# Patient Record
Sex: Male | Born: 1935
Health system: Southern US, Community
[De-identification: ages and names within clinical notes are randomized; demographics above are authoritative.]

## PROBLEM LIST (undated history)

## (undated) DIAGNOSIS — I10 Essential (primary) hypertension: Secondary | ICD-10-CM

## (undated) DIAGNOSIS — G4733 Obstructive sleep apnea (adult) (pediatric): Secondary | ICD-10-CM

## (undated) DIAGNOSIS — I456 Pre-excitation syndrome: Secondary | ICD-10-CM

## (undated) DIAGNOSIS — Z860100 Personal history of colon polyps, unspecified: Secondary | ICD-10-CM

## (undated) DIAGNOSIS — G3184 Mild cognitive impairment, so stated: Secondary | ICD-10-CM

## (undated) DIAGNOSIS — L719 Rosacea, unspecified: Secondary | ICD-10-CM

## (undated) DIAGNOSIS — Z8546 Personal history of malignant neoplasm of prostate: Secondary | ICD-10-CM

## (undated) DIAGNOSIS — G250 Essential tremor: Secondary | ICD-10-CM

## (undated) HISTORY — DX: Rosacea, unspecified: L71.9

## (undated) HISTORY — DX: Personal history of colon polyps, unspecified: Z86.0100

## (undated) HISTORY — DX: Mild cognitive impairment of uncertain or unknown etiology: G31.84

## (undated) HISTORY — DX: Personal history of colonic polyps: Z86.010

## (undated) HISTORY — DX: Pre-excitation syndrome: I45.6

## (undated) HISTORY — DX: Essential (primary) hypertension: I10

## (undated) HISTORY — DX: Personal history of malignant neoplasm of prostate: Z85.46

## (undated) HISTORY — DX: Essential tremor: G25.0

## (undated) HISTORY — PX: TONSILLECTOMY: SHX5217

## (undated) HISTORY — DX: Obstructive sleep apnea (adult) (pediatric): G47.33

---

## 1968-04-23 DIAGNOSIS — I456 Pre-excitation syndrome: Secondary | ICD-10-CM

## 1968-04-23 HISTORY — DX: Pre-excitation syndrome: I45.6

## 2005-10-03 ENCOUNTER — Encounter: Payer: Self-pay | Admitting: Internal Medicine

## 2006-03-19 ENCOUNTER — Encounter: Payer: Self-pay | Admitting: Internal Medicine

## 2007-05-25 ENCOUNTER — Encounter: Payer: Self-pay | Admitting: Internal Medicine

## 2007-06-09 ENCOUNTER — Ambulatory Visit: Payer: Self-pay | Admitting: Internal Medicine

## 2007-06-09 DIAGNOSIS — Z8546 Personal history of malignant neoplasm of prostate: Secondary | ICD-10-CM

## 2007-06-09 DIAGNOSIS — I456 Pre-excitation syndrome: Secondary | ICD-10-CM | POA: Insufficient documentation

## 2007-06-09 DIAGNOSIS — G4733 Obstructive sleep apnea (adult) (pediatric): Secondary | ICD-10-CM

## 2007-06-09 DIAGNOSIS — L719 Rosacea, unspecified: Secondary | ICD-10-CM | POA: Insufficient documentation

## 2007-06-09 DIAGNOSIS — I10 Essential (primary) hypertension: Secondary | ICD-10-CM | POA: Insufficient documentation

## 2007-06-12 LAB — CONVERTED CEMR LAB
CO2: 30 meq/L (ref 19–32)
Eosinophils Absolute: 0.1 10*3/uL (ref 0.0–0.6)
Eosinophils Relative: 1.5 % (ref 0.0–5.0)
Glucose, Bld: 90 mg/dL (ref 70–99)
HCT: 42.5 % (ref 39.0–52.0)
Hemoglobin: 14.5 g/dL (ref 13.0–17.0)
Lymphocytes Relative: 30 % (ref 12.0–46.0)
MCV: 95.5 fL (ref 78.0–100.0)
Neutro Abs: 3.1 10*3/uL (ref 1.4–7.7)
Neutrophils Relative %: 56 % (ref 43.0–77.0)
Phosphorus: 3.8 mg/dL (ref 2.3–4.6)
Potassium: 3.8 meq/L (ref 3.5–5.1)
WBC: 5.6 10*3/uL (ref 4.5–10.5)

## 2007-08-14 ENCOUNTER — Telehealth (INDEPENDENT_AMBULATORY_CARE_PROVIDER_SITE_OTHER): Payer: Self-pay | Admitting: *Deleted

## 2007-08-20 ENCOUNTER — Ambulatory Visit: Payer: Self-pay | Admitting: Internal Medicine

## 2007-09-03 ENCOUNTER — Ambulatory Visit: Payer: Self-pay | Admitting: Gastroenterology

## 2007-09-17 ENCOUNTER — Encounter: Payer: Self-pay | Admitting: Gastroenterology

## 2007-09-17 ENCOUNTER — Ambulatory Visit: Payer: Self-pay | Admitting: Gastroenterology

## 2007-09-17 LAB — HM COLONOSCOPY

## 2007-09-18 ENCOUNTER — Encounter: Payer: Self-pay | Admitting: Gastroenterology

## 2007-10-20 ENCOUNTER — Telehealth: Payer: Self-pay | Admitting: Family Medicine

## 2007-10-31 ENCOUNTER — Telehealth (INDEPENDENT_AMBULATORY_CARE_PROVIDER_SITE_OTHER): Payer: Self-pay | Admitting: *Deleted

## 2007-11-18 ENCOUNTER — Telehealth: Payer: Self-pay | Admitting: Internal Medicine

## 2007-12-04 ENCOUNTER — Telehealth: Payer: Self-pay | Admitting: Family Medicine

## 2007-12-15 ENCOUNTER — Telehealth (INDEPENDENT_AMBULATORY_CARE_PROVIDER_SITE_OTHER): Payer: Self-pay | Admitting: *Deleted

## 2007-12-22 ENCOUNTER — Ambulatory Visit: Payer: Self-pay | Admitting: Internal Medicine

## 2008-01-16 ENCOUNTER — Telehealth (INDEPENDENT_AMBULATORY_CARE_PROVIDER_SITE_OTHER): Payer: Self-pay | Admitting: *Deleted

## 2008-01-29 ENCOUNTER — Ambulatory Visit: Payer: Self-pay | Admitting: Family Medicine

## 2008-02-11 ENCOUNTER — Encounter: Payer: Self-pay | Admitting: Family Medicine

## 2008-02-11 ENCOUNTER — Encounter: Payer: Self-pay | Admitting: Internal Medicine

## 2008-03-31 ENCOUNTER — Telehealth: Payer: Self-pay | Admitting: Internal Medicine

## 2008-05-10 ENCOUNTER — Telehealth: Payer: Self-pay | Admitting: Internal Medicine

## 2008-05-12 ENCOUNTER — Encounter: Payer: Self-pay | Admitting: Internal Medicine

## 2008-05-19 ENCOUNTER — Ambulatory Visit: Payer: Self-pay | Admitting: Internal Medicine

## 2008-05-19 DIAGNOSIS — F01B0 Vascular dementia, moderate, without behavioral disturbance, psychotic disturbance, mood disturbance, and anxiety: Secondary | ICD-10-CM | POA: Insufficient documentation

## 2008-05-19 DIAGNOSIS — F01518 Vascular dementia, unspecified severity, with other behavioral disturbance: Secondary | ICD-10-CM | POA: Insufficient documentation

## 2008-05-19 DIAGNOSIS — F0150 Vascular dementia without behavioral disturbance: Secondary | ICD-10-CM

## 2008-05-19 DIAGNOSIS — G250 Essential tremor: Secondary | ICD-10-CM

## 2008-06-17 ENCOUNTER — Telehealth: Payer: Self-pay | Admitting: Internal Medicine

## 2008-06-24 ENCOUNTER — Ambulatory Visit: Payer: Self-pay | Admitting: Internal Medicine

## 2008-07-05 ENCOUNTER — Telehealth: Payer: Self-pay | Admitting: Family Medicine

## 2008-08-06 ENCOUNTER — Telehealth: Payer: Self-pay | Admitting: Internal Medicine

## 2008-09-06 ENCOUNTER — Encounter: Payer: Self-pay | Admitting: Internal Medicine

## 2008-09-06 ENCOUNTER — Telehealth: Payer: Self-pay | Admitting: Internal Medicine

## 2008-09-16 ENCOUNTER — Ambulatory Visit: Payer: Self-pay | Admitting: Internal Medicine

## 2008-09-17 LAB — CONVERTED CEMR LAB
AST: 33 units/L (ref 0–37)
Alkaline Phosphatase: 68 units/L (ref 39–117)
Basophils Absolute: 0 10*3/uL (ref 0.0–0.1)
Basophils Relative: 0.5 % (ref 0.0–3.0)
Bilirubin, Direct: 0.1 mg/dL (ref 0.0–0.3)
Calcium: 9.4 mg/dL (ref 8.4–10.5)
Chloride: 106 meq/L (ref 96–112)
Creatinine, Ser: 1.1 mg/dL (ref 0.4–1.5)
Eosinophils Absolute: 0.2 10*3/uL (ref 0.0–0.7)
Glucose, Bld: 97 mg/dL (ref 70–99)
Lymphocytes Relative: 32.4 % (ref 12.0–46.0)
MCHC: 35.4 g/dL (ref 30.0–36.0)
Monocytes Relative: 9.3 % (ref 3.0–12.0)
Neutrophils Relative %: 55.2 % (ref 43.0–77.0)
Phosphorus: 4.2 mg/dL (ref 2.3–4.6)
RBC: 4.18 M/uL — ABNORMAL LOW (ref 4.22–5.81)
RDW: 13.4 % (ref 11.5–14.6)
Vitamin B-12: 612 pg/mL (ref 211–911)

## 2008-10-11 ENCOUNTER — Ambulatory Visit: Payer: Self-pay | Admitting: Internal Medicine

## 2008-11-11 ENCOUNTER — Telehealth: Payer: Self-pay | Admitting: Internal Medicine

## 2009-01-05 ENCOUNTER — Ambulatory Visit: Payer: Self-pay | Admitting: Otolaryngology

## 2009-01-14 ENCOUNTER — Telehealth: Payer: Self-pay | Admitting: Internal Medicine

## 2009-03-07 ENCOUNTER — Telehealth: Payer: Self-pay | Admitting: Internal Medicine

## 2009-04-04 ENCOUNTER — Ambulatory Visit: Payer: Self-pay | Admitting: Internal Medicine

## 2009-04-27 ENCOUNTER — Encounter: Payer: Self-pay | Admitting: Internal Medicine

## 2009-05-02 ENCOUNTER — Telehealth: Payer: Self-pay | Admitting: Internal Medicine

## 2009-05-13 ENCOUNTER — Encounter: Payer: Self-pay | Admitting: Internal Medicine

## 2009-05-26 ENCOUNTER — Ambulatory Visit: Payer: Self-pay | Admitting: Internal Medicine

## 2009-06-29 ENCOUNTER — Telehealth: Payer: Self-pay | Admitting: Family Medicine

## 2009-07-01 ENCOUNTER — Ambulatory Visit: Payer: Self-pay | Admitting: Family Medicine

## 2009-07-11 ENCOUNTER — Encounter: Payer: Self-pay | Admitting: Family Medicine

## 2009-08-03 ENCOUNTER — Encounter: Payer: Self-pay | Admitting: Family Medicine

## 2009-08-09 ENCOUNTER — Telehealth: Payer: Self-pay | Admitting: Internal Medicine

## 2009-09-26 ENCOUNTER — Ambulatory Visit: Payer: Self-pay | Admitting: Internal Medicine

## 2009-09-26 DIAGNOSIS — L570 Actinic keratosis: Secondary | ICD-10-CM

## 2009-12-02 ENCOUNTER — Telehealth: Payer: Self-pay | Admitting: Internal Medicine

## 2010-02-03 ENCOUNTER — Ambulatory Visit: Payer: Self-pay | Admitting: Internal Medicine

## 2010-02-06 LAB — CONVERTED CEMR LAB
AST: 30 units/L (ref 0–37)
Albumin: 4 g/dL (ref 3.5–5.2)
BUN: 22 mg/dL (ref 6–23)
Basophils Absolute: 0 10*3/uL (ref 0.0–0.1)
CO2: 31 meq/L (ref 19–32)
Chloride: 99 meq/L (ref 96–112)
Eosinophils Absolute: 0.2 10*3/uL (ref 0.0–0.7)
HCT: 41.3 % (ref 39.0–52.0)
Hemoglobin: 14.3 g/dL (ref 13.0–17.0)
Lymphs Abs: 1.8 10*3/uL (ref 0.7–4.0)
MCHC: 34.7 g/dL (ref 30.0–36.0)
MCV: 97.9 fL (ref 78.0–100.0)
Monocytes Absolute: 1 10*3/uL (ref 0.1–1.0)
Neutro Abs: 3.8 10*3/uL (ref 1.4–7.7)
RDW: 14.3 % (ref 11.5–14.6)
TSH: 0.85 microintl units/mL (ref 0.35–5.50)

## 2010-05-23 NOTE — Progress Notes (Signed)
Summary: TRAZODONE  Phone Note Refill Request Message from:  [id number] on December 02, 2009 9:43 AM  Refills Requested: Medication #1:  TRAZODONE HCL 100 MG  TABS 2 nightly   Last Refilled: 11/02/2009 E-Scribe Request    Method Requested: Electronic Initial call taken by: Mervin Hack CMA Duncan Dull),  December 02, 2009 9:43 AM  Follow-up for Phone Call        okay to refill x 1 year Follow-up by: Cindee Salt MD,  December 02, 2009 1:21 PM  Additional Follow-up for Phone Call Additional follow up Details #1::        Rx faxed to pharmacy Additional Follow-up by: DeShannon Smith CMA [facility] Dull),  December 02, 2009 2:51 PM    Prescriptions: TRAZODONE HCL 100 MG  TABS (TRAZODONE HCL) 2 nightly  #60 x 12   Entered by:   Mervin Hack CMA (AAMA)   Authorized by:   Cindee Salt MD   Signed by:   Mervin Hack CMA (AAMA) on 12/02/2009   Method used:   Electronically to        [facility] [address]       [address], [address]       [place], [place]  [address]       Ph: [phone]       Fax: [phone]   RxID:   [id number]

## 2010-05-23 NOTE — Assessment & Plan Note (Signed)
Summary: LEFT LEG PAIN/CLE   Vital Signs:  Patient profile:   77 year old male Height:      67.5 inches Weight:      170.38 pounds BMI:     26.39 Temp:     98.0 degrees F oral Pulse rate:   40 / minute Pulse rhythm:   regular BP sitting:   138 / 62  (left arm) Cuff size:   regular  Vitals Entered By: Linde Gillis CMA [facility] Dull) (July 01, 2009 10:11 AM) CC: left leg pain   History of Present Illness: 77 year old male:  Left leg, lateral thigh.  Swims a lot, may have hyperextended his hip. No fall or trauma.   No prior fracture or operation  Walks his dog, walks over campus and will seldom Hard time walking his dog.  Pain with hip flexion  no groin pain  Allergies: 1)  Lisinopril (Lisinopril)  Past History:  Past medical, surgical, family and social histories (including risk factors) reviewed, and no changes noted (except as noted below).  Past Medical History: Reviewed history from 05/19/2008 and no changes required. Essential tremor Prostate cancer, hx of---RT 2006 Obstructive sleep apnea--on CPAP of 10 Wolff-Parkinson-White syndrome---diagnosed in 1970 Rosacea Hypertension Colonic polyps, hx of Mild cognitive impairment  Past Surgical History: Reviewed history from 06/09/2007 and no changes required. Tonsillectomy  Family History: Reviewed history from 12/22/2007 and no changes required. Dad died @54   MI, CVA. Had tremors Mom died @56  cancer 1 brother died of stroke @80  No aware of other medical history  Social History: Reviewed history from 06/09/2007 and no changes required. 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  Occ memoirs (has been published)  Has living will. Requested DNR so form completed. Requests wife as health care POA. Would accept feeding tube unless vegetative state  Review of Systems       REVIEW OF SYSTEMS  GEN: No systemic  complaints, no fevers, chills, sweats, or other acute illnesses MSK: Detailed in the HPI GI: tolerating PO intake without difficulty Neuro: No numbness, parasthesias, or tingling associated. Otherwise the pertinent positives of the ROS are noted above.    Physical Exam  General:  alert and normal appearance.   Ears:  no external deformities.   Nose:  no external deformity.   Lungs:  normal respiratory effort.   Msk:  right hip, full range of motion in all directions, excellent strength throughout. Nontender throughout on examination.  Left hip: Full range of motion without any groin pain with manipulation. Excellent abduction. No pain with pelvic manipulation. Nontender at the GTB. The external rotation resisted abduction and abduction is normal. Resisted abduction is normal. Nontender along gluteus medius and other  abductors. Nontender at the piriformis musculature.  There's isolated pain at the lateral hip flexors  and that  the TFL   Impression & Recommendations:  Problem # 1:  OTHER JOINT DERANGEMENT NEC PELVIC REGION&THIGH (ICD-718.85) >25 minutes spent in face to face time with patient, >50% spent in counselling or coordination of care: we reviewed the anatomy, rehabilitation process. I encouraged him to return to the pool, he can begin some  multidirectional rehabilitation and resistance in the water. Tivis Ringer have a graded return  to swimming, with very easy strokes  and beginning with something  such as a  backstroke  or other that  has limited  aggressive hip flexion.  I believe this is an isolated grade 1 muscle  injury obtained from  hyperextension injury. I expect he will do well.  The patient lives at [facility], asked their physical therapist to work along with him during his progress.  Orders: Physical Therapy Referral (PT)  Complete Medication List: 1)  Tetracycline Hcl 250 Mg Caps (Tetracycline hcl) .... Take 1 capsule by mouth once a day 2)  Chlorthalidone 25 Mg Tabs  (Chlorthalidone) .... Take 1 tablet by mouth once a day 3)  Lorazepam 0.5 Mg Tabs (Lorazepam) .... Take 1-2 tablets at bedtime as needed to help sleep 4)  Trazodone Hcl 100 Mg Tabs (Trazodone hcl) .... 2 nightly 5)  Adult Aspirin Low Strength 81 Mg Tbdp (Aspirin) .... Take 1 tablet by mouth once a day 6)  Mens Multivitamin Plus Tabs (Multiple vitamins-minerals) .... Take 1 by mouth once daily 7)  Inderal La 80 Mg Xr24h-cap (Propranolol hcl) .Marland Kitchen.. 1 daily 8)  Amlodipine Besylate 10 Mg Tabs (Amlodipine besylate) .Marland Kitchen.. 1 tab daily for high blood pressure  Patient Instructions: 1)  Referral Appointment Information 2)  Day/Date: 3)  Time: 4)  Place/MD: 5)  Address: 6)  Phone/Fax: 7)  Patient given appointment information. Information/Orders faxed/mailed.   Current Allergies (reviewed today): LISINOPRIL (LISINOPRIL)

## 2010-05-23 NOTE — Progress Notes (Signed)
Summary: LORAZEPAM  Phone Note Refill Request Message from:  [phone] on June 29, 2009 8:31 AM  Refills Requested: Medication #1:  LORAZEPAM 0.5 MG  TABS Take 1-2 tablets at bedtime as needed to help sleep   Last Refilled: 05/02/2009 E-Scribe Request for Dr. Alphonsus Sias pt.   Method Requested: Telephone to Pharmacy Initial call taken by: DeShannon Katrinka Blazing CMA [facility] Dull),  June 29, 2009 8:31 AM  Follow-up for Phone Call        Rx called to pharmacy Follow-up by: Mervin Hack CMA [facility] Dull),  June 29, 2009 1:12 PM     Prescriptions: LORAZEPAM 0.5 MG  TABS (LORAZEPAM) Take 1-2 tablets at bedtime as needed to help sleep  #60 x 0   Entered and Authorized by:   Shaune Leeks MD   Signed by:   Shaune Leeks MD on 06/29/2009   Method used:   Telephoned to ...       [facility] [address]       [address]       [place], [place]  [address]       Ph: [phone]       Fax: [phone]   RxID:   [id number]

## 2010-05-23 NOTE — Miscellaneous (Signed)
Summary: PT Orders & Initial Eval/[facility]  PT Orders & Initial Eval/[facility]   Imported By: Lanelle Bal 07/14/2009 13:01:48  _____________________________________________________________________  External Attachment:    Type:   Image     Comment:   External Document

## 2010-05-23 NOTE — Letter (Signed)
Summary: [facility] / NEUROPSYCHOLOGICAL RPT / DR. SARAH COOK  [facility] / NEUROPSYCHOLOGICAL RPT / DR. SARAH COOK   Imported By: Carin Primrose 06/07/2009 14:36:32  _____________________________________________________________________  External Attachment:    Type:   Image     Comment:   External Document

## 2010-05-23 NOTE — Letter (Signed)
Summary: [facility] NP/[facility]  [facility]   Imported By: Sherian Rein 05/13/2009 09:57:20  _____________________________________________________________________  External Attachment:    Type:   Image     Comment:   External Document  Appended Document: [facility] NP/[facility] mild memory loss checking neuropsych testing

## 2010-05-23 NOTE — Miscellaneous (Signed)
Summary: PT Orders/[facility]  PT Orders/[facility]   Imported By: Lanelle Bal 08/09/2009 13:29:23  _____________________________________________________________________  External Attachment:    Type:   Image     Comment:   External Document

## 2010-05-23 NOTE — Assessment & Plan Note (Signed)
Summary: 6 WK F/U DLO   Vital Signs:  Patient profile:   77 year old male Weight:      169 pounds Temp:     97.8 degrees F oral BP sitting:   120 / 68  (left arm) Cuff size:   large  Vitals Entered By: Mervin Hack CMA [facility] Dull) (May 26, 2009 3:47 PM) CC: 6 week follow-up   History of Present Illness: Doing well No problems with increased BP meds doesn't check his BP  No headaches No chest pain  No SOB Still swims regularly---45 minutes at a time Walks dog regularly still  Still with some memory problems No sig change he feels---wife not here yet Still have long cognitive testing at [facility] with Dr Yolanda Bonine Scott---results pending  Tremor well controlled on the meds  Allergies: 1)  Lisinopril (Lisinopril)  Past History:  Past medical, surgical, family and social histories (including risk factors) reviewed for relevance to current acute and chronic problems.  Past Medical History: Reviewed history from 05/19/2008 and no changes required. Essential tremor Prostate cancer, hx of---RT 2006 Obstructive sleep apnea--on CPAP of 10 Wolff-Parkinson-White syndrome---diagnosed in 1970 Rosacea Hypertension Colonic polyps, hx of Mild cognitive impairment  Past Surgical History: Reviewed history from 06/09/2007 and no changes required. Tonsillectomy  Family History: Reviewed history from 12/22/2007 and no changes required. Dad died @54   MI, CVA. Had tremors Mom died @56  cancer 1 brother died of stroke @80  No aware of other medical history  Social History: Reviewed history from 06/09/2007 and no changes required. 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  Occ memoirs (has been published)  Has living will. Requested DNR so form completed. Requests wife as health care POA. Would accept feeding tube unless vegetative state  Review of Systems       sleeping  well appetite is good  weight stable  Physical Exam  General:  alert and normal appearance.   Neck:  supple, no masses, no thyromegaly, no carotid bruits, and no cervical lymphadenopathy.   Lungs:  normal respiratory effort and normal breath sounds.   Heart:  normal rate, regular rhythm, no murmur, and no gallop.   Extremities:  no edema Neurologic:  tremor not evident at rest Psych:  normally interactive, good eye contact, not anxious appearing, and not depressed appearing.     Impression & Recommendations:  Problem # 1:  HYPERTENSION (ICD-401.9) Assessment Improved much better continue current meds  His updated medication list for this problem includes:    Chlorthalidone 25 Mg Tabs (Chlorthalidone) .Marland Kitchen... Take 1 tablet by mouth once a day    Inderal La 80 Mg Xr24h-cap (Propranolol hcl) .Marland Kitchen... 1 daily    Amlodipine Besylate 10 Mg Tabs (Amlodipine besylate) .Marland Kitchen... 1 tab daily for high blood pressure  BP today: 120/68 Prior BP: 200/80 (04/04/2009)  Labs Reviewed: K+: 3.9 (09/16/2008) Creat: : 1.1 (09/16/2008)     Problem # 2:  MILD COGNITIVE IMPAIRMENT SO STATED (ICD-331.83) Assessment: Comment Only will await results of recent testing  Problem # 3:  TREMOR, ESSENTIAL (ICD-333.1) Assessment: Unchanged good control  Complete Medication List: 1)  Tetracycline Hcl 250 Mg Caps (Tetracycline hcl) .... Take 1 capsule by mouth once a day 2)  Chlorthalidone 25 Mg Tabs (Chlorthalidone) .... Take 1 tablet by mouth once a day 3)  Lorazepam 0.5 Mg Tabs (Lorazepam) .... Take 1-2 tablets at bedtime as needed to help sleep 4)  Trazodone Hcl 100 Mg Tabs (  Trazodone hcl) .... 2 nightly 5)  Adult Aspirin Low Strength 81 Mg Tbdp (Aspirin) .... Take 1 tablet by mouth once a day 6)  Mens Multivitamin Plus Tabs (Multiple vitamins-minerals) .... Take 1 by mouth once daily 7)  Inderal La 80 Mg Xr24h-cap (Propranolol hcl) .Marland Kitchen.. 1 daily 8)  Amlodipine Besylate 10 Mg Tabs (Amlodipine besylate) .Marland Kitchen..  1 tab daily for high blood pressure  Patient Instructions: 1)  Please schedule a follow-up appointment in 4 months .   Current Allergies (reviewed today): LISINOPRIL (LISINOPRIL)

## 2010-05-23 NOTE — Assessment & Plan Note (Signed)
Summary: 4 month follow up/[facility]   Vital Signs:  Patient profile:   77 year old male Weight:      171 pounds Temp:     98.3 degrees F oral Pulse rate:   40 / minute BP sitting:   150 / 80 CC: follow-up visit, Hypertension Management   History of Present Illness: Left leg is all better Done with PT now back to normal walking without any problems  Has skin tags or warts on his back that have recently come up Posterior right shoulder no pain or itching  Memory is about the same did have full testing at [facility] mild cognitive impairment  uses lorazepam just for sleep no nerve problems generally satisfied new great granddaughter and another on the way  tremor is tolerable with current meds only problem is with signing  No dizziness or syncope no palpitation  Hypertension History:      Positive major cardiovascular risk factors include male age 28 years old or older and hypertension.  Negative major cardiovascular risk factors include non-tobacco-user status.    Allergies: 1)  Lisinopril (Lisinopril)  Past History:  Past medical, surgical, family and social histories (including risk factors) reviewed for relevance to current acute and chronic problems.  Past Medical History: Reviewed history from 05/19/2008 and no changes required. Essential tremor Prostate cancer, hx of---RT 2006 Obstructive sleep apnea--on CPAP of 10 Wolff-Parkinson-White syndrome---diagnosed in 1970 Rosacea Hypertension Colonic polyps, hx of Mild cognitive impairment  Past Surgical History: Reviewed history from 06/09/2007 and no changes required. Tonsillectomy  Family History: Reviewed history from 12/22/2007 and no changes required. Dad died @54   MI, CVA. Had tremors Mom died @56  cancer 1 brother died of stroke @80  No aware of other medical history  Social History: Reviewed history from 06/09/2007 and no changes required. 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  Occ memoirs (has been published)  Has living will. Requested DNR so form completed. Requests wife as health care POA. Would accept feeding tube unless vegetative state  Review of Systems       appetite is fine sleeping well weight stable ongoing hearing issues-uses aides  Physical Exam  General:  alert and normal appearance.   Neck:  supple, no masses, no thyromegaly, no carotid bruits, and no cervical lymphadenopathy.   Lungs:  normal respiratory effort and normal breath sounds.   Heart:  regular rhythm, no murmur, no gallop, and bradycardia.   Msk:  no joint tenderness and no joint swelling.   Extremities:  no edema Skin:  actinic on vertex of head cutaneous horn on posterior right shoulder and left flank skin tag left flank Psych:  normally interactive, good eye contact, not anxious appearing, and not depressed appearing.     Impression & Recommendations:  Problem # 1:  MILD COGNITIVE IMPAIRMENT SO STATED (ICD-331.83) Assessment Comment Only stable status no progression of cognitive problems mood fine bradycardia doesn't seem to influence  Problem # 2:  TREMOR, ESSENTIAL (ICD-333.1) Assessment: Comment Only mild symptoms only will try to wean propranolol due to marked bradycardia (upper 30's)  Problem # 3:  SLEEP DISORDER (ICD-780.50) Assessment: Unchanged will have him try without the lorazepam  Problem # 4:  HYPERTENSION (ICD-401.9) Assessment: Unchanged BP fine will decrease the inderal due to bradycardia  The following medications were removed from the medication list:    Inderal La 80 Mg Xr24h-cap (Propranolol hcl) .Marland Kitchen... 1 daily His updated medication list for  this problem includes:    Chlorthalidone 25 Mg Tabs (Chlorthalidone) .Marland Kitchen... Take 1 tablet by mouth once a day    Amlodipine Besylate 10 Mg Tabs (Amlodipine besylate) .Marland Kitchen... 1 tab daily for high blood pressure     Propranolol Hcl Cr 60 Mg Xr24h-cap (Propranolol hcl) .Marland Kitchen... 1 tab daily to control tremor  BP today: 150/80 Prior BP: 138/62 (07/01/2009)  10 Yr Risk Heart Disease: Not enough information  Labs Reviewed: K+: 3.9 (09/16/2008) Creat: : 1.1 (09/16/2008)     Problem # 5:  ACTINIC KERATOSIS (ICD-702.0) Assessment: New liquid nitrogen to the 4 lesions for 40 seconds x 2 tolerated well  Orders: Cryotherapy/Destruction benign or premalignant lesion (1st lesion)  (17000) Cryotherapy/Destruction benign or premalignant lesion (2nd-14th lesions) (17003)  Complete Medication List: 1)  Tetracycline Hcl 250 Mg Caps (Tetracycline hcl) .... Take 1 capsule by mouth once a day 2)  Chlorthalidone 25 Mg Tabs (Chlorthalidone) .... Take 1 tablet by mouth once a day 3)  Lorazepam 0.5 Mg Tabs (Lorazepam) .... Take 1-2 tablets at bedtime as needed to help sleep 4)  Trazodone Hcl 100 Mg Tabs (Trazodone hcl) .... 2 nightly 5)  Adult Aspirin Low Strength 81 Mg Tbdp (Aspirin) .... Take 1 tablet by mouth once a day 6)  Mens Multivitamin Plus Tabs (Multiple vitamins-minerals) .... Take 1 by mouth once daily 7)  Amlodipine Besylate 10 Mg Tabs (Amlodipine besylate) .Marland Kitchen.. 1 tab daily for high blood pressure 8)  Propranolol Hcl Cr 60 Mg Xr24h-cap (Propranolol hcl) .Marland Kitchen.. 1 tab daily to control tremor  Hypertension Assessment/Plan:      The patient's hypertensive risk group is category B: At least one risk factor (excluding diabetes) with no target organ damage.  Today's blood pressure is 150/80.     Patient Instructions: 1)  Please schedule a follow-up appointment in 4 months .  Prescriptions: PROPRANOLOL HCL CR 60 MG XR24H-CAP (PROPRANOLOL HCL) 1 tab daily to control tremor  #30 x 12   Entered and Authorized by:   Cindee Salt MD   Signed by:   Cindee Salt MD on 09/26/2009   Method used:   Electronically to        [facility] [address]       [address]        [place], [place]  [address]       Ph: [phone]       Fax: [phone]   RxID:   [id number]

## 2010-05-23 NOTE — Progress Notes (Signed)
Summary:  LORAZEPAM   Phone Note Refill Request Message from:  [phone] on August 09, 2009 3:33 PM  Refills Requested: Medication #1:  LORAZEPAM 0.5 MG  TABS Take 1-2 tablets at bedtime as needed to help sleep   Last Refilled: 06/29/2009 E-Scribe Request    Method Requested: Telephone to Pharmacy Initial call taken by: Mervin Hack CMA Duncan Dull),  August 09, 2009 3:34 PM  Follow-up for Phone Call        okay #60 x 2 Follow-up by: Cindee Salt MD,  August 10, 2009 8:11 AM  Additional Follow-up for Phone Call Additional follow up Details #1::        Rx called to pharmacy Additional Follow-up by: Linde Gillis CMA [facility] Dull),  August 10, 2009 12:59 PM    Prescriptions: LORAZEPAM 0.5 MG  TABS (LORAZEPAM) Take 1-2 tablets at bedtime as needed to help sleep  #60 x 2   Entered by:   Linde Gillis CMA (AAMA)   Authorized by:   Cindee Salt MD   Signed by:   Linde Gillis CMA (AAMA) on 08/10/2009   Method used:   Telephoned to ...       [facility] [address]       [address], [address]       [place], [place]  [address]       Ph: [phone]       Fax: [phone]   RxID:   [id number]

## 2010-05-23 NOTE — Assessment & Plan Note (Signed)
Summary: ROA FOR 4 MONTH FOLLOW-UP/JRR   Vital Signs:  Patient profile:   77 year old male Weight:      174 pounds Temp:     98.2 degrees F oral Pulse rate:   36 / minute Pulse rhythm:   irregularly irregular BP sitting:   128 / 60  (left arm) Cuff size:   large  Vitals Entered By: Mervin Hack CMA Duncan Dull) (February 03, 2010 10:26 AM) CC: 4 month follow-up   History of Present Illness: DOing well Tries to stay busy--walks a mile a day with dog and occ otherwise still swims laps regularly  Doesn't check BP no headaches No chest pain No SOB or change in exercise tolerance No edema  No sig memory changes Wife had not noted any serious change no loss of function  sleeping weill with just the trazodone hasn't needed the lorazepam   tremor has note been noticeable lately  Allergies: 1)  Lisinopril (Lisinopril)  Past History:  Past medical, surgical, family and social histories (including risk factors) reviewed for relevance to current acute and chronic problems.  Past Medical History: Reviewed history from 05/19/2008 and no changes required. Essential tremor Prostate cancer, hx of---RT 2006 Obstructive sleep apnea--on CPAP of 10 Wolff-Parkinson-White syndrome---diagnosed in 1970 Rosacea Hypertension Colonic polyps, hx of Mild cognitive impairment  Past Surgical History: Reviewed history from 06/09/2007 and no changes required. Tonsillectomy  Family History: Reviewed history from 12/22/2007 and no changes required. Dad died @54   MI, CVA. Had tremors Mom died @56  cancer 1 brother died of stroke @80  No aware of other medical history  Social History: Reviewed history from 06/09/2007 and no changes required. 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  Occ memoirs (has been published)  Has living will. Requested DNR so form completed. Requests wife as  health care POA. Would accept feeding tube unless vegetative state  Review of Systems       appeitte is good weight is up a few punds Just went up in a hot air balloon--really lved it   Physical Exam  General:  alert and normal appearance.   Neck:  supple, no masses, no thyromegaly, no carotid bruits, and no cervical lymphadenopathy.   Lungs:  normal respiratory effort, no intercostal retractions, no accessory muscle use, and normal breath sounds.   Heart:  normal rate, regular rhythm, and no gallop.   Gr 2/6 systolic murmur throughout precordium--sounds aortic Extremities:  no edema Neurologic:  alert & oriented X3, strength normal in all extremities, and gait normal.   Psych:  normally interactive, good eye contact, not anxious appearing, and not depressed appearing.     Impression & Recommendations:  Problem # 1:  MILD COGNITIVE IMPAIRMENT SO STATED (ICD-331.83) Assessment Unchanged stable no action needed  Problem # 2:  HYPERTENSION (ICD-401.9) Assessment: Unchanged  good control no changes needed  His updated medication list for this problem includes:    Chlorthalidone 25 Mg Tabs (Chlorthalidone) .Marland Kitchen... Take 1 tablet by mouth once a day    Amlodipine Besylate 10 Mg Tabs (Amlodipine besylate) .Marland Kitchen... 1 tab daily for high blood pressure    Propranolol Hcl Cr 60 Mg Xr24h-cap (Propranolol hcl) .Marland Kitchen... 1 tab daily to control tremor  BP today: 128/60 Prior BP: 150/80 (09/26/2009)  Prior 10 Yr Risk Heart Disease: Not enough information (09/26/2009)  Labs Reviewed: K+: 3.9 (09/16/2008) Creat: : 1.1 (09/16/2008)     Orders: TLB-Renal Function Panel (80069-RENAL) TLB-CBC Platelet -  w/Differential (85025-CBCD) TLB-Hepatic/Liver Function Pnl (80076-HEPATIC) TLB-TSH (Thyroid Stimulating Hormone) (84443-TSH) Venipuncture ([address])  Problem # 3:  TREMOR, ESSENTIAL (ICD-333.1) Assessment: Unchanged no recent problems on the propranolol  Problem # 4:  SLEEP DISORDER  (ICD-780.50) Assessment: Improved not just using the trazodone  Complete Medication List: 1)  Tetracycline Hcl 250 Mg Caps (Tetracycline hcl) .... Take 1 capsule by mouth once a day 2)  Chlorthalidone 25 Mg Tabs (Chlorthalidone) .... Take 1 tablet by mouth once a day 3)  Trazodone Hcl 100 Mg Tabs (Trazodone hcl) .... 2 nightly 4)  Amlodipine Besylate 10 Mg Tabs (Amlodipine besylate) .Marland Kitchen.. 1 tab daily for high blood pressure 5)  Propranolol Hcl Cr 60 Mg Xr24h-cap (Propranolol hcl) .Marland Kitchen.. 1 tab daily to control tremor 6)  Adult Aspirin Low Strength 81 Mg Tbdp (Aspirin) .... Take 1 tablet by mouth once a day 7)  Mens Multivitamin Plus Tabs (Multiple vitamins-minerals) .... Take 1 by mouth once daily  Patient Instructions: 1)  Please schedule a follow-up appointment in 6 months .   Current Allergies (reviewed today): LISINOPRIL (LISINOPRIL)

## 2010-05-23 NOTE — Progress Notes (Signed)
Summary: LORAZEPAM  Phone Note Refill Request Message from:  [id number] on May 02, 2009 9:00 AM  Refills Requested: Medication #1:  LORAZEPAM 0.5 MG  TABS Take 1-2 tablets at bedtime as needed to help sleep   Last Refilled: 03/07/2009 E-Scribe Request    Method Requested: Electronic Initial call taken by: Mervin Hack CMA Duncan Dull),  May 02, 2009 9:00 AM  Follow-up for Phone Call        okay #60 x 0 Follow-up by: Cindee Salt MD,  May 02, 2009 1:22 PM  Additional Follow-up for Phone Call Additional follow up Details #1::        Rx called to pharmacy Additional Follow-up by: DeShannon Katrinka Blazing CMA Duncan Dull),  May 02, 2009 2:54 PM    Prescriptions: LORAZEPAM 0.5 MG  TABS (LORAZEPAM) Take 1-2 tablets at bedtime as needed to help sleep  #60 x 0   Entered by:   Mervin Hack CMA (AAMA)   Authorized by:   Cindee Salt MD   Signed by:   Mervin Hack CMA (AAMA) on 05/02/2009   Method used:   Telephoned to ...       [facility] [address] ([facility]ail)       [address]       [place], [place]  [address]       Ph: [phone]       Fax: [phone]   RxID:   [id number]

## 2010-06-02 ENCOUNTER — Telehealth: Payer: Self-pay | Admitting: Internal Medicine

## 2010-06-08 NOTE — Progress Notes (Signed)
Summary: need to change from tetracycline  Phone Note From Pharmacy   Caller: [facility] [address] Summary of Call: Fax from pharmacy is on your desk.  Tetracycline is no longer made, asking to change to something else. Initial call taken by: Lowella Petties CMA, AAMA,  June 02, 2010 8:28 AM  Follow-up for Phone Call        will change to low dose doxy Follow-up by: Cindee Salt MD,  June 02, 2010 1:41 PM    New/Updated Medications: DOXYCYCLINE HYCLATE 50 MG CAPS (DOXYCYCLINE HYCLATE) 1 tab daily for rosacea Prescriptions: DOXYCYCLINE HYCLATE 50 MG CAPS (DOXYCYCLINE HYCLATE) 1 tab daily for rosacea  #30 x 11   Entered and Authorized by:   Cindee Salt MD   Signed by:   Cindee Salt MD on 06/02/2010   Method used:   Electronically to        [facility] [address]ail)       [address], [address]       [place], [place]  [address]       Ph: [phone]       Fax: [phone]   RxID:   [id number]

## 2010-09-04 ENCOUNTER — Encounter: Payer: Self-pay | Admitting: Internal Medicine

## 2010-09-04 ENCOUNTER — Ambulatory Visit (INDEPENDENT_AMBULATORY_CARE_PROVIDER_SITE_OTHER): Payer: Medicare Other | Admitting: Internal Medicine

## 2010-09-04 VITALS — BP 161/55 | HR 45 | Temp 98.7°F | Ht 67.5 in | Wt 174.0 lb

## 2010-09-04 DIAGNOSIS — I498 Other specified cardiac arrhythmias: Secondary | ICD-10-CM

## 2010-09-04 DIAGNOSIS — G252 Other specified forms of tremor: Secondary | ICD-10-CM

## 2010-09-04 DIAGNOSIS — R001 Bradycardia, unspecified: Secondary | ICD-10-CM | POA: Insufficient documentation

## 2010-09-04 DIAGNOSIS — I10 Essential (primary) hypertension: Secondary | ICD-10-CM

## 2010-09-04 DIAGNOSIS — G250 Essential tremor: Secondary | ICD-10-CM

## 2010-09-04 MED ORDER — PROPRANOLOL HCL 20 MG PO TABS: 20.0000 mg | ORAL_TABLET | Freq: Two times a day (BID) | ORAL | Status: DC

## 2010-09-04 NOTE — Progress Notes (Signed)
Subjective:    Patient ID: Michael Kane, male    DOB: [birth year], 77 y.o.   MRN: [id number]  HPI Michael Kane to [facility] for usual prostate cancer follow up BP was low and heart rate was 32 They told him he needed to be seen here today  Did have slight dizzy spell a few days ago No syncope No chest pain No SOB No changes in exercise tolerance  Tremor has been controlled No recent restrictions  BP generally fine ??systolic 120 today  Current outpatient prescriptions:amLODipine (NORVASC) 10 MG tablet, Take 10 mg by mouth daily.  , Disp: , Rfl: ;  aspirin 81 MG tablet, Take 81 mg by mouth daily.  , Disp: , Rfl: ;  chlorthalidone (HYGROTON) 25 MG tablet, Take 25 mg by mouth daily.  , Disp: , Rfl: ;  doxycycline (VIBRAMYCIN) 50 MG capsule, Take 50 mg by mouth daily.  , Disp: , Rfl: ;  Multiple Vitamins-Minerals (MENS MULTI VITAMIN & MINERAL PO), Take by mouth daily.  , Disp: , Rfl:  propranolol (INDERAL) 60 MG tablet, Take 60 mg by mouth daily.  , Disp: , Rfl: ;  traZODone (DESYREL) 100 MG tablet, Take 100 mg by mouth at bedtime.  , Disp: , Rfl:   Past Medical History  Diagnosis Date  . Tremor, essential   . History of prostate cancer     RT 2006  . OSA (obstructive sleep apnea)     on CPAP of 10  . Wolff-Parkinson-White syndrome 1970  . Rosacea   . Hypertension   . Hx of colonic polyps   . Mild cognitive impairment     Past Surgical History  Procedure Date  . Tonsillectomy     Family History  Problem Relation Age of Onset  . Stroke Brother     History   Social History  . Marital Status: Married    Spouse Name: N/A    Number of Children: 3  . Years of Education: N/A   Occupational History  . retired Recruitment consultant    Social History Main Topics  . Smoking status: Former Smoker    Types: Cigarettes    Quit date: 04/23/1958  . Smokeless tobacco: Not on file  . Alcohol Use: Yes     1 beer daily  . Drug Use: Not on file  . Sexually Active: Not on file   Other  Topics Concern  . Not on file   Social History Narrative   Wites poetry and short stories.  Occ memoirs (has been published)Has living will. Requested DNR so form completed. Requests wife as health care POA. Would accept feeding tube unless vegetative state   Review of Systems Eating fine Weight stable Still swims regularly     Objective:   Physical Exam  Constitutional: He appears well-developed and well-nourished. No distress.  Neck: Normal range of motion. Neck supple. No thyromegaly present.       No carotid bruits  Cardiovascular: Regular rhythm and intact distal pulses.  Exam reveals no gallop.   Murmur heard.      Soft systolic murmur  Pulmonary/Chest: Effort normal and breath sounds normal. No respiratory distress. He has no wheezes. He has no rales.  Abdominal: There is no tenderness.  Musculoskeletal: Normal range of motion. He exhibits no edema and no tenderness.  Lymphadenopathy:    He has no cervical adenopathy.  Psychiatric: He has a normal mood and affect. His behavior is normal. Judgment and thought content normal.  Assessment & Plan:

## 2010-09-04 NOTE — Patient Instructions (Signed)
Please stop the propranolol 60mg  and start the 20mg  twice a day Please call if the tremor is really bad, or if you have any fatigue, dizziness, etc

## 2010-09-13 ENCOUNTER — Telehealth: Payer: Self-pay | Admitting: *Deleted

## 2010-09-13 NOTE — Telephone Encounter (Signed)
If his tremor is not worse on the decreased dose of propranolol, he should stop it completely due to the low heart rate still BP is okay--I don't think it will go up much off the propranolol

## 2010-09-13 NOTE — Telephone Encounter (Signed)
Left message to have daughter return my call. 

## 2010-09-13 NOTE — Telephone Encounter (Signed)
Daughter says that patient's blood pressure is 137/57 and pulse is 40. She says that he has only been taking the propranolol 20 once daily. Daughter is asking if he needs to continue with 1 daily or start taking 2 daily because she is concerned that his pulse is low. Please advise.

## 2010-09-19 ENCOUNTER — Telehealth: Payer: Self-pay | Admitting: *Deleted

## 2010-09-19 ENCOUNTER — Encounter: Payer: Self-pay | Admitting: Internal Medicine

## 2010-09-19 NOTE — Telephone Encounter (Signed)
Pt called earlier today, asked if ok if the got the pertussis vaccine at [facility], advised yes.  It's been 5 years since his last tetanus so I told him he could use the booster.

## 2010-09-20 ENCOUNTER — Ambulatory Visit (INDEPENDENT_AMBULATORY_CARE_PROVIDER_SITE_OTHER): Payer: Medicare Other | Admitting: Internal Medicine

## 2010-09-20 ENCOUNTER — Encounter: Payer: Self-pay | Admitting: Internal Medicine

## 2010-09-20 VITALS — BP 158/60 | HR 45 | Temp 98.5°F | Ht 67.5 in | Wt 169.0 lb

## 2010-09-20 DIAGNOSIS — R001 Bradycardia, unspecified: Secondary | ICD-10-CM

## 2010-09-20 DIAGNOSIS — I498 Other specified cardiac arrhythmias: Secondary | ICD-10-CM

## 2010-09-20 DIAGNOSIS — G250 Essential tremor: Secondary | ICD-10-CM

## 2010-09-20 NOTE — Patient Instructions (Signed)
Stop all propranolol and discard---you will not be taking this again

## 2010-09-20 NOTE — Progress Notes (Signed)
  Subjective:    Patient ID: Michael Kane, male    DOB: Mar 30, 1929, 77 y.o.   MRN: [id number]  HPI "I feel great" No dizziness or fainting spells Good energy levels  HR still low 36-51 He notes no change over 20 years BP 130-148/~50  No edema No chest pain No SOB  He is not sure of the propranolol dose May still be taking one a day  Still walks a lot Swims laps regularly No problems with tremor  Current outpatient prescriptions:amLODipine (NORVASC) 10 MG tablet, Take 10 mg by mouth daily.  , Disp: , Rfl: ;  aspirin 81 MG tablet, Take 81 mg by mouth daily.  , Disp: , Rfl: ;  chlorthalidone (HYGROTON) 25 MG tablet, Take 25 mg by mouth daily.  , Disp: , Rfl: ;  doxycycline (VIBRAMYCIN) 50 MG capsule, Take 50 mg by mouth daily.  , Disp: , Rfl: ;  Multiple Vitamins-Minerals (MENS MULTI VITAMIN & MINERAL PO), Take by mouth daily.  , Disp: , Rfl:  traZODone (DESYREL) 100 MG tablet, Take 100 mg by mouth at bedtime.  , Disp: , Rfl: ;  DISCONTD: propranolol (INDERAL) 20 MG tablet, Take 1 tablet (20 mg total) by mouth 2 (two) times daily., Disp: 60 tablet, Rfl: 11  Past Medical History  Diagnosis Date  . Tremor, essential   . History of prostate cancer     RT 2006  . OSA (obstructive sleep apnea)     on CPAP of 10  . Wolff-Parkinson-White syndrome 1970  . Rosacea   . Hypertension   . Hx of colonic polyps   . Mild cognitive impairment     Past Surgical History  Procedure Date  . Tonsillectomy     Family History  Problem Relation Age of Onset  . Stroke Brother     History   Social History  . Marital Status: Married    Spouse Name: N/A    Number of Children: 3  . Years of Education: N/A   Occupational History  . retired Recruitment consultant    Social History Main Topics  . Smoking status: Former Smoker    Types: Cigarettes    Quit date: 04/23/1958  . Smokeless tobacco: Not on file  . Alcohol Use: Yes     1 beer daily  . Drug Use: Not on file  . Sexually Active:  Not on file   Other Topics Concern  . Not on file   Social History Narrative   Wites poetry and short stories.  Occ memoirs (has been published)Has living will. Requested DNR so form completed. Requests wife as health care POA. Would accept feeding tube unless vegetative state    Review of Systems Appetite is good Weight fairly stable    Objective:   Physical Exam  Constitutional: He appears well-developed and well-nourished.  Neck: Normal range of motion. Neck supple. No thyromegaly present.  Cardiovascular: Regular rhythm and intact distal pulses.  Exam reveals no gallop.   Murmur heard.      Bradycardia Slight systolic murmur at left upper sternal border  Pulmonary/Chest: Effort normal. No respiratory distress. He has no wheezes. He has no rales.  Musculoskeletal: Normal range of motion. He exhibits no edema and no tenderness.  Neurological:       Very slight resting tremor in hands          Assessment & Plan:

## 2010-09-20 NOTE — Telephone Encounter (Signed)
That is correct 

## 2010-09-25 NOTE — Telephone Encounter (Signed)
Daughter still not calling back

## 2010-12-11 ENCOUNTER — Other Ambulatory Visit: Payer: Self-pay | Admitting: Internal Medicine

## 2010-12-11 NOTE — Telephone Encounter (Signed)
Received refill request electronically from pharmacy. Is it okay to refill medication? 

## 2010-12-12 NOTE — Telephone Encounter (Signed)
Rx sent electronically.  

## 2010-12-20 ENCOUNTER — Ambulatory Visit (INDEPENDENT_AMBULATORY_CARE_PROVIDER_SITE_OTHER): Payer: Medicare Other | Admitting: Internal Medicine

## 2010-12-20 ENCOUNTER — Encounter: Payer: Self-pay | Admitting: Internal Medicine

## 2010-12-20 VITALS — BP 146/59 | HR 59 | Ht 67.0 in | Wt 168.0 lb

## 2010-12-20 DIAGNOSIS — G250 Essential tremor: Secondary | ICD-10-CM

## 2010-12-20 DIAGNOSIS — I10 Essential (primary) hypertension: Secondary | ICD-10-CM

## 2010-12-20 DIAGNOSIS — G3184 Mild cognitive impairment, so stated: Secondary | ICD-10-CM

## 2010-12-20 DIAGNOSIS — G252 Other specified forms of tremor: Secondary | ICD-10-CM

## 2010-12-20 DIAGNOSIS — G479 Sleep disorder, unspecified: Secondary | ICD-10-CM

## 2010-12-20 LAB — CBC WITH DIFFERENTIAL/PLATELET
Eosinophils Relative: 5 % (ref 0.0–5.0)
Monocytes Relative: 12.3 % — ABNORMAL HIGH (ref 3.0–12.0)
Neutrophils Relative %: 58.2 % (ref 43.0–77.0)
Platelets: 230 10*3/uL (ref 150.0–400.0)
WBC: 6.8 10*3/uL (ref 4.5–10.5)

## 2010-12-20 LAB — BASIC METABOLIC PANEL
BUN: 16 mg/dL (ref 6–23)
Creatinine, Ser: 0.9 mg/dL (ref 0.4–1.5)
GFR: 88.61 mL/min (ref >60.00)
Potassium: 4.3 mEq/L (ref 3.5–5.1)

## 2010-12-20 LAB — HEPATIC FUNCTION PANEL
Albumin: 4.2 g/dL (ref 3.5–5.2)
Alkaline Phosphatase: 64 U/L (ref 39–117)
Total Protein: 7.3 g/dL (ref 6.0–8.3)

## 2010-12-20 NOTE — Assessment & Plan Note (Signed)
BP Readings from Last 3 Encounters:  12/20/10 146/59  09/20/10 158/60  09/04/10 161/55   Still about the same off propranolol Will check labs No changes

## 2010-12-20 NOTE — Assessment & Plan Note (Signed)
Symptomatically worse Discussed mysoline vs low dose propranolol again Never was symptomatic with his bradycardia and worry about his mild cognitive problems with the mysoline Will try short acting propranolol 20mg  daily or bid during the day for symptom relief

## 2010-12-20 NOTE — Progress Notes (Signed)
Subjective:    Patient ID: Michael Kane, male    DOB: [birth year], 77 y.o.   MRN: [id number]  HPI Has been off the propranolol Notes more problems with the tremors Wonders about restarting low dose propranolol for daytime  No dizziness or syncope HR at home is regular and usually in low 50's Still swims regularly Walks also No change in stamina  Takes the doxy for his rosacea Has had some squamous carcinomas removed by Dr Cheree Ditto in [facility] recently on vertex  Sleeping well with trazodone No depression  Current Outpatient Prescriptions on File Prior to Visit  Medication Sig Dispense Refill  . amLODipine (NORVASC) 10 MG tablet Take 10 mg by mouth daily.        Marland Kitchen aspirin 81 MG tablet Take 81 mg by mouth daily.        . chlorthalidone (HYGROTON) 25 MG tablet Take 25 mg by mouth daily.        Marland Kitchen doxycycline (VIBRAMYCIN) 50 MG capsule Take 50 mg by mouth daily.        . Multiple Vitamins-Minerals (MENS MULTI VITAMIN & MINERAL PO) Take by mouth daily.        . traZODone (DESYREL) 100 MG tablet TAKE TWO TABLETS BY MOUTH NIGHTLY  60 tablet  11    Allergies  Allergen Reactions  . Lisinopril     REACTION: cough    Past Medical History  Diagnosis Date  . Tremor, essential   . History of prostate cancer     RT 2006  . OSA (obstructive sleep apnea)     on CPAP of 10  . Wolff-Parkinson-White syndrome 1970  . Rosacea   . Hypertension   . Hx of colonic polyps   . Mild cognitive impairment     Past Surgical History  Procedure Date  . Tonsillectomy     Family History  Problem Relation Age of Onset  . Stroke Brother     History   Social History  . Marital Status: Married    Spouse Name: N/A    Number of Children: 3  . Years of Education: N/A   Occupational History  . retired Recruitment consultant    Social History Main Topics  . Smoking status: Former Smoker    Types: Cigarettes    Quit date: 04/23/1958  . Smokeless tobacco: Never Used  . Alcohol Use: Yes       1 beer daily  . Drug Use: Not on file  . Sexually Active: Not on file   Other Topics Concern  . Not on file   Social History Narrative   Wites poetry and short stories.  Occ memoirs (has been published)Has living will. Requested DNR so form completed. Requests wife as health care POA. Would accept feeding tube unless vegetative state   Review of Systems Appetite is good--tries to be careful Weight is stable     Objective:   Physical Exam  Constitutional: He appears well-developed and well-nourished. No distress.  Neck: Normal range of motion. Neck supple. No thyromegaly present.  Cardiovascular: Normal rate and regular rhythm.  Exam reveals no gallop.   Murmur heard.      Rate ~66 now Gr 3/6 systolic murmur  Pulmonary/Chest: Effort normal and breath sounds normal. No respiratory distress. He has no wheezes. He has no rales.  Abdominal: Soft. There is no tenderness.  Musculoskeletal: Normal range of motion. He exhibits no edema and no tenderness.  Lymphadenopathy:    He has no cervical adenopathy.  Neurological:       Mild resting tremor mostly in hands  Psychiatric: He has a normal mood and affect. His behavior is normal. Judgment and thought content normal.          Assessment & Plan:

## 2010-12-20 NOTE — Assessment & Plan Note (Signed)
Seems stable 

## 2010-12-20 NOTE — Assessment & Plan Note (Signed)
Does well with the trazodone 

## 2011-02-12 ENCOUNTER — Other Ambulatory Visit: Payer: Self-pay | Admitting: Internal Medicine

## 2011-05-17 ENCOUNTER — Other Ambulatory Visit: Payer: Self-pay | Admitting: Internal Medicine

## 2011-05-31 ENCOUNTER — Encounter: Payer: Self-pay | Admitting: Internal Medicine

## 2011-05-31 ENCOUNTER — Ambulatory Visit (INDEPENDENT_AMBULATORY_CARE_PROVIDER_SITE_OTHER): Payer: Medicare Other | Admitting: Internal Medicine

## 2011-05-31 DIAGNOSIS — I498 Other specified cardiac arrhythmias: Secondary | ICD-10-CM | POA: Diagnosis not present

## 2011-05-31 DIAGNOSIS — R42 Dizziness and giddiness: Secondary | ICD-10-CM | POA: Diagnosis not present

## 2011-05-31 DIAGNOSIS — G252 Other specified forms of tremor: Secondary | ICD-10-CM

## 2011-05-31 DIAGNOSIS — G250 Essential tremor: Secondary | ICD-10-CM | POA: Diagnosis not present

## 2011-05-31 DIAGNOSIS — R001 Bradycardia, unspecified: Secondary | ICD-10-CM

## 2011-05-31 NOTE — Assessment & Plan Note (Signed)
Still fairly striking though not new for him Will stop since it could be a factor in today's event

## 2011-05-31 NOTE — Assessment & Plan Note (Signed)
Fairly severe this morning but better now Probably vestibular but could be transient ischemia in vertebral artery distribution Continue aspirin Will stop the propranolol

## 2011-05-31 NOTE — Progress Notes (Signed)
Subjective:    Patient ID: Michael Kane, male    DOB: 1928-09-29, 78 y.o.   MRN: [id number]  HPI Had severe case of dizziness he noted this AM Room was spinning and "tilted" Trouble getting dressed and up Finally able to get dressed then just sat in chair Then saw [facility] See her note  No headaches No diplopia or unilateral vision loss No speech or swallowing problems No weakness  Spinning sensation did ease up Feels a little woozy now Never had attack like this before  Back on the 20mg  propranolol again Not sure how much good it is doing for tremor  Current Outpatient Prescriptions on File Prior to Visit  Medication Sig Dispense Refill  . amLODipine (NORVASC) 10 MG tablet TAKE ONE TABLET BY MOUTH EVERY DAY FOR HIGH BLOOD PRESSURE  30 tablet  11  . aspirin 81 MG tablet Take 81 mg by mouth daily.        . chlorthalidone (HYGROTON) 25 MG tablet TAKE ONE TABLET BY MOUTH EVERY DAY  90 tablet  3  . doxycycline (VIBRAMYCIN) 50 MG capsule Take 50 mg by mouth daily.        . Multiple Vitamins-Minerals (MENS MULTI VITAMIN & MINERAL PO) Take by mouth daily.        . propranolol (INDERAL) 20 MG tablet Take 20 mg by mouth 2 (two) times daily as needed. For tremor       . traZODone (DESYREL) 100 MG tablet TAKE TWO TABLETS BY MOUTH NIGHTLY  60 tablet  11    Allergies  Allergen Reactions  . Lisinopril     REACTION: cough    Past Medical History  Diagnosis Date  . Tremor, essential   . History of prostate cancer     RT 2006  . OSA (obstructive sleep apnea)     on CPAP of 10  . Wolff-Parkinson-White syndrome 1970  . Rosacea   . Hypertension   . Hx of colonic polyps   . Mild cognitive impairment     Past Surgical History  Procedure Date  . Tonsillectomy     Family History  Problem Relation Age of Onset  . Stroke Brother     History   Social History  . Marital Status: Married    Spouse Name: N/A    Number of Children: 3  . Years of Education: N/A   Occupational  History  . retired Recruitment consultant    Social History Main Topics  . Smoking status: Former Smoker    Types: Cigarettes    Quit date: 04/23/1958  . Smokeless tobacco: Never Used  . Alcohol Use: Yes     1 beer daily  . Drug Use: Not on file  . Sexually Active: Not on file   Other Topics Concern  . Not on file   Social History Narrative   Wites poetry and short stories.  Occ memoirs (has been published)Has living will. Requested DNR so form completed. Requests wife as health care POA. Would accept feeding tube unless vegetative state   Review of Systems No nausea No tinnitus No change in hearing    Objective:   Physical Exam  Constitutional: He appears well-developed and well-nourished. No distress.  HENT:  Mouth/Throat: Oropharynx is clear and moist. No oropharyngeal exudate.  Eyes: Conjunctivae and EOM are normal. Pupils are equal, round, and reactive to light.       No nystagmus  Neck: Normal range of motion. Neck supple. No thyromegaly present.  Cardiovascular:  Regular rhythm.  Exam reveals no gallop.   Murmur heard.      Stable gr 2/6 aortic systolic murmur Sig bradycardia  Pulmonary/Chest: Effort normal and breath sounds normal. No respiratory distress. He has no wheezes. He has no rales.  Musculoskeletal: He exhibits no edema and no tenderness.  Lymphadenopathy:    He has no cervical adenopathy.  Neurological: He is alert. He has normal strength. He displays tremor. He displays no atrophy. No cranial nerve deficit. He exhibits normal muscle tone. He displays a negative Romberg sign. Coordination and gait normal.       Mild tremor with finger to nose testing  Psychiatric: He has a normal mood and affect. His behavior is normal. Judgment and thought content normal.          Assessment & Plan:

## 2011-05-31 NOTE — Patient Instructions (Signed)
Please stop the propranolol

## 2011-05-31 NOTE — Assessment & Plan Note (Signed)
Not clear if the propranolol is helping  Will stop

## 2011-06-26 DIAGNOSIS — L570 Actinic keratosis: Secondary | ICD-10-CM | POA: Diagnosis not present

## 2011-06-26 DIAGNOSIS — Z85828 Personal history of other malignant neoplasm of skin: Secondary | ICD-10-CM | POA: Diagnosis not present

## 2011-06-29 ENCOUNTER — Other Ambulatory Visit: Payer: Self-pay | Admitting: Internal Medicine

## 2011-08-06 ENCOUNTER — Telehealth: Payer: Self-pay

## 2011-08-06 NOTE — Telephone Encounter (Signed)
Pt walked in with copy of letter from pulmonologist and form done in 2004 for cpap. Pt said needs new Cpap and pulmonologist has moved several times and pt does not know how to get in touch with her. Pt said the settings on the form are still doing well for pt's sleep apnea. Pt said [facility] is going to send forms to Dr Alphonsus Sias for renewal of his cpap information. Pt last saw Dr Alphonsus Sias on 05/31/11. I explained to pt he may need testing for sleep apnea since it has been 10 years but Geraldine Contras will ck with Dr Alphonsus Sias. Pt can be reached at [phone].

## 2011-08-06 NOTE — Telephone Encounter (Signed)
Dr. Alphonsus Sias please see previous order forms on your desk.

## 2011-08-06 NOTE — Telephone Encounter (Signed)
I don't see any CPAP info on my desk I am okay with signing for his equipment , as long as the insurance doesn't require another test. I need to know the pressure he has it set on (see if you can locate the info---I thought I saw something last week but definitely not today)

## 2011-08-07 NOTE — Telephone Encounter (Signed)
Spoke with patient and advised results   

## 2011-08-30 ENCOUNTER — Ambulatory Visit: Payer: Medicare Other | Admitting: Internal Medicine

## 2011-09-04 ENCOUNTER — Encounter: Payer: Self-pay | Admitting: Internal Medicine

## 2011-09-04 ENCOUNTER — Ambulatory Visit (INDEPENDENT_AMBULATORY_CARE_PROVIDER_SITE_OTHER): Payer: Medicare Other | Admitting: Internal Medicine

## 2011-09-04 VITALS — BP 140/70 | HR 40 | Temp 98.4°F | Ht 67.0 in | Wt 170.0 lb

## 2011-09-04 DIAGNOSIS — I498 Other specified cardiac arrhythmias: Secondary | ICD-10-CM

## 2011-09-04 DIAGNOSIS — L719 Rosacea, unspecified: Secondary | ICD-10-CM | POA: Diagnosis not present

## 2011-09-04 DIAGNOSIS — Z8546 Personal history of malignant neoplasm of prostate: Secondary | ICD-10-CM

## 2011-09-04 DIAGNOSIS — G252 Other specified forms of tremor: Secondary | ICD-10-CM

## 2011-09-04 DIAGNOSIS — I10 Essential (primary) hypertension: Secondary | ICD-10-CM

## 2011-09-04 DIAGNOSIS — G250 Essential tremor: Secondary | ICD-10-CM

## 2011-09-04 DIAGNOSIS — G3184 Mild cognitive impairment, so stated: Secondary | ICD-10-CM

## 2011-09-04 DIAGNOSIS — R001 Bradycardia, unspecified: Secondary | ICD-10-CM

## 2011-09-04 LAB — CBC WITH DIFFERENTIAL/PLATELET
Basophils Relative: 0.4 % (ref 0.0–3.0)
Eosinophils Relative: 4.2 % (ref 0.0–5.0)
Lymphocytes Relative: 31.9 % (ref 12.0–46.0)
Neutrophils Relative %: 48.9 % (ref 43.0–77.0)
RBC: 4.16 Mil/uL — ABNORMAL LOW (ref 4.22–5.81)
WBC: 5.1 10*3/uL (ref 4.5–10.5)

## 2011-09-04 LAB — BASIC METABOLIC PANEL WITH GFR
BUN: 19 mg/dL (ref 6–23)
CO2: 29 meq/L (ref 19–32)
Calcium: 9.2 mg/dL (ref 8.4–10.5)
Chloride: 99 meq/L (ref 96–112)
Creatinine, Ser: 0.9 mg/dL (ref 0.4–1.5)
GFR: 85.1 mL/min (ref >60.00)
Glucose, Bld: 82 mg/dL (ref 70–99)
Potassium: 4 meq/L (ref 3.5–5.1)
Sodium: 137 meq/L (ref 135–145)

## 2011-09-04 LAB — HEPATIC FUNCTION PANEL
AST: 31 U/L (ref 0–37)
Alkaline Phosphatase: 61 U/L (ref 39–117)
Bilirubin, Direct: 0.1 mg/dL (ref 0.0–0.3)
Total Bilirubin: 0.6 mg/dL (ref 0.3–1.2)

## 2011-09-04 LAB — TSH: TSH: 0.83 u[IU]/mL (ref 0.35–5.50)

## 2011-09-04 LAB — PSA: PSA: 0.06 ng/mL — ABNORMAL LOW (ref 0.10–4.00)

## 2011-09-04 NOTE — Assessment & Plan Note (Signed)
Doing okay without Rx now

## 2011-09-04 NOTE — Assessment & Plan Note (Signed)
Rate is low but no real symptoms Will continue to observe Absolutely no more propranolol now

## 2011-09-04 NOTE — Assessment & Plan Note (Signed)
BP Readings from Last 3 Encounters:  09/04/11 140/70  05/31/11 158/62  12/20/10 146/59   Good control No changed Lab Results  Component Value Date   CREATININE 0.9 12/20/2010

## 2011-09-04 NOTE — Assessment & Plan Note (Signed)
Try off the doxy

## 2011-09-04 NOTE — Assessment & Plan Note (Signed)
Mild progression ?could bradycardia be involved (probably not) No action now

## 2011-09-04 NOTE — Assessment & Plan Note (Signed)
Has follow up planned at [facility]  i can do that here

## 2011-09-04 NOTE — Progress Notes (Signed)
Subjective:    Patient ID: Michael Kane, male    DOB: 1929/04/17, 78 y.o.   MRN: [id number]  HPI Here with wife Doing okay No recurrence of vertigo  Is having trouble with pain in left knee and hip Worst in early AM Walking seems to help Ibuprofen may help sometimes--400mg  (usually only in AM)  Still on doxycycline for rosacea Concerned about the warnings Discussed stopping and using only if he flares  Memory is okay per him Wife feels he is forgetting more Wife always does the bills Never gotten lost in car No loss of function  Off propranolol and doing okay with tremor Occ gets spells and has used the propranolol rarely  His pulse has been 60 when he checks No chest pain No sOB  Still able to swim regularly Does laps for 30 minutes at a time  Current Outpatient Prescriptions on File Prior to Visit  Medication Sig Dispense Refill  . amLODipine (NORVASC) 10 MG tablet TAKE ONE TABLET BY MOUTH EVERY DAY FOR HIGH BLOOD PRESSURE  30 tablet  11  . aspirin 81 MG tablet Take 81 mg by mouth daily.        . chlorthalidone (HYGROTON) 25 MG tablet TAKE ONE TABLET BY MOUTH EVERY DAY  90 tablet  3  . doxycycline (VIBRAMYCIN) 50 MG capsule TAKE ONE CAPSULE BY MOUTH EVERY DAY FOR  ROSACEA  30 capsule  1  . Multiple Vitamins-Minerals (MENS MULTI VITAMIN & MINERAL PO) Take by mouth daily.        . traZODone (DESYREL) 100 MG tablet TAKE TWO TABLETS BY MOUTH NIGHTLY  60 tablet  11    Allergies  Allergen Reactions  . Lisinopril     REACTION: cough    Past Medical History  Diagnosis Date  . Tremor, essential   . History of prostate cancer     RT 2006  . OSA (obstructive sleep apnea)     on CPAP of 10  . Wolff-Parkinson-White syndrome 1970  . Rosacea   . Hypertension   . Hx of colonic polyps   . Mild cognitive impairment     Past Surgical History  Procedure Date  . Tonsillectomy     Family History  Problem Relation Age of Onset  . Stroke Brother     History    Social History  . Marital Status: Married    Spouse Name: N/A    Number of Children: 3  . Years of Education: N/A   Occupational History  . retired Recruitment consultant    Social History Main Topics  . Smoking status: Former Smoker    Types: Cigarettes    Quit date: 04/23/1958  . Smokeless tobacco: Never Used  . Alcohol Use: Yes     1 beer daily  . Drug Use: Not on file  . Sexually Active: Not on file   Other Topics Concern  . Not on file   Social History Narrative   Wites poetry and short stories.  Occ memoirs (has been published)Has living will. Requested DNR so form completed. Requests wife as health care POA. Would accept feeding tube unless vegetative state   Review of Systems Appetite is fine Weight stable Sleeps fine---unless cat jumps on him Voids okay     Objective:   Physical Exam  Constitutional: He appears well-developed and well-nourished. No distress.  Neck: Normal range of motion. Neck supple.  Cardiovascular: Regular rhythm.  Exam reveals no gallop.   Murmur heard.  Bradycardic Gr 3/6 systolic murmur heard across the precordium  Pulmonary/Chest: Effort normal and breath sounds normal. No respiratory distress. He has no wheezes. He has no rales.  Abdominal: Soft. There is no tenderness.  Musculoskeletal: He exhibits no edema and no tenderness.  Lymphadenopathy:    He has no cervical adenopathy.  Psychiatric: He has a normal mood and affect. His behavior is normal.          Assessment & Plan:

## 2011-09-05 ENCOUNTER — Encounter: Payer: Self-pay | Admitting: *Deleted

## 2011-09-14 DIAGNOSIS — Z8546 Personal history of malignant neoplasm of prostate: Secondary | ICD-10-CM | POA: Diagnosis not present

## 2011-09-14 DIAGNOSIS — Z09 Encounter for follow-up examination after completed treatment for conditions other than malignant neoplasm: Secondary | ICD-10-CM | POA: Diagnosis not present

## 2011-09-24 DIAGNOSIS — H905 Unspecified sensorineural hearing loss: Secondary | ICD-10-CM | POA: Diagnosis not present

## 2011-12-12 DIAGNOSIS — M1710 Unilateral primary osteoarthritis, unspecified knee: Secondary | ICD-10-CM | POA: Diagnosis not present

## 2011-12-21 ENCOUNTER — Other Ambulatory Visit: Payer: Self-pay | Admitting: Internal Medicine

## 2012-02-01 DIAGNOSIS — M1710 Unilateral primary osteoarthritis, unspecified knee: Secondary | ICD-10-CM | POA: Diagnosis not present

## 2012-02-19 DIAGNOSIS — Z23 Encounter for immunization: Secondary | ICD-10-CM | POA: Diagnosis not present

## 2012-03-14 ENCOUNTER — Telehealth: Payer: Self-pay

## 2012-03-14 NOTE — Telephone Encounter (Signed)
Order faxed.

## 2012-03-14 NOTE — Telephone Encounter (Signed)
Morrie Sheldon nurse at [facility]; pt has increased short term memory loss; pt request OT to help pt keep busy with appropriate activities and home safety. Morrie Sheldon needs written order faxed.Please advise.

## 2012-03-14 NOTE — Telephone Encounter (Signed)
Order written Please fax to [facility]

## 2012-03-18 DIAGNOSIS — R41841 Cognitive communication deficit: Secondary | ICD-10-CM | POA: Diagnosis not present

## 2012-03-27 DIAGNOSIS — R41841 Cognitive communication deficit: Secondary | ICD-10-CM | POA: Diagnosis not present

## 2012-03-28 DIAGNOSIS — R41841 Cognitive communication deficit: Secondary | ICD-10-CM | POA: Diagnosis not present

## 2012-04-18 DIAGNOSIS — M1710 Unilateral primary osteoarthritis, unspecified knee: Secondary | ICD-10-CM | POA: Diagnosis not present

## 2012-04-22 ENCOUNTER — Ambulatory Visit (INDEPENDENT_AMBULATORY_CARE_PROVIDER_SITE_OTHER): Payer: Medicare Other | Admitting: Internal Medicine

## 2012-04-22 ENCOUNTER — Encounter: Payer: Self-pay | Admitting: Internal Medicine

## 2012-04-22 VITALS — BP 150/60 | HR 58 | Temp 97.9°F | Ht 67.0 in | Wt 165.0 lb

## 2012-04-22 DIAGNOSIS — G3184 Mild cognitive impairment, so stated: Secondary | ICD-10-CM | POA: Diagnosis not present

## 2012-04-22 DIAGNOSIS — G479 Sleep disorder, unspecified: Secondary | ICD-10-CM

## 2012-04-22 DIAGNOSIS — I10 Essential (primary) hypertension: Secondary | ICD-10-CM

## 2012-04-22 DIAGNOSIS — Z0000 Encounter for general adult medical examination without abnormal findings: Secondary | ICD-10-CM | POA: Diagnosis not present

## 2012-04-22 DIAGNOSIS — Z8546 Personal history of malignant neoplasm of prostate: Secondary | ICD-10-CM | POA: Diagnosis not present

## 2012-04-22 DIAGNOSIS — G250 Essential tremor: Secondary | ICD-10-CM | POA: Diagnosis not present

## 2012-04-22 DIAGNOSIS — G252 Other specified forms of tremor: Secondary | ICD-10-CM | POA: Diagnosis not present

## 2012-04-22 LAB — HEPATIC FUNCTION PANEL
AST: 28 U/L (ref 0–37)
Bilirubin, Direct: 0.1 mg/dL (ref 0.0–0.3)
Total Bilirubin: 0.9 mg/dL (ref 0.3–1.2)

## 2012-04-22 LAB — BASIC METABOLIC PANEL
BUN: 16 mg/dL (ref 6–23)
Calcium: 9.3 mg/dL (ref 8.4–10.5)
Chloride: 102 mEq/L (ref 96–112)
Creatinine, Ser: 1 mg/dL (ref 0.4–1.5)

## 2012-04-22 LAB — CBC WITH DIFFERENTIAL/PLATELET
Eosinophils Absolute: 0.2 10*3/uL (ref 0.0–0.7)
Eosinophils Relative: 2.9 % (ref 0.0–5.0)
Lymphocytes Relative: 28.3 % (ref 12.0–46.0)
MCV: 96.9 fl (ref 78.0–100.0)
Monocytes Absolute: 0.7 10*3/uL (ref 0.1–1.0)
Neutrophils Relative %: 55.9 % (ref 43.0–77.0)
Platelets: 214 10*3/uL (ref 150.0–400.0)
WBC: 5.7 10*3/uL (ref 4.5–10.5)

## 2012-04-22 LAB — PSA: PSA: 0.06 ng/mL — ABNORMAL LOW (ref 0.10–4.00)

## 2012-04-22 LAB — TSH: TSH: 1.37 u[IU]/mL (ref 0.35–5.50)

## 2012-04-22 NOTE — Assessment & Plan Note (Signed)
Mild Discussed no propranolol Could consider mysoline if got worse

## 2012-04-22 NOTE — Assessment & Plan Note (Signed)
I have personally reviewed the Medicare Annual Wellness questionnaire and have noted 1. The patient's medical and social history 2. Their use of alcohol, tobacco or illicit drugs 3. Their current medications and supplements 4. The patient's functional ability including ADL's, fall risks, home safety risks and hearing or visual             impairment. 5. Diet and physical activities 6. Evidence for depression or mood disorders  The patients weight, height, BMI and visual acuity have been recorded in the chart I have made referrals, counseling and provided education to the patient based review of the above and I have provided the pt with a written personalized care plan for preventive services.  I have provided you with a copy of your personalized plan for preventive services. Please take the time to review along with your updated medication list.  Doing well Rx for zostavax No other action needed

## 2012-04-22 NOTE — Assessment & Plan Note (Signed)
BP Readings from Last 3 Encounters:  04/22/12 150/60  09/04/11 140/70  05/31/11 158/62   Reasonable control for age No changes

## 2012-04-22 NOTE — Progress Notes (Signed)
Subjective:    Patient ID: Michael Kane, male    DOB: 1929-03-28, 78 y.o.   MRN: [id number]  HPI Here with wife For wellness visit and check up Has had some trouble with his knee---has appt with Dr Ernest Pine about this No falls No depression or anhedonia Vision and hearing are stable. Uses hearing aides Tries to stay active--some limitation on walking due to knee---still swims regularly Quit smoking long ago Occ alcohol Hasn't gone back to [facility] for prostate follow up for some time---I have been monitoring PSA  Ongoing memory issues---?may be some worse Did have OT evaluation at [facility]---- not too bad Wife still pays the bills--always has Never gotten lost in car or lost sense of who or where he is Repeats himself  Still using the propranolol once in a while if his hands were shaking  Discussed that he should not use this any more  No chest pain  No SOB Occ slight dizzy feeling--not severe. No syncope. Hasn't needed meclizine in some time No ankle edema  Rosacea has been fine Not taking the doxycycline now  Current Outpatient Prescriptions on File Prior to Visit  Medication Sig Dispense Refill  . amLODipine (NORVASC) 10 MG tablet TAKE ONE TABLET BY MOUTH EVERY DAY FOR HIGH BLOOD PRESSURE  30 tablet  11  . aspirin 81 MG tablet Take 81 mg by mouth daily.        . Multiple Vitamins-Minerals (MENS MULTI VITAMIN & MINERAL PO) Take by mouth daily.        . traZODone (DESYREL) 100 MG tablet TAKE TWO TABLETS BY MOUTH IN THE EVENING  60 tablet  11    Allergies  Allergen Reactions  . Lisinopril     REACTION: cough    Past Medical History  Diagnosis Date  . Tremor, essential   . History of prostate cancer     RT 2006  . OSA (obstructive sleep apnea)     on CPAP of 10  . Wolff-Parkinson-White syndrome 1970  . Rosacea   . Hypertension   . Hx of colonic polyps   . Mild cognitive impairment     Past Surgical History  Procedure Date  . Tonsillectomy     Family  History  Problem Relation Age of Onset  . Stroke Brother     History   Social History  . Marital Status: Married    Spouse Name: N/A    Number of Children: 3  . Years of Education: N/A   Occupational History  . retired Recruitment consultant    Social History Main Topics  . Smoking status: Former Smoker    Types: Cigarettes    Quit date: 04/23/1958  . Smokeless tobacco: Never Used  . Alcohol Use: Yes     Comment: 1 beer daily  . Drug Use: Not on file  . Sexually Active: Not on file   Other Topics Concern  . Not on file   Social History Narrative   Wites poetry and short stories.  Occ memoirs (has been published)Has living will. Requested DNR so form completed. Requests wife as health care POA. Would accept feeding tube unless vegetative state   Review of Systems Appetite is fine Weight is down 5# Sleeps okay with 1 trazodone    Objective:   Physical Exam  Constitutional: He is oriented to person, place, and time. He appears well-developed and well-nourished. No distress.  HENT:  Mouth/Throat: Oropharynx is clear and moist. No oropharyngeal exudate.  Neck: Normal range  of motion. Neck supple. No thyromegaly present.  Cardiovascular: Normal rate, regular rhythm, normal heart sounds and intact distal pulses.  Exam reveals no gallop.   No murmur heard. Pulmonary/Chest: Effort normal and breath sounds normal. No respiratory distress. He has no wheezes. He has no rales.  Abdominal: Soft. There is no tenderness.  Musculoskeletal: He exhibits no edema and no tenderness.  Lymphadenopathy:    He has no cervical adenopathy.  Neurological: He is alert and oriented to person, place, and time.       President-- "Obama, ?" [id number] Recall 2/3  Psychiatric: He has a normal mood and affect. His behavior is normal.          Assessment & Plan:

## 2012-04-22 NOTE — Assessment & Plan Note (Signed)
Mild memory problems but no clear apraxia or loss of executive function Observation only

## 2012-04-22 NOTE — Assessment & Plan Note (Signed)
Does okay with the trazodone 

## 2012-04-28 ENCOUNTER — Encounter: Payer: Self-pay | Admitting: *Deleted

## 2012-05-22 ENCOUNTER — Ambulatory Visit (INDEPENDENT_AMBULATORY_CARE_PROVIDER_SITE_OTHER): Payer: Medicare Other | Admitting: Internal Medicine

## 2012-05-22 ENCOUNTER — Encounter: Payer: Self-pay | Admitting: Internal Medicine

## 2012-05-22 VITALS — BP 136/76 | HR 55 | Temp 98.0°F | Wt 168.0 lb

## 2012-05-22 DIAGNOSIS — R319 Hematuria, unspecified: Secondary | ICD-10-CM | POA: Insufficient documentation

## 2012-05-22 LAB — POCT URINALYSIS DIPSTICK
Bilirubin, UA: NEGATIVE
Leukocytes, UA: NEGATIVE

## 2012-05-22 NOTE — Assessment & Plan Note (Signed)
Has had some gross hematuria Now just microscopic  5-10 per HPF without white cells Doesn't appear to be stone or infection Will refer to urology for further eval (?CT and cysto)

## 2012-05-22 NOTE — Progress Notes (Signed)
  Subjective:    Patient ID: Michael Kane, male    DOB: 16-Sep-1928, 79 y.o.   MRN: [id number]  HPI Saw some distinct redness in his urine 4-5 days ago More noticeable in morning Has reappeared several times  No pain in back No dysuria, increased frequency or urgency No fever No prior spells like this  Current Outpatient Prescriptions on File Prior to Visit  Medication Sig Dispense Refill  . amLODipine (NORVASC) 10 MG tablet TAKE ONE TABLET BY MOUTH EVERY DAY FOR HIGH BLOOD PRESSURE  30 tablet  11  . aspirin 81 MG tablet Take 81 mg by mouth daily.        . CELEBREX 200 MG capsule Take 400 mg by mouth daily.       . Multiple Vitamins-Minerals (MENS MULTI VITAMIN & MINERAL PO) Take by mouth daily.        . traZODone (DESYREL) 100 MG tablet TAKE TWO TABLETS BY MOUTH IN THE EVENING  60 tablet  11    Allergies  Allergen Reactions  . Lisinopril     REACTION: cough    Past Medical History  Diagnosis Date  . Tremor, essential   . History of prostate cancer     RT 2006  . OSA (obstructive sleep apnea)     on CPAP of 10  . Wolff-Parkinson-White syndrome 1970  . Rosacea   . Hypertension   . Hx of colonic polyps   . Mild cognitive impairment     Past Surgical History  Procedure Date  . Tonsillectomy     Family History  Problem Relation Age of Onset  . Stroke Brother     History   Social History  . Marital Status: Married    Spouse Name: N/A    Number of Children: 3  . Years of Education: N/A   Occupational History  . retired Recruitment consultant    Social History Main Topics  . Smoking status: Former Smoker    Types: Cigarettes    Quit date: 04/23/1958  . Smokeless tobacco: Never Used  . Alcohol Use: Yes     Comment: 1 beer daily  . Drug Use: Not on file  . Sexually Active: Not on file   Other Topics Concern  . Not on file   Social History Narrative   Wites poetry and short stories.  Occ memoirs (has been published)Has living will. Requested DNR so  form completed. Requests wife as health care POA. Would accept feeding tube unless vegetative state   Review of Systems Does have history of kidney stone ~15-20 years ago Feels fine other than knee--seeing Dr Ernest Pine next week    Objective:   Physical Exam  Constitutional: He appears well-developed and well-nourished. No distress.  Abdominal: Soft. He exhibits no mass. There is no tenderness.  Musculoskeletal:       No CVA tenderness          Assessment & Plan:

## 2012-05-27 DIAGNOSIS — M1710 Unilateral primary osteoarthritis, unspecified knee: Secondary | ICD-10-CM | POA: Diagnosis not present

## 2012-06-10 ENCOUNTER — Other Ambulatory Visit: Payer: Self-pay | Admitting: Internal Medicine

## 2012-06-10 DIAGNOSIS — C61 Malignant neoplasm of prostate: Secondary | ICD-10-CM | POA: Diagnosis not present

## 2012-06-10 DIAGNOSIS — R310 Gross hematuria: Secondary | ICD-10-CM | POA: Diagnosis not present

## 2012-06-16 ENCOUNTER — Ambulatory Visit: Payer: Self-pay | Admitting: General Practice

## 2012-06-16 DIAGNOSIS — G4730 Sleep apnea, unspecified: Secondary | ICD-10-CM | POA: Diagnosis not present

## 2012-06-16 DIAGNOSIS — Z7982 Long term (current) use of aspirin: Secondary | ICD-10-CM | POA: Diagnosis not present

## 2012-06-16 DIAGNOSIS — IMO0002 Reserved for concepts with insufficient information to code with codable children: Secondary | ICD-10-CM | POA: Diagnosis not present

## 2012-06-16 DIAGNOSIS — I499 Cardiac arrhythmia, unspecified: Secondary | ICD-10-CM | POA: Diagnosis not present

## 2012-06-16 DIAGNOSIS — Z8249 Family history of ischemic heart disease and other diseases of the circulatory system: Secondary | ICD-10-CM | POA: Diagnosis not present

## 2012-06-16 DIAGNOSIS — Z79899 Other long term (current) drug therapy: Secondary | ICD-10-CM | POA: Diagnosis not present

## 2012-06-16 DIAGNOSIS — Z9889 Other specified postprocedural states: Secondary | ICD-10-CM | POA: Diagnosis not present

## 2012-06-16 DIAGNOSIS — Z01818 Encounter for other preprocedural examination: Secondary | ICD-10-CM | POA: Diagnosis not present

## 2012-06-16 DIAGNOSIS — M79609 Pain in unspecified limb: Secondary | ICD-10-CM | POA: Diagnosis not present

## 2012-06-16 DIAGNOSIS — Z8546 Personal history of malignant neoplasm of prostate: Secondary | ICD-10-CM | POA: Diagnosis not present

## 2012-06-16 DIAGNOSIS — Z01812 Encounter for preprocedural laboratory examination: Secondary | ICD-10-CM | POA: Diagnosis not present

## 2012-06-16 DIAGNOSIS — Z823 Family history of stroke: Secondary | ICD-10-CM | POA: Diagnosis not present

## 2012-06-16 DIAGNOSIS — I10 Essential (primary) hypertension: Secondary | ICD-10-CM | POA: Diagnosis not present

## 2012-06-16 DIAGNOSIS — Z87891 Personal history of nicotine dependence: Secondary | ICD-10-CM | POA: Diagnosis not present

## 2012-06-16 LAB — URINALYSIS, COMPLETE
Bacteria: NONE SEEN
Glucose,UR: NEGATIVE mg/dL (ref 0–75)
Ketone: NEGATIVE
Leukocyte Esterase: NEGATIVE
Ph: 7 (ref 4.5–8.0)
Protein: NEGATIVE
RBC,UR: 16 /HPF (ref 0–5)
Specific Gravity: 1.016 (ref 1.003–1.030)
Squamous Epithelial: NONE SEEN

## 2012-06-16 LAB — APTT: Activated PTT: 30 secs (ref 23.6–35.9)

## 2012-06-16 LAB — BASIC METABOLIC PANEL
Anion Gap: 4 — ABNORMAL LOW (ref 7–16)
BUN: 20 mg/dL — ABNORMAL HIGH (ref 7–18)
Calcium, Total: 8.9 mg/dL (ref 8.5–10.1)
Co2: 29 mmol/L (ref 21–32)
Creatinine: 0.93 mg/dL (ref 0.60–1.30)
EGFR (African American): >60
Glucose: 94 mg/dL (ref 65–99)
Osmolality: 282 (ref 275–301)
Potassium: 4.1 mmol/L (ref 3.5–5.1)
Sodium: 140 mmol/L (ref 136–145)

## 2012-06-16 LAB — SEDIMENTATION RATE: Erythrocyte Sed Rate: 9 mm/hr (ref 0–20)

## 2012-06-16 LAB — PROTIME-INR: INR: 1

## 2012-06-16 LAB — CBC
HCT: 40.6 % (ref 40.0–52.0)
HGB: 14 g/dL (ref 13.0–18.0)
MCH: 33.2 pg (ref 26.0–34.0)
MCHC: 34.6 g/dL (ref 32.0–36.0)
MCV: 96 fL (ref 80–100)
RBC: 4.23 10*6/uL — ABNORMAL LOW (ref 4.40–5.90)
RDW: 13.6 % (ref 11.5–14.5)

## 2012-06-16 LAB — MRSA PCR SCREENING

## 2012-06-21 HISTORY — PX: TOTAL KNEE ARTHROPLASTY: SHX125

## 2012-06-23 DIAGNOSIS — R310 Gross hematuria: Secondary | ICD-10-CM | POA: Diagnosis not present

## 2012-06-23 DIAGNOSIS — N200 Calculus of kidney: Secondary | ICD-10-CM | POA: Diagnosis not present

## 2012-06-24 ENCOUNTER — Ambulatory Visit (INDEPENDENT_AMBULATORY_CARE_PROVIDER_SITE_OTHER): Payer: Medicare Other | Admitting: Internal Medicine

## 2012-06-24 ENCOUNTER — Encounter: Payer: Self-pay | Admitting: Internal Medicine

## 2012-06-24 VITALS — BP 150/60 | HR 51 | Temp 97.7°F | Wt 168.0 lb

## 2012-06-24 DIAGNOSIS — I498 Other specified cardiac arrhythmias: Secondary | ICD-10-CM | POA: Diagnosis not present

## 2012-06-24 DIAGNOSIS — I350 Nonrheumatic aortic (valve) stenosis: Secondary | ICD-10-CM | POA: Insufficient documentation

## 2012-06-24 DIAGNOSIS — R011 Cardiac murmur, unspecified: Secondary | ICD-10-CM

## 2012-06-24 DIAGNOSIS — R001 Bradycardia, unspecified: Secondary | ICD-10-CM | POA: Insufficient documentation

## 2012-06-24 NOTE — Progress Notes (Signed)
Subjective:    Patient ID: Michael Kane, male    DOB: April 05, 1929, 79 y.o.   MRN: [id number]  HPI Here due to abnormal preop EKG Has left TKR planned by Dr Ernest Pine on March 19th  Has known bradycardia Not walkiing due to the knee pain No dizziness and no syncope No chest pain No SOB even with exertion No edema  Sleeps okay--- flat in bed No PND Trazodone works for him and uses CPAP  Current Outpatient Prescriptions on File Prior to Visit  Medication Sig Dispense Refill  . amLODipine (NORVASC) 10 MG tablet TAKE ONE TABLET BY MOUTH EVERY DAY FOR HIGH BLOOD PRESSURE  30 tablet  11  . aspirin 81 MG tablet Take 81 mg by mouth daily.        . CELEBREX 200 MG capsule Take 400 mg by mouth daily.       . Multiple Vitamins-Minerals (MENS MULTI VITAMIN & MINERAL PO) Take by mouth daily.        . traZODone (DESYREL) 100 MG tablet TAKE TWO TABLETS BY MOUTH IN THE EVENING  60 tablet  11   No current facility-administered medications on file prior to visit.    Allergies  Allergen Reactions  . Lisinopril     REACTION: cough    Past Medical History  Diagnosis Date  . Tremor, essential   . History of prostate cancer     RT 2006  . OSA (obstructive sleep apnea)     on CPAP of 10  . Wolff-Parkinson-White syndrome 1970  . Rosacea   . Hypertension   . Hx of colonic polyps   . Mild cognitive impairment     Past Surgical History  Procedure Laterality Date  . Tonsillectomy      Family History  Problem Relation Age of Onset  . Stroke Brother     History   Social History  . Marital Status: Married    Spouse Name: N/A    Number of Children: 3  . Years of Education: N/A   Occupational History  . retired Recruitment consultant    Social History Main Topics  . Smoking status: Former Smoker    Types: Cigarettes    Quit date: 04/23/1958  . Smokeless tobacco: Never Used  . Alcohol Use: Yes     Comment: 1 beer daily  . Drug Use: Not on file  . Sexually Active: Not on file    Other Topics Concern  . Not on file   Social History Narrative   Wites poetry and short stories.  Occ memoirs (has been published)      Has living will. Requested DNR so form completed. Requests wife as health care POA. Would accept feeding tube unless vegetative state         Review of Systems Had CT for kidney recently Then will go in to Dr Kathrynn Running and decide about the cystoscopy    Objective:   Physical Exam  Constitutional: He appears well-developed and well-nourished. No distress.  Neck: Normal range of motion. Neck supple. No thyromegaly present.  Cardiovascular: Regular rhythm and intact distal pulses.  Exam reveals no gallop.   Murmur heard. Slow Regular with frequent skips Gr 3/6 coarse systolic murmur at apex and base  Pulmonary/Chest: Effort normal and breath sounds normal. No respiratory distress. He has no wheezes. He has no rales.  Abdominal: Soft. There is no tenderness.  No HSM  Musculoskeletal: He exhibits no edema and no tenderness.  Lymphadenopathy:    He  has no cervical adenopathy.  Psychiatric: He has a normal mood and affect. His behavior is normal.          Assessment & Plan:

## 2012-06-24 NOTE — Assessment & Plan Note (Signed)
In the 40's which is not new---and goes back at least 1.5- 2years No symptoms No signs of ischemia---EKG suggestive of LVH on voltage criteria but not clear cut In view of the murmur, will check echo. If LVef okay and no major aortic stenosis, should be fine to proceed with the surgery

## 2012-06-24 NOTE — Assessment & Plan Note (Signed)
I have noted this murmur at least 1.5 years ago but was soft then Now loud and coarse Given the plans for surgery, will proceed with echo first to evaluate valves (especially aortic valve to be sure there is not critical AS) and LV function. If no major findings, should be okay to proceed with surgery

## 2012-06-25 DIAGNOSIS — L570 Actinic keratosis: Secondary | ICD-10-CM | POA: Diagnosis not present

## 2012-06-25 DIAGNOSIS — L821 Other seborrheic keratosis: Secondary | ICD-10-CM | POA: Diagnosis not present

## 2012-06-25 DIAGNOSIS — Z1283 Encounter for screening for malignant neoplasm of skin: Secondary | ICD-10-CM | POA: Diagnosis not present

## 2012-06-25 DIAGNOSIS — Z85828 Personal history of other malignant neoplasm of skin: Secondary | ICD-10-CM | POA: Diagnosis not present

## 2012-06-26 DIAGNOSIS — C61 Malignant neoplasm of prostate: Secondary | ICD-10-CM | POA: Diagnosis not present

## 2012-06-26 DIAGNOSIS — N281 Cyst of kidney, acquired: Secondary | ICD-10-CM | POA: Diagnosis not present

## 2012-06-26 DIAGNOSIS — N200 Calculus of kidney: Secondary | ICD-10-CM | POA: Diagnosis not present

## 2012-06-26 DIAGNOSIS — R310 Gross hematuria: Secondary | ICD-10-CM | POA: Diagnosis not present

## 2012-07-01 ENCOUNTER — Other Ambulatory Visit (HOSPITAL_COMMUNITY): Payer: Self-pay | Admitting: Internal Medicine

## 2012-07-01 DIAGNOSIS — R011 Cardiac murmur, unspecified: Secondary | ICD-10-CM

## 2012-07-01 DIAGNOSIS — R6521 Severe sepsis with septic shock: Secondary | ICD-10-CM

## 2012-07-02 ENCOUNTER — Ambulatory Visit (HOSPITAL_COMMUNITY): Payer: Medicare Other | Attending: Cardiovascular Disease | Admitting: Radiology

## 2012-07-02 DIAGNOSIS — R011 Cardiac murmur, unspecified: Secondary | ICD-10-CM | POA: Insufficient documentation

## 2012-07-02 DIAGNOSIS — Z01810 Encounter for preprocedural cardiovascular examination: Secondary | ICD-10-CM | POA: Insufficient documentation

## 2012-07-02 DIAGNOSIS — I10 Essential (primary) hypertension: Secondary | ICD-10-CM | POA: Diagnosis not present

## 2012-07-02 DIAGNOSIS — I079 Rheumatic tricuspid valve disease, unspecified: Secondary | ICD-10-CM | POA: Diagnosis not present

## 2012-07-02 NOTE — Progress Notes (Signed)
Echocardiogram performed.  

## 2012-07-08 ENCOUNTER — Encounter: Payer: Self-pay | Admitting: Internal Medicine

## 2012-07-09 ENCOUNTER — Inpatient Hospital Stay: Payer: Self-pay | Admitting: General Practice

## 2012-07-09 DIAGNOSIS — F329 Major depressive disorder, single episode, unspecified: Secondary | ICD-10-CM | POA: Diagnosis not present

## 2012-07-09 DIAGNOSIS — L719 Rosacea, unspecified: Secondary | ICD-10-CM | POA: Diagnosis present

## 2012-07-09 DIAGNOSIS — G8918 Other acute postprocedural pain: Secondary | ICD-10-CM | POA: Diagnosis not present

## 2012-07-09 DIAGNOSIS — Z96659 Presence of unspecified artificial knee joint: Secondary | ICD-10-CM | POA: Diagnosis not present

## 2012-07-09 DIAGNOSIS — Z7982 Long term (current) use of aspirin: Secondary | ICD-10-CM | POA: Diagnosis not present

## 2012-07-09 DIAGNOSIS — Z87891 Personal history of nicotine dependence: Secondary | ICD-10-CM | POA: Diagnosis not present

## 2012-07-09 DIAGNOSIS — R6889 Other general symptoms and signs: Secondary | ICD-10-CM | POA: Diagnosis not present

## 2012-07-09 DIAGNOSIS — I10 Essential (primary) hypertension: Secondary | ICD-10-CM | POA: Diagnosis present

## 2012-07-09 DIAGNOSIS — Z471 Aftercare following joint replacement surgery: Secondary | ICD-10-CM | POA: Diagnosis not present

## 2012-07-09 DIAGNOSIS — G4730 Sleep apnea, unspecified: Secondary | ICD-10-CM | POA: Diagnosis present

## 2012-07-09 DIAGNOSIS — M1710 Unilateral primary osteoarthritis, unspecified knee: Secondary | ICD-10-CM | POA: Diagnosis not present

## 2012-07-09 DIAGNOSIS — M25569 Pain in unspecified knee: Secondary | ICD-10-CM | POA: Diagnosis not present

## 2012-07-09 DIAGNOSIS — C61 Malignant neoplasm of prostate: Secondary | ICD-10-CM | POA: Diagnosis present

## 2012-07-09 DIAGNOSIS — Z8546 Personal history of malignant neoplasm of prostate: Secondary | ICD-10-CM | POA: Diagnosis not present

## 2012-07-09 DIAGNOSIS — M6281 Muscle weakness (generalized): Secondary | ICD-10-CM | POA: Diagnosis not present

## 2012-07-10 LAB — BASIC METABOLIC PANEL
Anion Gap: 9 (ref 7–16)
Chloride: 97 mmol/L — ABNORMAL LOW (ref 98–107)
EGFR (Non-African Amer.): >60
Glucose: 117 mg/dL — ABNORMAL HIGH (ref 65–99)
Osmolality: 265 (ref 275–301)
Potassium: 3.2 mmol/L — ABNORMAL LOW (ref 3.5–5.1)
Sodium: 132 mmol/L — ABNORMAL LOW (ref 136–145)

## 2012-07-10 LAB — PLATELET COUNT: Platelet: 162 10*3/uL (ref 150–440)

## 2012-07-10 LAB — HEMOGLOBIN: HGB: 12.7 g/dL — ABNORMAL LOW (ref 13.0–18.0)

## 2012-07-11 LAB — BASIC METABOLIC PANEL
Calcium, Total: 8.3 mg/dL — ABNORMAL LOW (ref 8.5–10.1)
Co2: 25 mmol/L (ref 21–32)
Creatinine: 0.86 mg/dL (ref 0.60–1.30)
EGFR (Non-African Amer.): >60
Osmolality: 266 (ref 275–301)
Potassium: 3.9 mmol/L (ref 3.5–5.1)
Sodium: 132 mmol/L — ABNORMAL LOW (ref 136–145)

## 2012-07-11 LAB — PLATELET COUNT: Platelet: 152 10*3/uL (ref 150–440)

## 2012-07-11 LAB — HEMOGLOBIN: HGB: 12.3 g/dL — ABNORMAL LOW (ref 13.0–18.0)

## 2012-07-12 DIAGNOSIS — G3184 Mild cognitive impairment, so stated: Secondary | ICD-10-CM | POA: Diagnosis not present

## 2012-07-12 DIAGNOSIS — M6281 Muscle weakness (generalized): Secondary | ICD-10-CM | POA: Diagnosis not present

## 2012-07-12 DIAGNOSIS — R6889 Other general symptoms and signs: Secondary | ICD-10-CM | POA: Diagnosis not present

## 2012-07-12 DIAGNOSIS — I10 Essential (primary) hypertension: Secondary | ICD-10-CM | POA: Diagnosis not present

## 2012-07-12 DIAGNOSIS — Z471 Aftercare following joint replacement surgery: Secondary | ICD-10-CM | POA: Diagnosis not present

## 2012-07-12 DIAGNOSIS — G479 Sleep disorder, unspecified: Secondary | ICD-10-CM | POA: Diagnosis not present

## 2012-07-12 DIAGNOSIS — Z7982 Long term (current) use of aspirin: Secondary | ICD-10-CM | POA: Diagnosis not present

## 2012-07-12 DIAGNOSIS — Z8546 Personal history of malignant neoplasm of prostate: Secondary | ICD-10-CM | POA: Diagnosis not present

## 2012-07-12 DIAGNOSIS — F329 Major depressive disorder, single episode, unspecified: Secondary | ICD-10-CM | POA: Diagnosis not present

## 2012-07-12 DIAGNOSIS — M1710 Unilateral primary osteoarthritis, unspecified knee: Secondary | ICD-10-CM | POA: Diagnosis not present

## 2012-07-12 DIAGNOSIS — Z96659 Presence of unspecified artificial knee joint: Secondary | ICD-10-CM | POA: Diagnosis not present

## 2012-07-15 DIAGNOSIS — G3184 Mild cognitive impairment, so stated: Secondary | ICD-10-CM

## 2012-07-15 DIAGNOSIS — G479 Sleep disorder, unspecified: Secondary | ICD-10-CM

## 2012-07-15 DIAGNOSIS — I10 Essential (primary) hypertension: Secondary | ICD-10-CM

## 2012-07-15 DIAGNOSIS — IMO0002 Reserved for concepts with insufficient information to code with codable children: Secondary | ICD-10-CM

## 2012-07-15 DIAGNOSIS — M1710 Unilateral primary osteoarthritis, unspecified knee: Secondary | ICD-10-CM

## 2012-07-24 DIAGNOSIS — G3184 Mild cognitive impairment, so stated: Secondary | ICD-10-CM

## 2012-07-24 DIAGNOSIS — I10 Essential (primary) hypertension: Secondary | ICD-10-CM

## 2012-07-24 DIAGNOSIS — G479 Sleep disorder, unspecified: Secondary | ICD-10-CM

## 2012-07-24 DIAGNOSIS — M1710 Unilateral primary osteoarthritis, unspecified knee: Secondary | ICD-10-CM

## 2012-07-30 DIAGNOSIS — M6281 Muscle weakness (generalized): Secondary | ICD-10-CM | POA: Diagnosis not present

## 2012-07-31 DIAGNOSIS — M6281 Muscle weakness (generalized): Secondary | ICD-10-CM | POA: Diagnosis not present

## 2012-08-01 DIAGNOSIS — M6281 Muscle weakness (generalized): Secondary | ICD-10-CM | POA: Diagnosis not present

## 2012-08-04 ENCOUNTER — Encounter: Payer: Self-pay | Admitting: Gastroenterology

## 2012-08-04 DIAGNOSIS — M6281 Muscle weakness (generalized): Secondary | ICD-10-CM | POA: Diagnosis not present

## 2012-08-05 ENCOUNTER — Telehealth: Payer: Self-pay | Admitting: Internal Medicine

## 2012-08-05 DIAGNOSIS — M6281 Muscle weakness (generalized): Secondary | ICD-10-CM | POA: Diagnosis not present

## 2012-08-05 NOTE — Telephone Encounter (Signed)
Okay to send Rx for hydrocodone 5/325 Tid prn  #40 x 0

## 2012-08-05 NOTE — Telephone Encounter (Signed)
Patient Information:  Caller Name: Lynnae Sandhoff  Phone: [phone]  Patient: Michael Kane, Michael Kane  Gender: Male  DOB: 09-25-1928  Age: 79 Years  PCP: [facility])  Office Follow Up:  Does the office need to follow up with this patient?: Yes  Instructions For The Office: Pain medication for PT  RN Note:  Pharmacy- [facility] [address]-    .  Please contact wife and update on Rx.  Symptoms  Reason For Call & Symptoms: Wife states her husband Rahm had a Left Knee replacement on 07/09/12. Today, at Physical Therapy, the Therapist cannot press down and put through the movements because of his pain.  He is only on Tylenol.  The Therapist asked that he be placed on a stronger medication for PT.   Home from Rehab since last Tuesday  07/29/12.  Reviewed Health History In EMR: Yes  Reviewed Medications In EMR: Yes  Reviewed Allergies In EMR: Yes  Reviewed Surgeries / Procedures: Yes  Date of Onset of Symptoms: 07/29/2012  Treatments Tried: Tylenol, ice  Treatments Tried Worked: No  Guideline(s) Used:  Knee Pain  Disposition Per Guideline:   See Within 3 Days in Office  Reason For Disposition Reached:   Knee pain persists > 7 days  Advice Given:  Knee Pain after Overuse:   Apply Cold to the Area: Apply a cold pack or ice bag (wrapped in a moist towel) to the area for 20 minutes. Repeat in 1 hour, then every 4 hours while awake. Continue this for the first 48 hours after an overuse injury (Reason: reduce the swelling and pain).  Apply Heat to the Area: Beginning 48 hours after an injury, apply a warm washcloth or heating pad for 10 minutes three times a day to help increase blood flow and improve healing.  Rest Your Knee   for the next couple days. Avoid activities that worsen your pain. Reduce activities that put a lot of strain on the knee joint (e.g., deep knee bends, stair climbing, running).  Pain Medicines:  For pain relief, you can take either acetaminophen, ibuprofen,  or naproxen.  They are over-the-counter (OTC) pain drugs. You can buy them at the drugstore.  Call Back If:  Knee pain lasts longer than 7 days  You become worse.  RN Overrode Recommendation:  Patient Requests Prescription  Pain medication for PT

## 2012-08-06 MED ORDER — HYDROCODONE-ACETAMINOPHEN 5-325 MG PO TABS: 1.0000 | ORAL_TABLET | Freq: Three times a day (TID) | ORAL | Status: DC | PRN

## 2012-08-06 NOTE — Telephone Encounter (Signed)
rx called into pharmacy Spoke with patient's wife and advised results  

## 2012-08-07 DIAGNOSIS — M6281 Muscle weakness (generalized): Secondary | ICD-10-CM | POA: Diagnosis not present

## 2012-08-11 DIAGNOSIS — M6281 Muscle weakness (generalized): Secondary | ICD-10-CM | POA: Diagnosis not present

## 2012-08-12 DIAGNOSIS — M6281 Muscle weakness (generalized): Secondary | ICD-10-CM | POA: Diagnosis not present

## 2012-08-13 ENCOUNTER — Ambulatory Visit (INDEPENDENT_AMBULATORY_CARE_PROVIDER_SITE_OTHER): Payer: Medicare Other | Admitting: Internal Medicine

## 2012-08-13 ENCOUNTER — Encounter: Payer: Self-pay | Admitting: Internal Medicine

## 2012-08-13 VITALS — BP 132/70 | HR 66 | Wt 162.0 lb

## 2012-08-13 DIAGNOSIS — M6281 Muscle weakness (generalized): Secondary | ICD-10-CM | POA: Diagnosis not present

## 2012-08-13 DIAGNOSIS — M25562 Pain in left knee: Secondary | ICD-10-CM

## 2012-08-13 DIAGNOSIS — M25569 Pain in unspecified knee: Secondary | ICD-10-CM

## 2012-08-13 MED ORDER — TRAMADOL HCL 50 MG PO TABS: 50.0000 mg | ORAL_TABLET | Freq: Three times a day (TID) | ORAL | Status: DC | PRN

## 2012-08-13 NOTE — Assessment & Plan Note (Signed)
Somewhat delayed rehab but no signs of major issue Has follow up with Dr Ernest Pine next week Will have him try tramadol to see if that works better for pain without side effects

## 2012-08-13 NOTE — Patient Instructions (Signed)
Please try the tramadol for pain. Start with one tab before rehab but you can go up to 2 tabs if no problems with the one but hasn't helped the pain enough

## 2012-08-13 NOTE — Progress Notes (Signed)
  Subjective:    Patient ID: Michael Kane, male    DOB: 07/09/1928, 79 y.o.   MRN: [id number]  HPI Here with wife  Had left TKR about a month ago Home from rehab about 2 weeks Ongoing work with PT Still has significant pain--- the hydrocodone is not helping much Tries it an hour before PT Gets "logy" and some fatigue with it (doesn't think he can increase the dose)  Some left knee swelling No real calf swelling No chest pain No SOB  Current Outpatient Prescriptions on File Prior to Visit  Medication Sig Dispense Refill  . amLODipine (NORVASC) 10 MG tablet TAKE ONE TABLET BY MOUTH EVERY DAY FOR HIGH BLOOD PRESSURE  30 tablet  11  . aspirin 81 MG tablet Take 81 mg by mouth daily.        Marland Kitchen HYDROcodone-acetaminophen (NORCO/VICODIN) 5-325 MG per tablet Take 1 tablet by mouth 3 (three) times daily as needed.  40 tablet  0  . Multiple Vitamins-Minerals (MENS MULTI VITAMIN & MINERAL PO) Take by mouth daily.        . traZODone (DESYREL) 100 MG tablet TAKE TWO TABLETS BY MOUTH IN THE EVENING  60 tablet  11   No current facility-administered medications on file prior to visit.    Allergies  Allergen Reactions  . Lisinopril     REACTION: cough    Past Medical History  Diagnosis Date  . Tremor, essential   . History of prostate cancer     RT 2006  . OSA (obstructive sleep apnea)     on CPAP of 10  . Wolff-Parkinson-White syndrome 1970  . Rosacea   . Hypertension   . Hx of colonic polyps   . Mild cognitive impairment     Past Surgical History  Procedure Laterality Date  . Tonsillectomy    . Total knee arthroplasty Left 3/14    Family History  Problem Relation Age of Onset  . Stroke Brother     History   Social History  . Marital Status: Married    Spouse Name: N/A    Number of Children: 3  . Years of Education: N/A   Occupational History  . retired Recruitment consultant    Social History Main Topics  . Smoking status: Former Smoker    Types: Cigarettes   Quit date: 04/23/1958  . Smokeless tobacco: Never Used  . Alcohol Use: Yes     Comment: 1 beer daily  . Drug Use: Not on file  . Sexually Active: Not on file   Other Topics Concern  . Not on file   Social History Narrative   Wites poetry and short stories.  Occ memoirs (has been published)      Has living will. Requested DNR so form completed. Requests wife as health care POA. Would accept feeding tube unless vegetative state         Review of Systems Appetite is fine Sleeping okay with the trazodone     Objective:   Physical Exam  Constitutional: He appears well-developed and well-nourished. No distress.  Musculoskeletal:  No calf swelling or tenderness Left knee incision is well healed. Mild warmth but no redness Flexion only 90-95 degrees. Unable to fully extend. No significant effusion          Assessment & Plan:

## 2012-08-15 DIAGNOSIS — M6281 Muscle weakness (generalized): Secondary | ICD-10-CM | POA: Diagnosis not present

## 2012-08-18 DIAGNOSIS — M6281 Muscle weakness (generalized): Secondary | ICD-10-CM | POA: Diagnosis not present

## 2012-08-20 DIAGNOSIS — M6281 Muscle weakness (generalized): Secondary | ICD-10-CM | POA: Diagnosis not present

## 2012-08-21 DIAGNOSIS — Z96659 Presence of unspecified artificial knee joint: Secondary | ICD-10-CM | POA: Diagnosis not present

## 2012-08-22 DIAGNOSIS — M6281 Muscle weakness (generalized): Secondary | ICD-10-CM | POA: Diagnosis not present

## 2012-08-25 DIAGNOSIS — M6281 Muscle weakness (generalized): Secondary | ICD-10-CM | POA: Diagnosis not present

## 2012-08-26 DIAGNOSIS — M6281 Muscle weakness (generalized): Secondary | ICD-10-CM | POA: Diagnosis not present

## 2012-08-28 DIAGNOSIS — M6281 Muscle weakness (generalized): Secondary | ICD-10-CM | POA: Diagnosis not present

## 2012-09-01 DIAGNOSIS — M6281 Muscle weakness (generalized): Secondary | ICD-10-CM | POA: Diagnosis not present

## 2012-09-03 DIAGNOSIS — M6281 Muscle weakness (generalized): Secondary | ICD-10-CM | POA: Diagnosis not present

## 2012-09-05 DIAGNOSIS — M6281 Muscle weakness (generalized): Secondary | ICD-10-CM | POA: Diagnosis not present

## 2012-09-08 DIAGNOSIS — M6281 Muscle weakness (generalized): Secondary | ICD-10-CM | POA: Diagnosis not present

## 2012-09-09 DIAGNOSIS — M6281 Muscle weakness (generalized): Secondary | ICD-10-CM | POA: Diagnosis not present

## 2012-09-11 DIAGNOSIS — M6281 Muscle weakness (generalized): Secondary | ICD-10-CM | POA: Diagnosis not present

## 2012-09-16 DIAGNOSIS — M6281 Muscle weakness (generalized): Secondary | ICD-10-CM | POA: Diagnosis not present

## 2012-09-18 DIAGNOSIS — M6281 Muscle weakness (generalized): Secondary | ICD-10-CM | POA: Diagnosis not present

## 2012-09-20 DIAGNOSIS — M6281 Muscle weakness (generalized): Secondary | ICD-10-CM | POA: Diagnosis not present

## 2012-10-03 DIAGNOSIS — Z96659 Presence of unspecified artificial knee joint: Secondary | ICD-10-CM | POA: Diagnosis not present

## 2012-10-03 DIAGNOSIS — M6281 Muscle weakness (generalized): Secondary | ICD-10-CM | POA: Diagnosis not present

## 2012-10-17 ENCOUNTER — Encounter: Payer: Self-pay | Admitting: Radiology

## 2012-10-20 ENCOUNTER — Encounter: Payer: Self-pay | Admitting: Internal Medicine

## 2012-10-20 ENCOUNTER — Ambulatory Visit (INDEPENDENT_AMBULATORY_CARE_PROVIDER_SITE_OTHER): Payer: Medicare Other | Admitting: Internal Medicine

## 2012-10-20 VITALS — BP 128/60 | HR 59 | Wt 159.0 lb

## 2012-10-20 DIAGNOSIS — G250 Essential tremor: Secondary | ICD-10-CM | POA: Diagnosis not present

## 2012-10-20 DIAGNOSIS — G252 Other specified forms of tremor: Secondary | ICD-10-CM

## 2012-10-20 DIAGNOSIS — G3184 Mild cognitive impairment, so stated: Secondary | ICD-10-CM | POA: Diagnosis not present

## 2012-10-20 DIAGNOSIS — G479 Sleep disorder, unspecified: Secondary | ICD-10-CM | POA: Diagnosis not present

## 2012-10-20 DIAGNOSIS — I10 Essential (primary) hypertension: Secondary | ICD-10-CM | POA: Diagnosis not present

## 2012-10-20 NOTE — Assessment & Plan Note (Signed)
Doing okay without meds Propranolol caused bradycardia

## 2012-10-20 NOTE — Assessment & Plan Note (Signed)
Ongoing amnestic symptoms without functional problems No Rx for now

## 2012-10-20 NOTE — Assessment & Plan Note (Signed)
BP Readings from Last 3 Encounters:  10/20/12 128/60  08/13/12 132/70  06/24/12 150/60   Good control No changes Labs next time

## 2012-10-20 NOTE — Progress Notes (Signed)
Subjective:    Patient ID: Michael Kane, male    DOB: 14-Aug-1928, 79 y.o.   MRN: [id number]  HPI Here with wife  Doing well with left TKR Getting around better Done with therapy but still supposed to be working on full extension--discussed the vital importance of this Has been walking a lot No pain problems--only once in a while  Still has some memory issues Will "get in the way of doing things"-- forgets to do things (but still knows how to) Wife reminds Repeats stories but not that much. Occasionally will repeat questions (like "when are we going to the store")  No heart troubles No palpitations No chest pain or SOB No dizziness or syncope  Off propranolol due to bradycardia Still with some tremor--but not affecting him much  Current Outpatient Prescriptions on File Prior to Visit  Medication Sig Dispense Refill  . amLODipine (NORVASC) 10 MG tablet TAKE ONE TABLET BY MOUTH EVERY DAY FOR HIGH BLOOD PRESSURE  30 tablet  11  . aspirin 81 MG tablet Take 81 mg by mouth daily.        . Multiple Vitamins-Minerals (MENS MULTI VITAMIN & MINERAL PO) Take by mouth daily.        . traZODone (DESYREL) 100 MG tablet TAKE TWO TABLETS BY MOUTH IN THE EVENING  60 tablet  11   No current facility-administered medications on file prior to visit.    Allergies  Allergen Reactions  . Lisinopril     REACTION: cough    Past Medical History  Diagnosis Date  . Tremor, essential   . History of prostate cancer     RT 2006  . OSA (obstructive sleep apnea)     on CPAP of 10  . Wolff-Parkinson-White syndrome 1970  . Rosacea   . Hypertension   . Hx of colonic polyps   . Mild cognitive impairment     Past Surgical History  Procedure Laterality Date  . Tonsillectomy    . Total knee arthroplasty Left 3/14    Family History  Problem Relation Age of Onset  . Stroke Brother     History   Social History  . Marital Status: Married    Spouse Name: N/A    Number of Children: 3  .  Years of Education: N/A   Occupational History  . retired Recruitment consultant    Social History Main Topics  . Smoking status: Former Smoker    Types: Cigarettes    Quit date: 04/23/1958  . Smokeless tobacco: Never Used  . Alcohol Use: Yes     Comment: 1 beer daily  . Drug Use: Not on file  . Sexually Active: Not on file   Other Topics Concern  . Not on file   Social History Narrative   Wites poetry and short stories.  Occ memoirs (has been published)      Has living will. Requested DNR so form completed. Requests wife as health care POA. Would accept feeding tube unless vegetative state         Review of Systems Hasn't seen hematuria of late---reviewed urology work up. May have had red tinge this AM Appetite is good Weight down 3#--- ?summer thing Sleeps well with the trazodone    Objective:   Physical Exam  Constitutional: He appears well-developed and well-nourished. No distress.  Neck: Normal range of motion. Neck supple.  Cardiovascular: Normal rate and regular rhythm.  Exam reveals no gallop.   Murmur heard. Gr 2/6 systolic murmur  Pulmonary/Chest: Effort normal and breath sounds normal. No respiratory distress. He has no wheezes. He has no rales.  Musculoskeletal: He exhibits no edema and no tenderness.  Lymphadenopathy:    He has no cervical adenopathy.  Neurological:  Recall issues during the appt---forgot what we discussed at the beginning 10 minutes later          Assessment & Plan:

## 2012-10-20 NOTE — Assessment & Plan Note (Signed)
Does fine with the trazodone

## 2013-01-03 ENCOUNTER — Other Ambulatory Visit: Payer: Self-pay | Admitting: Internal Medicine

## 2013-01-23 DIAGNOSIS — Z23 Encounter for immunization: Secondary | ICD-10-CM | POA: Diagnosis not present

## 2013-02-03 ENCOUNTER — Encounter: Payer: Self-pay | Admitting: Podiatry

## 2013-02-03 ENCOUNTER — Ambulatory Visit (INDEPENDENT_AMBULATORY_CARE_PROVIDER_SITE_OTHER): Payer: Medicare Other | Admitting: Podiatry

## 2013-02-03 VITALS — BP 135/56 | HR 59 | Resp 20 | Ht 68.0 in | Wt 160.0 lb

## 2013-02-03 DIAGNOSIS — L03039 Cellulitis of unspecified toe: Secondary | ICD-10-CM | POA: Diagnosis not present

## 2013-02-03 NOTE — Progress Notes (Signed)
Subjective:     Patient ID: Michael Kane, male   DOB: 18-Feb-1929, 79 y.o.   MRN: [id number]  HPI patient presents stating I have an infection in my left second toe. He said he has tried soaking it and trimming it without relief and it has bled   Review of Systems  All other systems reviewed and are negative.       Objective:   Physical Exam  Nursing note and vitals reviewed. Constitutional: He appears well-developed and well-nourished.  Cardiovascular: Intact distal pulses.   Musculoskeletal: Normal range of motion.  Neurological: He is alert.  Skin: Skin is warm.   the left second nail lateral side has chronic tissue redness and pain. I noted a small amount of distal drainage with no proximal spread note     Assessment:     Paronychia infection left second nail lateral border    Plan:     H&P performed and condition explained infiltrated 60 mg Xylocaine Marcaine mixture and removed the lateral border all proud flesh and abscess tissue. Applied sterile dressing and instructed on soaks

## 2013-02-03 NOTE — Patient Instructions (Signed)

## 2013-02-03 NOTE — Progress Notes (Signed)
N HURT L 2ND DIGIT TOENAIL LEFT D 1 - 2 WEEKS O SUDDEN C SAME A STANDING, WALKING, TOUCHING TOE T NEOSPORIN

## 2013-02-06 ENCOUNTER — Other Ambulatory Visit: Payer: Self-pay | Admitting: Internal Medicine

## 2013-02-16 ENCOUNTER — Encounter: Payer: Self-pay | Admitting: Internal Medicine

## 2013-02-16 ENCOUNTER — Ambulatory Visit (INDEPENDENT_AMBULATORY_CARE_PROVIDER_SITE_OTHER): Payer: Medicare Other | Admitting: Internal Medicine

## 2013-02-16 VITALS — BP 158/60 | HR 43 | Temp 98.6°F | Wt 164.0 lb

## 2013-02-16 DIAGNOSIS — M25552 Pain in left hip: Secondary | ICD-10-CM

## 2013-02-16 DIAGNOSIS — M25559 Pain in unspecified hip: Secondary | ICD-10-CM

## 2013-02-16 MED ORDER — TRAMADOL HCL 50 MG PO TABS: 50.0000 mg | ORAL_TABLET | Freq: Three times a day (TID) | ORAL | Status: DC | PRN

## 2013-02-16 NOTE — Patient Instructions (Signed)
Please set up an appointment for Dr Ernest Pine to look at your hip.

## 2013-02-16 NOTE — Assessment & Plan Note (Signed)
Seems likely to be from arthritis Recommended that he go back to Dr Ernest Pine for evaluation of hip also

## 2013-02-16 NOTE — Progress Notes (Signed)
  Subjective:    Patient ID: Michael Kane, male    DOB: 07/05/1928, 79 y.o.   MRN: [id number]  HPI Here with wife  Having serious problem with his left hip Had his knee surgery and did PT---but didn't do the full course Knee is okay but hip is bad for about a month  Points to posterior portion of hip as pain spot Achy all the time--- especially bad when walking  Tried ibuprofen 400mg  -didn't seem to help (used as much as bid) No obvious restriction of motion  Current Outpatient Prescriptions on File Prior to Visit  Medication Sig Dispense Refill  . amLODipine (NORVASC) 10 MG tablet TAKE ONE TABLET BY MOUTH EVERY DAY FOR HIGH BLOOD PRESSURE  30 tablet  11  . aspirin 81 MG tablet Take 81 mg by mouth daily.        . Multiple Vitamins-Minerals (MENS MULTI VITAMIN & MINERAL PO) Take by mouth daily.        . traZODone (DESYREL) 100 MG tablet TAKE TWO TABLETS BY MOUTH IN THE EVENING  60 tablet  0   No current facility-administered medications on file prior to visit.    Allergies  Allergen Reactions  . Lisinopril     REACTION: cough    Past Medical History  Diagnosis Date  . Tremor, essential   . History of prostate cancer     RT 2006  . OSA (obstructive sleep apnea)     on CPAP of 10  . Wolff-Parkinson-White syndrome 1970  . Rosacea   . Hypertension   . Hx of colonic polyps   . Mild cognitive impairment     Past Surgical History  Procedure Laterality Date  . Tonsillectomy    . Total knee arthroplasty Left 3/14    Family History  Problem Relation Age of Onset  . Stroke Brother     History   Social History  . Marital Status: Married    Spouse Name: N/A    Number of Children: 3  . Years of Education: N/A   Occupational History  . retired Recruitment consultant    Social History Main Topics  . Smoking status: Former Smoker    Types: Cigarettes    Quit date: 04/23/1958  . Smokeless tobacco: Never Used  . Alcohol Use: Yes     Comment: 1 beer daily  . Drug  Use: No  . Sexual Activity: Not on file   Other Topics Concern  . Not on file   Social History Narrative   Wites poetry and short stories.  Occ memoirs (has been published)      Has living will. Requested DNR so form completed. Requests wife as health care POA. Would accept feeding tube unless vegetative state         Review of Systems No fever Otherwise feels okay    Objective:   Physical Exam  Constitutional: He appears well-developed and well-nourished. No distress.  Musculoskeletal:  No spine or S-I tenderness No left hip bursa tenderness Pain is recreated with passive internal rotation of the hip--- very limited External rotation/flexion are pretty good  Neurological:  Gait is normal--though small steps No focal leg weakness          Assessment & Plan:

## 2013-02-20 DIAGNOSIS — M5430 Sciatica, unspecified side: Secondary | ICD-10-CM | POA: Diagnosis not present

## 2013-02-23 ENCOUNTER — Ambulatory Visit: Payer: Self-pay | Admitting: Podiatry

## 2013-02-24 DIAGNOSIS — R262 Difficulty in walking, not elsewhere classified: Secondary | ICD-10-CM | POA: Diagnosis not present

## 2013-02-24 DIAGNOSIS — M545 Low back pain: Secondary | ICD-10-CM | POA: Diagnosis not present

## 2013-02-25 DIAGNOSIS — M545 Low back pain: Secondary | ICD-10-CM | POA: Diagnosis not present

## 2013-02-25 DIAGNOSIS — R262 Difficulty in walking, not elsewhere classified: Secondary | ICD-10-CM | POA: Diagnosis not present

## 2013-02-27 DIAGNOSIS — R262 Difficulty in walking, not elsewhere classified: Secondary | ICD-10-CM | POA: Diagnosis not present

## 2013-02-27 DIAGNOSIS — M545 Low back pain: Secondary | ICD-10-CM | POA: Diagnosis not present

## 2013-03-02 DIAGNOSIS — M545 Low back pain: Secondary | ICD-10-CM | POA: Diagnosis not present

## 2013-03-02 DIAGNOSIS — R262 Difficulty in walking, not elsewhere classified: Secondary | ICD-10-CM | POA: Diagnosis not present

## 2013-03-04 DIAGNOSIS — M545 Low back pain: Secondary | ICD-10-CM | POA: Diagnosis not present

## 2013-03-04 DIAGNOSIS — R262 Difficulty in walking, not elsewhere classified: Secondary | ICD-10-CM | POA: Diagnosis not present

## 2013-03-06 DIAGNOSIS — M545 Low back pain: Secondary | ICD-10-CM | POA: Diagnosis not present

## 2013-03-06 DIAGNOSIS — R262 Difficulty in walking, not elsewhere classified: Secondary | ICD-10-CM | POA: Diagnosis not present

## 2013-03-09 DIAGNOSIS — M545 Low back pain: Secondary | ICD-10-CM | POA: Diagnosis not present

## 2013-03-09 DIAGNOSIS — R262 Difficulty in walking, not elsewhere classified: Secondary | ICD-10-CM | POA: Diagnosis not present

## 2013-03-10 ENCOUNTER — Other Ambulatory Visit: Payer: Self-pay | Admitting: Internal Medicine

## 2013-03-11 DIAGNOSIS — M545 Low back pain: Secondary | ICD-10-CM | POA: Diagnosis not present

## 2013-03-11 DIAGNOSIS — R262 Difficulty in walking, not elsewhere classified: Secondary | ICD-10-CM | POA: Diagnosis not present

## 2013-03-12 ENCOUNTER — Other Ambulatory Visit: Payer: Self-pay | Admitting: Internal Medicine

## 2013-03-13 DIAGNOSIS — M545 Low back pain: Secondary | ICD-10-CM | POA: Diagnosis not present

## 2013-03-13 DIAGNOSIS — R262 Difficulty in walking, not elsewhere classified: Secondary | ICD-10-CM | POA: Diagnosis not present

## 2013-03-30 DIAGNOSIS — M545 Low back pain: Secondary | ICD-10-CM | POA: Diagnosis not present

## 2013-03-30 DIAGNOSIS — R262 Difficulty in walking, not elsewhere classified: Secondary | ICD-10-CM | POA: Diagnosis not present

## 2013-04-01 DIAGNOSIS — R262 Difficulty in walking, not elsewhere classified: Secondary | ICD-10-CM | POA: Diagnosis not present

## 2013-04-01 DIAGNOSIS — M545 Low back pain: Secondary | ICD-10-CM | POA: Diagnosis not present

## 2013-04-03 DIAGNOSIS — R262 Difficulty in walking, not elsewhere classified: Secondary | ICD-10-CM | POA: Diagnosis not present

## 2013-04-03 DIAGNOSIS — M545 Low back pain: Secondary | ICD-10-CM | POA: Diagnosis not present

## 2013-04-06 DIAGNOSIS — M545 Low back pain: Secondary | ICD-10-CM | POA: Diagnosis not present

## 2013-04-06 DIAGNOSIS — R262 Difficulty in walking, not elsewhere classified: Secondary | ICD-10-CM | POA: Diagnosis not present

## 2013-04-08 DIAGNOSIS — M545 Low back pain: Secondary | ICD-10-CM | POA: Diagnosis not present

## 2013-04-08 DIAGNOSIS — R262 Difficulty in walking, not elsewhere classified: Secondary | ICD-10-CM | POA: Diagnosis not present

## 2013-04-09 DIAGNOSIS — R262 Difficulty in walking, not elsewhere classified: Secondary | ICD-10-CM | POA: Diagnosis not present

## 2013-04-09 DIAGNOSIS — M545 Low back pain: Secondary | ICD-10-CM | POA: Diagnosis not present

## 2013-04-24 ENCOUNTER — Encounter: Payer: Medicare Other | Admitting: Internal Medicine

## 2013-04-24 DIAGNOSIS — M545 Low back pain, unspecified: Secondary | ICD-10-CM | POA: Diagnosis not present

## 2013-05-08 ENCOUNTER — Encounter: Payer: Medicare Other | Admitting: Internal Medicine

## 2013-05-12 ENCOUNTER — Other Ambulatory Visit: Payer: Self-pay | Admitting: Internal Medicine

## 2013-05-12 ENCOUNTER — Ambulatory Visit (INDEPENDENT_AMBULATORY_CARE_PROVIDER_SITE_OTHER): Payer: Medicare Other | Admitting: Internal Medicine

## 2013-05-12 ENCOUNTER — Encounter: Payer: Self-pay | Admitting: Internal Medicine

## 2013-05-12 VITALS — BP 140/60 | HR 49 | Temp 98.0°F | Ht 68.0 in | Wt 165.0 lb

## 2013-05-12 DIAGNOSIS — G479 Sleep disorder, unspecified: Secondary | ICD-10-CM | POA: Diagnosis not present

## 2013-05-12 DIAGNOSIS — G3184 Mild cognitive impairment, so stated: Secondary | ICD-10-CM

## 2013-05-12 DIAGNOSIS — Z0000 Encounter for general adult medical examination without abnormal findings: Secondary | ICD-10-CM

## 2013-05-12 DIAGNOSIS — G252 Other specified forms of tremor: Secondary | ICD-10-CM

## 2013-05-12 DIAGNOSIS — I359 Nonrheumatic aortic valve disorder, unspecified: Secondary | ICD-10-CM | POA: Diagnosis not present

## 2013-05-12 DIAGNOSIS — G250 Essential tremor: Secondary | ICD-10-CM

## 2013-05-12 DIAGNOSIS — I350 Nonrheumatic aortic (valve) stenosis: Secondary | ICD-10-CM

## 2013-05-12 DIAGNOSIS — I10 Essential (primary) hypertension: Secondary | ICD-10-CM

## 2013-05-12 LAB — CBC WITH DIFFERENTIAL/PLATELET
BASOS ABS: 0 10*3/uL (ref 0.0–0.1)
Basophils Relative: 0.1 % (ref 0.0–3.0)
EOS ABS: 0.1 10*3/uL (ref 0.0–0.7)
Eosinophils Relative: 1.6 % (ref 0.0–5.0)
HCT: 41.3 % (ref 39.0–52.0)
HEMOGLOBIN: 13.9 g/dL (ref 13.0–17.0)
LYMPHS ABS: 1.6 10*3/uL (ref 0.7–4.0)
LYMPHS PCT: 24.4 % (ref 12.0–46.0)
MCHC: 33.6 g/dL (ref 30.0–36.0)
MCV: 95.8 fl (ref 78.0–100.0)
MONOS PCT: 11.6 % (ref 3.0–12.0)
Monocytes Absolute: 0.8 10*3/uL (ref 0.1–1.0)
NEUTROS ABS: 4.1 10*3/uL (ref 1.4–7.7)
Neutrophils Relative %: 62.3 % (ref 43.0–77.0)
PLATELETS: 231 10*3/uL (ref 150.0–400.0)
RBC: 4.31 Mil/uL (ref 4.22–5.81)
RDW: 14.3 % (ref 11.5–14.6)
WBC: 6.7 10*3/uL (ref 4.5–10.5)

## 2013-05-12 LAB — COMPREHENSIVE METABOLIC PANEL
ALT: 17 U/L (ref 0–53)
AST: 26 U/L (ref 0–37)
Albumin: 4.1 g/dL (ref 3.5–5.2)
Alkaline Phosphatase: 71 U/L (ref 39–117)
BUN: 18 mg/dL (ref 6–23)
CO2: 28 mEq/L (ref 19–32)
Calcium: 9.2 mg/dL (ref 8.4–10.5)
Chloride: 104 mEq/L (ref 96–112)
Creatinine, Ser: 1 mg/dL (ref 0.4–1.5)
GFR: 75.13 mL/min (ref >60.00)
Glucose, Bld: 89 mg/dL (ref 70–99)
Potassium: 4 mEq/L (ref 3.5–5.1)
Sodium: 139 mEq/L (ref 135–145)
Total Bilirubin: 0.6 mg/dL (ref 0.3–1.2)
Total Protein: 7.1 g/dL (ref 6.0–8.3)

## 2013-05-12 LAB — TSH: TSH: 1.22 u[IU]/mL (ref 0.35–5.50)

## 2013-05-12 LAB — T4, FREE: Free T4: 1.13 ng/dL (ref 0.60–1.60)

## 2013-05-12 MED ORDER — ZOSTER VACCINE LIVE 19400 UNT/0.65ML ~~LOC~~ SOLR: 0.6500 mL | Freq: Once | SUBCUTANEOUS | Status: DC

## 2013-05-12 NOTE — Assessment & Plan Note (Signed)
BP Readings from Last 3 Encounters:  05/12/13 140/60  02/16/13 158/60  02/03/13 135/56   Doing fine Continue med now

## 2013-05-12 NOTE — Assessment & Plan Note (Signed)
I have personally reviewed the Medicare Annual Wellness questionnaire and have noted 1. The patient's medical and social history 2. Their use of alcohol, tobacco or illicit drugs 3. Their current medications and supplements 4. The patient's functional ability including ADL's, fall risks, home safety risks and hearing or visual             impairment. 5. Diet and physical activities 6. Evidence for depression or mood disorders  The patients weight, height, BMI and visual acuity have been recorded in the chart I have made referrals, counseling and provided education to the patient based review of the above and I have provided the pt with a written personalized care plan for preventive services.  I have provided you with a copy of your personalized plan for preventive services. Please take the time to review along with your updated medication list.  Will give Rx for zostavax UTD otherwise

## 2013-05-12 NOTE — Progress Notes (Signed)
Subjective:    Patient ID: Michael Kane, male    DOB: 1929/01/28, 80 y.o.   MRN: [id number]  HPI Here with wife For Wellness visit and follow up Reviewed his wellness sheet Tries to exercise regularly 1 drink per day usually. No tobacco Independent with instrumental ADLs Reviewed advanced directives Vision is fine. Hearing aides  Hip is better Got lift in shoe from PT and now it is better Recovered from TKR  Reviewed memory issues Wife feels it may be some worse--he has to always check with wife about their schedule No change in function or true apraxia--does need reminders to do tasks that are planned though  No chest pain No SOB No palpitations Occasional dizzy feelings--- last was 1 month ago or so. Gets in spells and then resolves. Didn't use meclizine with recent episodes. No syncope No edema  Still has mild tremor Not bad enough to need Rx  Current Outpatient Prescriptions on File Prior to Visit  Medication Sig Dispense Refill  . amLODipine (NORVASC) 10 MG tablet TAKE ONE TABLET BY MOUTH EVERY DAY FOR HIGH BLOOD PRESSURE  30 tablet  11  . aspirin 81 MG tablet Take 81 mg by mouth daily.        . Multiple Vitamins-Minerals (MENS MULTI VITAMIN & MINERAL PO) Take by mouth daily.        . traZODone (DESYREL) 100 MG tablet TAKE 2 TABLETS BY MOUTH IN THE EVENING  60 tablet  0   No current facility-administered medications on file prior to visit.    Allergies  Allergen Reactions  . Lisinopril     REACTION: cough    Past Medical History  Diagnosis Date  . Tremor, essential   . History of prostate cancer     RT 2006  . OSA (obstructive sleep apnea)     on CPAP of 10  . Wolff-Parkinson-White syndrome 1970  . Rosacea   . Hypertension   . Hx of colonic polyps   . Mild cognitive impairment     Past Surgical History  Procedure Laterality Date  . Tonsillectomy    . Total knee arthroplasty Left 3/14    Family History  Problem Relation Age of Onset  .  Stroke Brother     History   Social History  . Marital Status: Married    Spouse Name: N/A    Number of Children: 3  . Years of Education: N/A   Occupational History  . retired Charity fundraiser    Social History Main Topics  . Smoking status: Former Smoker    Types: Cigarettes    Quit date: 04/23/1958  . Smokeless tobacco: Never Used  . Alcohol Use: Yes     Comment: 1 beer daily  . Drug Use: No  . Sexual Activity: Not on file   Other Topics Concern  . Not on file   Social History Narrative   Wrote poetry and  short stories.  Occ memoirs (has been published)      Has living will.    Requested DNR so form completed.    Requests wife as health care POA.    Would accept feeding tube unless vegetative state            Review of Systems Sleeping well with the trazodone No depression or anhedonia---mood has been good Voiding well--no hesitancy Bowels fine Regular with dentist---good shape Appetite is good---weight stable    Objective:   Physical Exam  Constitutional: He is oriented to person,  place, and time. He appears well-developed and well-nourished. No distress.  HENT:  Mouth/Throat: Oropharynx is clear and moist. No oropharyngeal exudate.  Neck: Normal range of motion. Neck supple. No thyromegaly present.  Cardiovascular: Normal rate, regular rhythm and intact distal pulses.  Exam reveals no gallop.   Gr 2/6 coarse systolic murmur across precordium to carotids  Pulmonary/Chest: Effort normal and breath sounds normal. No respiratory distress. He has no wheezes. He has no rales.  Abdominal: Soft. There is no tenderness.  Musculoskeletal: He exhibits no edema and no tenderness.  Lymphadenopathy:    He has no cervical adenopathy.  Neurological: He is alert and oriented to person, place, and time.  Skin: No rash noted. No erythema.  Psychiatric: He has a normal mood and affect. His behavior is normal.          Assessment & Plan:

## 2013-05-12 NOTE — Assessment & Plan Note (Signed)
?  slight worsening but no change in functional status Discussed social engagement, cognitive and physical activity

## 2013-05-12 NOTE — Addendum Note (Signed)
Addended by: Ellamae Sia on: 05/12/2013 10:07 AM   Modules accepted: Orders

## 2013-05-12 NOTE — Assessment & Plan Note (Signed)
Does well with the trazodone 

## 2013-05-12 NOTE — Assessment & Plan Note (Signed)
Mild No Rx indicated 

## 2013-05-12 NOTE — Assessment & Plan Note (Signed)
If any worsening dizziness, would decrease amlodipine

## 2013-05-12 NOTE — Progress Notes (Signed)
Pre-visit discussion using our clinic review tool. No additional management support is needed unless otherwise documented below in the visit note.  

## 2013-05-13 ENCOUNTER — Encounter: Payer: Self-pay | Admitting: *Deleted

## 2013-05-27 ENCOUNTER — Telehealth: Payer: Self-pay | Admitting: Internal Medicine

## 2013-05-27 NOTE — Telephone Encounter (Signed)
Relevant patient education mailed to patient.  

## 2013-06-09 ENCOUNTER — Other Ambulatory Visit: Payer: Self-pay | Admitting: Internal Medicine

## 2013-06-22 ENCOUNTER — Other Ambulatory Visit: Payer: Self-pay | Admitting: Internal Medicine

## 2013-06-24 DIAGNOSIS — Z1283 Encounter for screening for malignant neoplasm of skin: Secondary | ICD-10-CM | POA: Diagnosis not present

## 2013-06-24 DIAGNOSIS — Z85828 Personal history of other malignant neoplasm of skin: Secondary | ICD-10-CM | POA: Diagnosis not present

## 2013-06-24 DIAGNOSIS — L608 Other nail disorders: Secondary | ICD-10-CM | POA: Diagnosis not present

## 2013-06-30 DIAGNOSIS — L980 Pyogenic granuloma: Secondary | ICD-10-CM | POA: Diagnosis not present

## 2013-07-10 ENCOUNTER — Encounter: Payer: Self-pay | Admitting: Gastroenterology

## 2013-07-11 ENCOUNTER — Other Ambulatory Visit: Payer: Self-pay | Admitting: Internal Medicine

## 2013-08-03 DIAGNOSIS — L980 Pyogenic granuloma: Secondary | ICD-10-CM | POA: Diagnosis not present

## 2013-08-10 ENCOUNTER — Other Ambulatory Visit: Payer: Self-pay | Admitting: Internal Medicine

## 2013-09-21 DIAGNOSIS — D485 Neoplasm of uncertain behavior of skin: Secondary | ICD-10-CM | POA: Diagnosis not present

## 2013-09-21 DIAGNOSIS — L089 Local infection of the skin and subcutaneous tissue, unspecified: Secondary | ICD-10-CM | POA: Diagnosis not present

## 2013-12-23 DIAGNOSIS — L980 Pyogenic granuloma: Secondary | ICD-10-CM | POA: Diagnosis not present

## 2014-01-04 ENCOUNTER — Encounter: Payer: Self-pay | Admitting: Podiatry

## 2014-01-05 ENCOUNTER — Ambulatory Visit: Payer: Medicare Other | Admitting: Podiatry

## 2014-01-05 VITALS — BP 178/78 | HR 58 | Resp 16

## 2014-01-05 DIAGNOSIS — L03039 Cellulitis of unspecified toe: Secondary | ICD-10-CM | POA: Diagnosis not present

## 2014-01-05 DIAGNOSIS — L600 Ingrowing nail: Secondary | ICD-10-CM | POA: Diagnosis not present

## 2014-01-05 DIAGNOSIS — M79609 Pain in unspecified limb: Secondary | ICD-10-CM

## 2014-01-05 NOTE — Progress Notes (Signed)
Subjective:     Patient ID: Michael Kane, male   DOB: 08-15-1928, 80 y.o.   MRN: [id number]  HPI patient presents stating I'm having a lot of problems with the second toe on my left foot and it's very he rotated and making it hard for me to wear shoe gear   Review of Systems     Objective:   Physical Exam Neurovascular status intact with muscle strength adequate and noted to have a inflamed second toe left foot lateral border with distal necrotic tissue formation    Assessment:     Paronychia infection with chronic ingrown toenail second toe left    Plan:     Discussed problem with patient and family and recommended a two-part process with the first being infiltration of the toe 60 mg Xylocaine Marcaine mixture removal of the lateral border and all proud flesh and creating channels for drainage. Applied sterile dressing and instructed on soaks. Reappoint in 2 weeks for permanent removal of the corner

## 2014-01-05 NOTE — Patient Instructions (Signed)

## 2014-01-19 ENCOUNTER — Ambulatory Visit: Payer: Medicare Other | Admitting: Podiatry

## 2014-01-19 VITALS — BP 151/77 | HR 64 | Resp 16

## 2014-01-19 DIAGNOSIS — M79609 Pain in unspecified limb: Secondary | ICD-10-CM

## 2014-01-19 DIAGNOSIS — L600 Ingrowing nail: Secondary | ICD-10-CM

## 2014-01-19 NOTE — Patient Instructions (Signed)

## 2014-01-19 NOTE — Progress Notes (Signed)
Subjective:     Patient ID: Michael Kane, male   DOB: Mar 26, 1929, 80 y.o.   MRN: [id number]  HPI patient states I am ready to get this toenail fixed but never comes back again   Review of Systems     Objective:   Physical Exam Neurovascular status intact with pain in the second digit left foot with incurvation the lateral border and recovery from previous paronychia infection process    Assessment:     Ingrown toenail deformity second toe left lateral border    Plan:     Reviewed condition and explain permanent procedure and risk. Patient wants procedure and today I infiltrated 60 mg Xylocaine Marcaine mixture removed the lateral border of the second nail exposed matrix and applied phenol 3 applications followed by alcohol lavaged and sterile dressing

## 2014-01-29 ENCOUNTER — Encounter: Payer: Self-pay | Admitting: Gastroenterology

## 2014-02-11 DIAGNOSIS — Z23 Encounter for immunization: Secondary | ICD-10-CM | POA: Diagnosis not present

## 2014-05-14 ENCOUNTER — Encounter: Payer: Medicare Other | Admitting: Internal Medicine

## 2014-05-19 ENCOUNTER — Encounter: Payer: Self-pay | Admitting: Internal Medicine

## 2014-05-19 ENCOUNTER — Ambulatory Visit (INDEPENDENT_AMBULATORY_CARE_PROVIDER_SITE_OTHER): Payer: Medicare Other | Admitting: Internal Medicine

## 2014-05-19 VITALS — BP 144/82 | HR 57 | Temp 97.5°F | Ht 66.5 in | Wt 164.0 lb

## 2014-05-19 DIAGNOSIS — Z23 Encounter for immunization: Secondary | ICD-10-CM | POA: Diagnosis not present

## 2014-05-19 DIAGNOSIS — I10 Essential (primary) hypertension: Secondary | ICD-10-CM | POA: Diagnosis not present

## 2014-05-19 DIAGNOSIS — R251 Tremor, unspecified: Secondary | ICD-10-CM

## 2014-05-19 DIAGNOSIS — I350 Nonrheumatic aortic (valve) stenosis: Secondary | ICD-10-CM

## 2014-05-19 DIAGNOSIS — G3184 Mild cognitive impairment, so stated: Secondary | ICD-10-CM

## 2014-05-19 DIAGNOSIS — Z7189 Other specified counseling: Secondary | ICD-10-CM | POA: Insufficient documentation

## 2014-05-19 DIAGNOSIS — G250 Essential tremor: Secondary | ICD-10-CM

## 2014-05-19 DIAGNOSIS — G479 Sleep disorder, unspecified: Secondary | ICD-10-CM

## 2014-05-19 DIAGNOSIS — Z0000 Encounter for general adult medical examination without abnormal findings: Secondary | ICD-10-CM

## 2014-05-19 DIAGNOSIS — G252 Other specified forms of tremor: Secondary | ICD-10-CM

## 2014-05-19 LAB — COMPREHENSIVE METABOLIC PANEL
ALBUMIN: 4.1 g/dL (ref 3.5–5.2)
ALK PHOS: 70 U/L (ref 39–117)
ALT: 20 U/L (ref 0–53)
AST: 28 U/L (ref 0–37)
BILIRUBIN TOTAL: 0.4 mg/dL (ref 0.2–1.2)
BUN: 19 mg/dL (ref 6–23)
CHLORIDE: 102 meq/L (ref 96–112)
CO2: 28 mEq/L (ref 19–32)
Calcium: 9.3 mg/dL (ref 8.4–10.5)
Creatinine, Ser: 0.99 mg/dL (ref 0.40–1.50)
GFR: 76.7 mL/min (ref >60.00)
Glucose, Bld: 104 mg/dL — ABNORMAL HIGH (ref 70–99)
POTASSIUM: 4.1 meq/L (ref 3.5–5.1)
Sodium: 137 mEq/L (ref 135–145)
Total Protein: 7.1 g/dL (ref 6.0–8.3)

## 2014-05-19 LAB — CBC WITH DIFFERENTIAL/PLATELET
BASOS ABS: 0 10*3/uL (ref 0.0–0.1)
BASOS PCT: 0.2 % (ref 0.0–3.0)
EOS PCT: 1.8 % (ref 0.0–5.0)
Eosinophils Absolute: 0.1 10*3/uL (ref 0.0–0.7)
HCT: 45.6 % (ref 39.0–52.0)
Hemoglobin: 15.5 g/dL (ref 13.0–17.0)
Lymphocytes Relative: 31 % (ref 12.0–46.0)
Lymphs Abs: 2.1 10*3/uL (ref 0.7–4.0)
MCHC: 34 g/dL (ref 30.0–36.0)
MCV: 95.8 fl (ref 78.0–100.0)
MONOS PCT: 15.5 % — AB (ref 3.0–12.0)
Monocytes Absolute: 1.1 10*3/uL — ABNORMAL HIGH (ref 0.1–1.0)
Neutro Abs: 3.5 10*3/uL (ref 1.4–7.7)
Neutrophils Relative %: 51.5 % (ref 43.0–77.0)
Platelets: 255 10*3/uL (ref 150.0–400.0)
RBC: 4.76 Mil/uL (ref 4.22–5.81)
RDW: 14.2 % (ref 11.5–15.5)
WBC: 6.8 10*3/uL (ref 4.0–10.5)

## 2014-05-19 LAB — T4, FREE: Free T4: 1.04 ng/dL (ref 0.60–1.60)

## 2014-05-19 NOTE — Assessment & Plan Note (Signed)
See social history °Has DNR °

## 2014-05-19 NOTE — Assessment & Plan Note (Signed)
I have personally reviewed the Medicare Annual Wellness questionnaire and have noted 1. The patient's medical and social history 2. Their use of alcohol, tobacco or illicit drugs 3. Their current medications and supplements 4. The patient's functional ability including ADL's, fall risks, home safety risks and hearing or visual             impairment. 5. Diet and physical activities 6. Evidence for depression or mood disorders  The patients weight, height, BMI and visual acuity have been recorded in the chart I have made referrals, counseling and provided education to the patient based review of the above and I have provided the pt with a written personalized care plan for preventive services.  I have provided you with a copy of your personalized plan for preventive services. Please take the time to review along with your updated medication list.  No cancer screening due to age prevnar today UTD otherwise

## 2014-05-19 NOTE — Assessment & Plan Note (Signed)
?  slight recall decline but no functional change Continues to do mental and physical activity and is socially engaged

## 2014-05-19 NOTE — Assessment & Plan Note (Signed)
BP Readings from Last 3 Encounters:  05/19/14 144/82  01/19/14 151/77  01/05/14 178/78   This BP is fine for him I would even decrease the amlodipine if any dizziness

## 2014-05-19 NOTE — Assessment & Plan Note (Signed)
No symptoms Should accept higher systolic BP due to this

## 2014-05-19 NOTE — Addendum Note (Signed)
Addended by: Emelia Salisbury C on: 05/19/2014 02:35 PM   Modules accepted: Orders

## 2014-05-19 NOTE — Assessment & Plan Note (Signed)
Does well with the trazodone 

## 2014-05-19 NOTE — Assessment & Plan Note (Signed)
Stable or improved No features of Parkinsonism

## 2014-05-19 NOTE — Progress Notes (Signed)
Subjective:    Patient ID: Michael Kane, male    DOB: 11/13/1928, 81 y.o.   MRN: [id number]  HPI Here for Medicare wellness visit and follow up of multiple medical problems Reviewed form and advanced directives Here with wife Reviewed other physicians Ongoing cognitive problems--at most mild progressive Has 1 bourbon or scotch at 5PM and glass of wine. No tobacco Regular exercise Independent in instrumental ADLs Hearing is worsening--- has aides Vision is okay--not great. Due for recheck  No falls No depression or anhedonia  Wife notes mild decline in memory He is doing crossword puzzles Stays active physically  Still drives and does fine with this. Wife does bills. Has not given up any activities  Will have fecal urgency at times---occasional incontinence. No blood Goes regularly twice a day This only happens once a month No abdominal pain Will rarely use imodium if he feels like he might have a loose stool  No chest pain No dizziness--even getting up quickly (though he takes his time). No syncope No SOB or change in exercise tolerance (swims regularly and walks dog) No edema  Tremor seems better  Not bothering him No change in handwriting --- other than being slower Some slowing of all movement  Current Outpatient Prescriptions on File Prior to Visit  Medication Sig Dispense Refill  . amLODipine (NORVASC) 10 MG tablet TAKE ONE TABLET BY MOUTH ONCE DAILY FOR  HIGH  BLOOD  PRESSURE 90 tablet 3  . aspirin 81 MG tablet Take 81 mg by mouth daily.      . Multiple Vitamins-Minerals (MENS MULTI VITAMIN & MINERAL PO) Take by mouth daily.      . traZODone (DESYREL) 100 MG tablet TAKE TWO TABLETS BY MOUTH IN THE EVENING 60 tablet 11   No current facility-administered medications on file prior to visit.    Allergies  Allergen Reactions  . Lisinopril     REACTION: cough    Past Medical History  Diagnosis Date  . Tremor, essential   . History of prostate cancer    RT 2006  . OSA (obstructive sleep apnea)     on CPAP of 10  . Wolff-Parkinson-White syndrome 1970  . Rosacea   . Hypertension   . Hx of colonic polyps   . Mild cognitive impairment     Past Surgical History  Procedure Laterality Date  . Tonsillectomy    . Total knee arthroplasty Left 3/14    Family History  Problem Relation Age of Onset  . Stroke Brother     History   Social History  . Marital Status: Married    Spouse Name: N/A    Number of Children: 3  . Years of Education: N/A   Occupational History  . retired Charity fundraiser    Social History Main Topics  . Smoking status: Former Smoker    Types: Cigarettes    Quit date: 04/23/1958  . Smokeless tobacco: Never Used  . Alcohol Use: 0.0 oz/week    0 Not specified per week     Comment: 1 beer daily  . Drug Use: No  . Sexual Activity: Not on file   Other Topics Concern  . Not on file   Social History Narrative   Wrote poetry and  short stories.  Occ memoirs (has been published)      Has living will.    Requested DNR so form completed.    Requests wife as health care POA.    Would accept feeding tube  unless vegetative state            Review of Systems  Sleeps well--on the medication. Appetite is okay Weight is good No trouble voiding. Nocturia x 1 usually. Some increase in frequency lately. No hematuria Teeth fine--sees dentist regularly Wears seat belt    Objective:   Physical Exam  Constitutional: He is oriented to person, place, and time. He appears well-developed and well-nourished. No distress.  HENT:  Mouth/Throat: Oropharynx is clear and moist. No oropharyngeal exudate.  Neck: Normal range of motion. Neck supple. No thyromegaly present.  Cardiovascular: Normal rate, regular rhythm and intact distal pulses.  Exam reveals no gallop.   Gr 3/6 coarse systolic murmur across precordium and up towards right carotid  Pulmonary/Chest: Effort normal and breath sounds normal. No respiratory  distress. He has no wheezes. He has no rales.  Abdominal: Soft. There is no tenderness.  Musculoskeletal: He exhibits no edema or tenderness.  Lymphadenopathy:    He has no cervical adenopathy.  Neurological: He is alert and oriented to person, place, and time.  Slight tremor but normal tone.  No sig bradykinesia Gait is not shuffling  Skin: No rash noted. No erythema.  Psychiatric: He has a normal mood and affect. His behavior is normal.          Assessment & Plan:

## 2014-05-19 NOTE — Progress Notes (Signed)
Pre visit review using our clinic review tool, if applicable. No additional management support is needed unless otherwise documented below in the visit note. 

## 2014-05-21 ENCOUNTER — Encounter: Payer: Self-pay | Admitting: *Deleted

## 2014-08-09 DIAGNOSIS — H903 Sensorineural hearing loss, bilateral: Secondary | ICD-10-CM | POA: Diagnosis not present

## 2014-08-13 NOTE — Op Note (Signed)
PATIENT NAME:  Michael Kane, Michael Kane MR#:  [id number] DATE OF BIRTH:  May 12, 1928  DATE OF PROCEDURE:  07/09/2012  PREOPERATIVE DIAGNOSIS: Degenerative arthrosis of the left knee.   POSTOPERATIVE DIAGNOSIS: Degenerative arthrosis of the left knee.   PROCEDURE PERFORMED: Left total knee arthroplasty using computer-assisted navigation.   SURGEON: Laurice Record. Holley Bouche., MD  ASSISTANT: Rachelle Hora, PA-C.   ANESTHESIA: Femoral nerve block and spinal.   ESTIMATED BLOOD LOSS: 50 mL.   FLUIDS REPLACED: 1000 mL of crystalloid.   TOURNIQUET TIME: 88 minutes.   DRAINS: Two medium drains to reinfusion system.   SOFT TISSUE RELEASES: Anterior cruciate ligament, posterior cruciate ligament, deep medial collateral ligament and patellofemoral ligament.   IMPLANTS UTILIZED: DePuy PFC Sigma size 5 posterior stabilized femoral component (cemented), size 5 MBT tibial component (cemented), 41 mm 3 peg oval dome patella (cemented), and a 10 mm stabilized rotating platform polyethylene insert.   INDICATIONS FOR SURGERY: The patient is an 81 year old male who has been seen for complaints of progressive left knee pain. X-rays demonstrated severe degenerative changes in a tricompartmental fashion with relative varus deformity. The patient did not see any significant improvement despite conservative nonsurgical intervention. After discussion of the risks and benefits of surgical intervention, the patient expressed understanding of the risks and benefits and agreed with plans for surgical intervention.   PROCEDURE IN DETAIL: The patient was brought into the operating room, and after adequate femoral nerve block and spinal anesthesia was achieved, a tourniquet was placed on the patient's upper left thigh. The patient's left knee and leg were cleaned and prepped with alcohol and DuraPrep and draped in the usual sterile fashion. A "timeout" was performed as per usual protocol. The left lower extremity was exsanguinated using an  Esmarch, and the tourniquet was inflated to 300 mmHg. An anterior longitudinal incision was made, followed by a standard mid vastus approach. A small effusion was evacuated. The deep fibers of the medial collateral ligament were elevated in a subperiosteal fashion off the medial flare of the tibia so as to maintain a continuous soft tissue sleeve. The patella was subluxed laterally, and the patellofemoral ligament was incised. Inspection of the knee demonstrated severe degenerative changes in a tricompartmental fashion with full-thickness loss of articular cartilage to the medial compartment. Prominent osteophytes were debrided using a rongeur. Anterior and posterior cruciate ligaments were excised. Two 4.0 mm Schanz pins were inserted into the femur and into the tibia for attachment of the array of trackers used for computer-assisted navigation. Hip center was identified using circumduction technique. Distal landmarks were mapped using the computer. The distal femur and proximal tibia were mapped using the computer. Distal femoral cutting guide was positioned using computer-assisted navigation so as to achieve a 5-degree distal valgus cut. Cut was performed and verified using the computer. Distal femur was sized, and it was felt that a size 5 femoral component was appropriate. A size 5 cutting guide was positioned, and the anterior cut was performed and verified using the computer. This was followed by completion of the posterior and chamfer cuts. Femoral cutting guide for the central box was then positioned, and the central box cut was performed. Attention was then directed to the proximal tibia. Medial and lateral menisci were excised. The extramedullary tibial cutting guide was positioned using computer-assisted navigation so as to achieve 0-degree varus and valgus alignment and 0-degree posterior slope. Cut was performed and verified using the computer. The proximal tibia was sized, and it was felt that a size  5 tibial tray was appropriate. Tibial and femoral trials were inserted, followed by insertion of a 10 mm polyethylene insert. Good medial and lateral soft tissue balancing was appreciated both in extension and in flexion. Finally, the patella was cut and prepared so as to accommodate a 41 mm 3 peg oval dome patella. Patellar trial was placed, and the knee was placed through a range of motion, with excellent patellar tracking appreciated.   Femoral trial was removed after debridement of posterior osteophytes. The central posthole for the tibial component was reamed, followed by insertion of a keel punch. Tibial trial was then removed. The cut surfaces of bone were irrigated with copious amounts of normal saline with antibiotic solution using pulsatile lavage and then suctioned dry. Polymethylmethacrylate cement was prepared in the usual fashion using a vacuum mixer. Cement was applied to the cut surface of the proximal tibia as well as along the undersurface of a size 5 MBT tibial component. The tibial component was positioned and impacted into place. Excess cement was removed using [employer] fast streamer. Cement was then applied to the cut surface of the femur as well as along the posterior flanges of a size 5 posterior stabilized femoral component. Femoral component was positioned and impacted into place. Excess cement was removed using [employer] fast streamer. A 10 mm polyethylene trial was inserted, and the knee was brought in full extension with steady axial compression applied. Finally, cement was applied to the backside of a 41 mm 3 peg oval dome patella, and the patellar component was positioned and patellar clamp applied. Excess cement was removed using [employer] fast streamer.   After adequate curing of cement, the tourniquet was deflated after a total tourniquet time of 88 minutes. Hemostasis was achieved using electrocautery. The knee was irrigated with copious amounts of normal saline with antibiotic solution using  pulsatile lavage and then inspected for any residual cement debris. Then, 30 mL of 0.25% Marcaine with epinephrine was injected along the posterior capsule. A 10 mm stabilized rotating platform polyethylene insert was inserted, and the knee was placed through a range of motion, with excellent patellar tracking appreciated and excellent medial and lateral soft tissue balancing noted both in extension and in flexion. Two medium drains were placed in the wound bed and brought out through separate stab incisions to be attached to a reinfusion system. The medial parapatellar portion of the incision was reapproximated using interrupted sutures of #1 Vicryl. The subcutaneous tissue was approximated in layers using first #0 Vicryl, followed by #2-0 Vicryl. The skin was closed with skin staples. A sterile dressing was applied.   The patient tolerated the procedure well. He was transported to the recovery room in stable condition.    ____________________________ Laurice Record. Holley Bouche., MD jph:OSi D: 07/10/2012 06:18:30 ET T: 07/10/2012 07:17:39 ET JOB#: [id number]  cc: Jeneen Rinks P. Holley Bouche., MD, <Dictator> Laurice Record Holley Bouche MD ELECTRONICALLY SIGNED 07/11/2012 17:58

## 2014-08-13 NOTE — Discharge Summary (Signed)
PATIENT NAME:  Michael Kane, Michael Kane MR#:  [id number] DATE OF BIRTH:  1928-06-01  DATE OF ADMISSION:  07/09/2012 DATE OF DISCHARGE:  07/12/2012  ADMITTING DIAGNOSIS: Degenerative arthrosis of the left knee.   DISCHARGE DIAGNOSIS: Degenerative arthrosis of the left knee.   OPERATION: On 07/09/2012, the patient had a left total knee arthroplasty using computer-aided navigation.   SURGEON: Skip Estimable, M.D.   ASSISTANT: Rachelle Hora PA-C  ANESTHESIA: Femoral nerve block and spinal.   ESTIMATED BLOOD LOSS: 50 mL.  TOURNIQUET TIME: 88 minutes.   DRAINS: Two medium drains to reinfusion system.   IMPLANTS USED: DePuy PFC Sigma size 5 posterior stabilized femoral component that was cemented, size 5 MBT tibial component that was cemented, 41 mm 3-peg oval dome patella that was cemented, and a 10 mm rotating platform polyethylene insert.   The patient was stabilized after surgery, brought to the recovery room, and then brought down to the orthopedic floor for pain control and physical therapy.   HISTORY OF PRESENT ILLNESS:  The patient is an 81 year old male who presented for upcoming total knee replacement. The patient has continued having significant pain and has been refractory to conservative treatment including cortisone injections and activity modification. The patient has done swimming and had to discontinue this because of increased pain.   PHYSICAL EXAMINATION:  GENERAL: Well-developed, well-nourished male with an antalgic component, on the left knee. LUNGS: Clear to auscultation.  CARDIOVASCULAR: Regular rate and rhythm with a 3/6 murmur.  MUSCULOSKELETAL: In regard to the left lower extremity, the patient has full extension and 120 degrees of flexion. The patient has medial joint line tenderness. The patient has quadriceps tone that is intact.   HOSPITAL COURSE: After initial admission, on 07/09/2012, the patient was brought to the orthopedic floor. The following day, the patient's  hemoglobin was at 12.7, which remained stable there to 12.3, on postoperative day 2. The patient worked with physical therapy, initially bed to chair, and progressed up to ambulating 65 feet, on postoperative day 1, and had drain and Foley and IV removed and did well and progressed to being discharged to [facility] on 07/12/2012.   CONDITION AT DISCHARGE: Stable.   DISPOSITION: The patient was sent to rehab.   DISCHARGE INSTRUCTIONS:  The patient will follow up with [facility] orthopedics on 07/24/2012, with Vance Peper.  The patient will work with physical therapy doing gait training and range of motion activities and work with occupational therapy doing ADLs. The patient will do weight bear as tolerated. The patient will raise the foot and leg on 1 to 2 pillows and try to keep the heels off the bed. The patient will use thigh-high TED hose on both legs, to be removed 1 hour q. shift. The patient will be encouraged to do cough and deep breathing. Regular diet if tolerated is to be utilized. The patient will use his Polar Care to decrease swelling and try to keep his dressing clean and dry and have it changed on a p.r.n. basis. The rehab center will call the clinic if there is any bright red bleeding or calf pain or bowel or bladder difficulty or fever greater than 101.5.   DISCHARGE MEDICATIONS:  1.  Aspirin 81 mg p.o. daily in the morning.  2.  Tylenol 500 mg q. 4 hours p.r.n. for pain or fever greater than 100.4.  3.  Oxycodone 5 mg q. 4 hours p.r.n. for severe pain.  4.  Tramadol 50 mg q. 4 hours p.r.n. for less  severe pain.  5.  Celebrex 200 mg p.o. b.i.d.  6.  Magnesium hydroxide suspension 30 mL 2 times a day for constipation.  7.  Lovenox 30 mg sub-Q every 12 hours x 14 days.  8.  Trazodone 100 mg 2 tablets daily at nighttime. 9.  Loperamide 2 mg 1 capsule q. 4 hours as needed for diarrhea. 10.  Amlodipine 10 mg 1 tablet p.o. daily in the morning. 11.  Bisacodyl 10 mg rectally p.r.n.   12.  Docusate senna with Senokot-S 1 tablet p.o. b.i.d. for constipation. 13.  Pantoprazole 40 mg 1 tablet b.i.d.  14.  Multivitamin 1 tablet daily.  ____________________________ Lenna Sciara. Reche Dixon, [place] jtm:sb D: 07/11/2012 07:47:00 ET T: 07/11/2012 08:17:35 ET JOB#: [id number]  cc: J. Reche Dixon, [place], <Dictator> J Charlisha Market [facility] PA ELECTRONICALLY SIGNED 07/29/2012 7:40

## 2014-08-26 ENCOUNTER — Other Ambulatory Visit: Payer: Self-pay | Admitting: Internal Medicine

## 2014-09-01 ENCOUNTER — Other Ambulatory Visit: Payer: Self-pay | Admitting: Internal Medicine

## 2014-09-22 ENCOUNTER — Telehealth: Payer: Self-pay | Admitting: Internal Medicine

## 2014-09-22 NOTE — Telephone Encounter (Signed)
Patient Name: CECILIO OHLRICH  DOB: [birth year]    Initial Comment Caller states he is having chest pain. Has been having them randomly for a couple of days    Nurse Assessment  Nurse: Mallie Mussel, RN, Alveta Heimlich Date/Time Eilene Ghazi Time): 09/22/2014 2:11:39 PM  Confirm and document reason for call. If symptomatic, describe symptoms. ---Caller states that he has been having dull achy pain under his left nipple area. He has a "slight" bit of it now. It has been increasing in its intensity. Denies difficulty breathing.  Has the patient traveled out of the country within the last 30 days? ---No  Does the patient require triage? ---Yes  Related visit to physician within the last 2 weeks? ---No  Does the PT have any chronic conditions? (i.e. diabetes, asthma, etc.) ---Yes  List chronic conditions. ---HTN     Guidelines    Guideline Title Affirmed Question Affirmed Notes  Chest Pain [1] Chest pain lasts > 5 minutes AND [2] age > 50 1/2 hour to 1 hour   Final Disposition User   [place], RN, Alveta Heimlich   Reason: Caller declines to call 911, he is just wanting to be seen by Dr. Silvio Pate. Advsied him I will forward the information to the office and someone should be calling him back. He verbalized understanding.

## 2014-09-22 NOTE — Telephone Encounter (Signed)
He is probably having stress with all his wife is going through. Make sure his symptoms are not severe and he is breathing okay. If he is having problems, bring him in for tomorrow

## 2014-09-22 NOTE — Telephone Encounter (Signed)
Noted, thanks!

## 2014-09-22 NOTE — Telephone Encounter (Signed)
Patient notified as instructed by telephone and verbalized understanding. Patient stated that he does not feel that he needs to go to the ER and the chest pain that he is having is due to stress. Patient stated that the pain is not severe, no SOB or difficulty breathing, no nausea or any other symptoms.  Appointment scheduled with Dr. Abelardo Diesel tomorrow 09/23/14 at 2:00. Patient agreed that if symptoms get worse before the appointment he will call 911 or go to the ER.

## 2014-09-22 NOTE — Telephone Encounter (Signed)
Please see what other details you can get.  Thanks.

## 2014-09-23 ENCOUNTER — Encounter: Payer: Self-pay | Admitting: Internal Medicine

## 2014-09-23 ENCOUNTER — Ambulatory Visit (INDEPENDENT_AMBULATORY_CARE_PROVIDER_SITE_OTHER): Payer: Medicare Other | Admitting: Internal Medicine

## 2014-09-23 VITALS — BP 158/80 | HR 47 | Wt 164.0 lb

## 2014-09-23 DIAGNOSIS — R079 Chest pain, unspecified: Secondary | ICD-10-CM

## 2014-09-23 NOTE — Telephone Encounter (Signed)
Will check out today--probably just stress

## 2014-09-23 NOTE — Progress Notes (Signed)
Subjective:    Patient ID: Michael Kane, male    DOB: 16-Sep-1928, 81 y.o.   MRN: [id number]  HPI  Here due to chest pain Wife just diagnosed with brain tumor--lots of stress as she works through her testing and evaluation  Has had a tightening across chest bilaterally---associated with constant achy feeling Only during day-not at night Will happen with walking or at rest May last for an hour--he does feel better if he sits (but might come on when sitting) No palpitations No SOB Does have some dizziness ---mostly if out walking. No syncope. No dizziness when first standing up (or perhaps occasionally)  Current Outpatient Prescriptions on File Prior to Visit  Medication Sig Dispense Refill  . amLODipine (NORVASC) 10 MG tablet TAKE ONE TABLET BY MOUTH ONCE DAILY FOR HIGH BLOOD PRESSURE 90 tablet 3  . aspirin 81 MG tablet Take 81 mg by mouth daily.      . Multiple Vitamins-Minerals (MENS MULTI VITAMIN & MINERAL PO) Take by mouth daily.      . traZODone (DESYREL) 100 MG tablet TAKE TWO TABLETS BY MOUTH IN THE EVENING 60 tablet 11   No current facility-administered medications on file prior to visit.    Allergies  Allergen Reactions  . Lisinopril     REACTION: cough    Past Medical History  Diagnosis Date  . Tremor, essential   . History of prostate cancer     RT 2006  . OSA (obstructive sleep apnea)     on CPAP of 10  . Wolff-Parkinson-White syndrome 1970  . Rosacea   . Hypertension   . Hx of colonic polyps   . Mild cognitive impairment     Past Surgical History  Procedure Laterality Date  . Tonsillectomy    . Total knee arthroplasty Left 3/14    Family History  Problem Relation Age of Onset  . Stroke Brother     History   Social History  . Marital Status: Married    Spouse Name: N/A  . Number of Children: 3  . Years of Education: N/A   Occupational History  . retired Charity fundraiser    Social History Main Topics  . Smoking status: Former  Smoker    Types: Cigarettes    Quit date: 04/23/1958  . Smokeless tobacco: Never Used  . Alcohol Use: 0.0 oz/week    0 Standard drinks or equivalent per week     Comment: 1 beer daily  . Drug Use: No  . Sexual Activity: Not on file   Other Topics Concern  . Not on file   Social History Narrative   Wrote poetry and  short stories.  Occ memoirs (has been published)      Has living will.    Requested DNR so form completed.    Requests wife as health care POA.    Would accept feeding tube unless vegetative state            Review of Systems No fever or cough Appetite is good Sleeping okay    Objective:   Physical Exam  Constitutional: He appears well-developed and well-nourished. No distress.  Neck: Normal range of motion. Neck supple. No thyromegaly present.  Cardiovascular: Normal rate and regular rhythm.  Exam reveals no gallop.   Gr 2/6 systolic murmur heard throughout  Pulmonary/Chest: Effort normal and breath sounds normal. No respiratory distress. He has no wheezes. He has no rales.  Abdominal: Soft. There is no tenderness.  Musculoskeletal: He  exhibits no edema.  Lymphadenopathy:    He has no cervical adenopathy.  Psychiatric: He has a normal mood and affect. His behavior is normal.          Assessment & Plan:

## 2014-09-23 NOTE — Assessment & Plan Note (Addendum)
Atypical for coronary ischemia but certainly possible Has mild AS---could be related to that but the timing of starting after wife's diagnosis argues against all this EKG shows sinus bradycardia--no real change since 2014 Will hold off on meds--- consider nitroglycerin if persists, though high risk for headache and dizziness Consider stress test if persists He had stopped the aspirin--will have him restart

## 2014-09-23 NOTE — Progress Notes (Signed)
Pre visit review using our clinic review tool, if applicable. No additional management support is needed unless otherwise documented below in the visit note. 

## 2014-09-24 ENCOUNTER — Telehealth: Payer: Self-pay | Admitting: Internal Medicine

## 2014-09-24 NOTE — Telephone Encounter (Signed)
Unidentified message---so just left vague message for her to call back if she thinks things are emergent--or I will try her on Monday

## 2014-09-24 NOTE — Telephone Encounter (Signed)
Daughter Roni Bread called and is concerned over her dad's anxiety.  Best number to reach daughter is [phone] / lt

## 2014-09-27 NOTE — Telephone Encounter (Signed)
Message left again Asked her to leave number that I can reach her at or identify herself on the message so I can at least leave a message on the machine about her father

## 2015-02-17 DIAGNOSIS — Z23 Encounter for immunization: Secondary | ICD-10-CM | POA: Diagnosis not present

## 2015-02-23 ENCOUNTER — Encounter: Payer: Self-pay | Admitting: Internal Medicine

## 2015-02-23 ENCOUNTER — Ambulatory Visit (INDEPENDENT_AMBULATORY_CARE_PROVIDER_SITE_OTHER): Payer: Medicare Other | Admitting: Internal Medicine

## 2015-02-23 VITALS — BP 148/80 | HR 56 | Wt 161.0 lb

## 2015-02-23 DIAGNOSIS — B353 Tinea pedis: Secondary | ICD-10-CM | POA: Diagnosis not present

## 2015-02-23 DIAGNOSIS — M545 Low back pain, unspecified: Secondary | ICD-10-CM | POA: Insufficient documentation

## 2015-02-23 DIAGNOSIS — R350 Frequency of micturition: Secondary | ICD-10-CM

## 2015-02-23 LAB — POCT URINALYSIS DIPSTICK
Bilirubin, UA: NEGATIVE
Glucose, UA: NEGATIVE
KETONES UA: NEGATIVE
LEUKOCYTES UA: NEGATIVE
NITRITE UA: NEGATIVE
PH UA: 7
Spec Grav, UA: 1.015
Urobilinogen, UA: NEGATIVE

## 2015-02-23 MED ORDER — KETOCONAZOLE 2 % EX CREA: 1.0000 "application " | TOPICAL_CREAM | Freq: Two times a day (BID) | CUTANEOUS | Status: DC | PRN

## 2015-02-23 MED ORDER — FLUCONAZOLE 100 MG PO TABS: 100.0000 mg | ORAL_TABLET | Freq: Every day | ORAL | Status: DC

## 2015-02-23 NOTE — Progress Notes (Signed)
Pre visit review using our clinic review tool, if applicable. No additional management support is needed unless otherwise documented below in the visit note. 

## 2015-02-23 NOTE — Assessment & Plan Note (Signed)
This seems to be muscular Not in kidney area Urinalysis okay Discussed supportive care

## 2015-02-23 NOTE — Assessment & Plan Note (Signed)
Will treat with fluconazole and topical

## 2015-02-23 NOTE — Progress Notes (Signed)
   Subjective:    Patient ID: Michael Kane, male    DOB: 30-Jul-1928, 81 y.o.   MRN: [id number]  HPI Here with several concerns  Has rash and flaking on plantar feet Burning and itching Worsening over the past month Tried OTC ointment-- no help (antibiotic)  Having pain in lower back---fairly constant Sometimes sharp Bilateral upper lumbar More frequent urination-- as much as 4 times nocturia No dysuria, hematuria No history of kidney stones No fever  Current Outpatient Prescriptions on File Prior to Visit  Medication Sig Dispense Refill  . amLODipine (NORVASC) 10 MG tablet TAKE ONE TABLET BY MOUTH ONCE DAILY FOR HIGH BLOOD PRESSURE 90 tablet 3  . aspirin 81 MG tablet Take 81 mg by mouth daily.      . Multiple Vitamins-Minerals (MENS MULTI VITAMIN & MINERAL PO) Take by mouth daily.      . traZODone (DESYREL) 100 MG tablet TAKE TWO TABLETS BY MOUTH IN THE EVENING 60 tablet 11   No current facility-administered medications on file prior to visit.    Allergies  Allergen Reactions  . Lisinopril     REACTION: cough    Past Medical History  Diagnosis Date  . Tremor, essential   . History of prostate cancer     RT 2006  . OSA (obstructive sleep apnea)     on CPAP of 10  . Wolff-Parkinson-White syndrome 1970  . Rosacea   . Hypertension   . Hx of colonic polyps   . Mild cognitive impairment     Past Surgical History  Procedure Laterality Date  . Tonsillectomy    . Total knee arthroplasty Left 3/14    Family History  Problem Relation Age of Onset  . Stroke Brother     Social History   Social History  . Marital Status: Married    Spouse Name: N/A  . Number of Children: 3  . Years of Education: N/A   Occupational History  . retired Charity fundraiser    Social History Main Topics  . Smoking status: Former Smoker    Types: Cigarettes    Quit date: 04/23/1958  . Smokeless tobacco: Never Used  . Alcohol Use: 0.0 oz/week    0 Standard drinks or  equivalent per week     Comment: 1 beer daily  . Drug Use: No  . Sexual Activity: Not on file   Other Topics Concern  . Not on file   Social History Narrative   Wrote poetry and  short stories.  Occ memoirs (has been published)      Has living will.    Requested DNR so form completed.    Requests wife as health care POA.    Would accept feeding tube unless vegetative state            Review of Systems Appetite is okay No vomiting or diarrhea Stress with wife in health care--- mood has been okay under the circumstances    Objective:   Physical Exam  Constitutional: He appears well-developed. No distress.  Abdominal: Soft. There is no tenderness.  Musculoskeletal:  Slight lateral tenderness--actually lower than he pointed- at about L3-4 No spine tenderness Fairly normal flexion in back  Skin:  Scaling on plantar feet and between toes and mycotic toenails          Assessment & Plan:

## 2015-02-23 NOTE — Assessment & Plan Note (Signed)
Urinalysis shows hematuria--but microscopic negative for RBCs Likely related to prostate-- had RT for cancer in past Not bad enough for Rx

## 2015-05-25 ENCOUNTER — Encounter: Payer: Medicare Other | Admitting: Internal Medicine

## 2015-05-27 ENCOUNTER — Telehealth: Payer: Self-pay | Admitting: Internal Medicine

## 2015-05-27 ENCOUNTER — Encounter: Payer: Self-pay | Admitting: Internal Medicine

## 2015-05-27 ENCOUNTER — Ambulatory Visit (INDEPENDENT_AMBULATORY_CARE_PROVIDER_SITE_OTHER): Payer: Medicare Other | Admitting: Internal Medicine

## 2015-05-27 VITALS — BP 130/70 | HR 59 | Temp 98.1°F | Ht 67.0 in | Wt 164.0 lb

## 2015-05-27 DIAGNOSIS — F39 Unspecified mood [affective] disorder: Secondary | ICD-10-CM | POA: Insufficient documentation

## 2015-05-27 DIAGNOSIS — Z7189 Other specified counseling: Secondary | ICD-10-CM

## 2015-05-27 DIAGNOSIS — G3184 Mild cognitive impairment, so stated: Secondary | ICD-10-CM | POA: Diagnosis not present

## 2015-05-27 DIAGNOSIS — R413 Other amnesia: Secondary | ICD-10-CM | POA: Diagnosis not present

## 2015-05-27 DIAGNOSIS — Z0000 Encounter for general adult medical examination without abnormal findings: Secondary | ICD-10-CM

## 2015-05-27 DIAGNOSIS — I10 Essential (primary) hypertension: Secondary | ICD-10-CM | POA: Diagnosis not present

## 2015-05-27 DIAGNOSIS — I700 Atherosclerosis of aorta: Secondary | ICD-10-CM

## 2015-05-27 DIAGNOSIS — IMO0001 Reserved for inherently not codable concepts without codable children: Secondary | ICD-10-CM

## 2015-05-27 LAB — CBC WITH DIFFERENTIAL/PLATELET
Basophils Absolute: 0 10*3/uL (ref 0.0–0.1)
Basophils Relative: 0.3 % (ref 0.0–3.0)
Eosinophils Absolute: 0.1 10*3/uL (ref 0.0–0.7)
Eosinophils Relative: 1.6 % (ref 0.0–5.0)
HEMATOCRIT: 45.3 % (ref 39.0–52.0)
Hemoglobin: 14.9 g/dL (ref 13.0–17.0)
LYMPHS PCT: 27.4 % (ref 12.0–46.0)
Lymphs Abs: 2.4 10*3/uL (ref 0.7–4.0)
MCHC: 32.9 g/dL (ref 30.0–36.0)
MCV: 95.4 fl (ref 78.0–100.0)
Monocytes Absolute: 1 10*3/uL (ref 0.1–1.0)
Monocytes Relative: 11.2 % (ref 3.0–12.0)
NEUTROS PCT: 59.5 % (ref 43.0–77.0)
Neutro Abs: 5.2 10*3/uL (ref 1.4–7.7)
PLATELETS: 258 10*3/uL (ref 150.0–400.0)
RBC: 4.74 Mil/uL (ref 4.22–5.81)
RDW: 14.3 % (ref 11.5–15.5)
WBC: 8.7 10*3/uL (ref 4.0–10.5)

## 2015-05-27 LAB — COMPREHENSIVE METABOLIC PANEL
ALBUMIN: 4.1 g/dL (ref 3.5–5.2)
ALK PHOS: 73 U/L (ref 39–117)
ALT: 20 U/L (ref 0–53)
AST: 25 U/L (ref 0–37)
BILIRUBIN TOTAL: 0.4 mg/dL (ref 0.2–1.2)
BUN: 20 mg/dL (ref 6–23)
CALCIUM: 9.4 mg/dL (ref 8.4–10.5)
CO2: 31 meq/L (ref 19–32)
CREATININE: 0.92 mg/dL (ref 0.40–1.50)
Chloride: 102 mEq/L (ref 96–112)
GFR: 83.26 mL/min (ref >60.00)
Glucose, Bld: 100 mg/dL — ABNORMAL HIGH (ref 70–99)
Potassium: 4 mEq/L (ref 3.5–5.1)
Sodium: 139 mEq/L (ref 135–145)
TOTAL PROTEIN: 7.3 g/dL (ref 6.0–8.3)

## 2015-05-27 LAB — VITAMIN B12: VITAMIN B 12: 511 pg/mL (ref 211–911)

## 2015-05-27 LAB — T4, FREE: FREE T4: 1.23 ng/dL (ref 0.60–1.60)

## 2015-05-27 MED ORDER — TETANUS-DIPHTHERIA TOXOIDS TD 5-2 LFU IM INJ: 0.5000 mL | INJECTION | Freq: Once | INTRAMUSCULAR | Status: DC

## 2015-05-27 NOTE — Assessment & Plan Note (Signed)
Has DNR 

## 2015-05-27 NOTE — Assessment & Plan Note (Addendum)
I have personally reviewed the Medicare Annual Wellness questionnaire and have noted 1. The patient's medical and social history 2. Their use of alcohol, tobacco or illicit drugs 3. Their current medications and supplements 4. The patient's functional ability including ADL's, fall risks, home safety risks and hearing or visual             impairment. 5. Diet and physical activities 6. Evidence for depression or mood disorders  The patients weight, height, BMI and visual acuity have been recorded in the chart I have made referrals, counseling and provided education to the patient based review of the above and I have provided the pt with a written personalized care plan for preventive services.  I have provided you with a copy of your personalized plan for preventive services. Please take the time to review along with your updated medication list.  Doing okay Rx for Td No cancer screening due to age (even on PSA since years out from prostate cancer) Hopes to get back to swimming

## 2015-05-27 NOTE — Progress Notes (Signed)
Pre visit review using our clinic review tool, if applicable. No additional management support is needed unless otherwise documented below in the visit note. 

## 2015-05-27 NOTE — Assessment & Plan Note (Signed)
Has been fairly stable and is functionally independent

## 2015-05-27 NOTE — Assessment & Plan Note (Signed)
With mild stenosis No symptoms so no action needed

## 2015-05-27 NOTE — Assessment & Plan Note (Signed)
BP Readings from Last 3 Encounters:  05/27/15 130/70  02/23/15 148/80  09/23/14 158/80   Good control No changes needed

## 2015-05-27 NOTE — Patient Instructions (Signed)
Please ask for your tetanus shot at the pharmacy

## 2015-05-27 NOTE — Addendum Note (Signed)
Addended by: Daralene Milch C on: 05/27/2015 01:50 PM   Modules accepted: Miquel Dunn

## 2015-05-27 NOTE — Assessment & Plan Note (Signed)
Regular depressed mood due to grieving No MDD so will hold off on meds Discussed getting out more

## 2015-05-27 NOTE — Progress Notes (Signed)
Subjective:    Patient ID: Michael Kane, male    DOB: 1928-05-07, 82 y.o.   MRN: [id number]  HPI Here for Medicare wellness and follow up of chronic medical conditions Reviewed form and advanced directives Reviewed other doctors-- needs a dentist (daughter set something up). No eye doctor Has small glass of wine daily No tobacco Walks daily and some exercise Vision okay. Hearing aides help for loss No falls Independent with instrumental ADLs Ongoing memory issues--he feels it has progressed somewhat Feels down regularly--but all related to grieving  He is adjusting to being widowed Family around and check on him frequently via phone Limited social interactions at [facility] though--keeping to himself Gets out daily walking dog 2 cats that sleep with him every night He still drives--does shopping, etc but most meals at [facility]  No chest pain No palpitations No dizziness or syncope--but may have fleeting sensation if he moves quick No SOB No edema  Current Outpatient Prescriptions on File Prior to Visit  Medication Sig Dispense Refill  . amLODipine (NORVASC) 10 MG tablet TAKE ONE TABLET BY MOUTH ONCE DAILY FOR HIGH BLOOD PRESSURE 90 tablet 3  . aspirin 81 MG tablet Take 81 mg by mouth daily.      . Multiple Vitamins-Minerals (MENS MULTI VITAMIN & MINERAL PO) Take by mouth daily.      . traZODone (DESYREL) 100 MG tablet TAKE TWO TABLETS BY MOUTH IN THE EVENING 60 tablet 11   No current facility-administered medications on file prior to visit.    Allergies  Allergen Reactions  . Lisinopril     REACTION: cough    Past Medical History  Diagnosis Date  . Tremor, essential   . History of prostate cancer     RT 2006  . OSA (obstructive sleep apnea)     on CPAP of 10  . Wolff-Parkinson-White syndrome 1970  . Rosacea   . Hypertension   . Hx of colonic polyps   . Mild cognitive impairment     Past Surgical History  Procedure Laterality Date  . Tonsillectomy      . Total knee arthroplasty Left 3/14    Family History  Problem Relation Age of Onset  . Stroke Brother     Social History   Social History  . Marital Status: Widowed    Spouse Name: N/A  . Number of Children: 3  . Years of Education: N/A   Occupational History  . retired Charity fundraiser    Social History Main Topics  . Smoking status: Former Smoker    Types: Cigarettes    Quit date: 04/23/1958  . Smokeless tobacco: Never Used  . Alcohol Use: 0.0 oz/week    0 Standard drinks or equivalent per week     Comment: 1 beer daily  . Drug Use: No  . Sexual Activity: Not on file   Other Topics Concern  . Not on file   Social History Narrative   Wrote poetry and  short stories.  Occ memoirs (has been published)      Has living will.    Requested DNR so form completed.    Requests daughter Michael Kane as health care POA.    Would accept feeding tube unless vegetative state            Review of Systems Feet are better Appetite is good Weight down a bit with the grieving--had lost interest in eating. Has recovered this for the most part Sleeps okay--with the trazodone Wears  seat belt Having trouble with teeth--has appt No back or joint pain No suspicious lesions on skin Not trouble with and doesn't notice any tremor    Objective:   Physical Exam  Constitutional: He is oriented to person, place, and time. He appears well-developed and well-nourished. No distress.  HENT:  Mouth/Throat: Oropharynx is clear and moist. No oropharyngeal exudate.  Neck: Normal range of motion. Neck supple. No thyromegaly present.  Cardiovascular: Normal rate, regular rhythm and intact distal pulses.  Exam reveals no gallop.   Gr 2/6 coarse systolic aortic murmur  Pulmonary/Chest: Effort normal and breath sounds normal. No respiratory distress. He has no wheezes. He has no rales.  Abdominal: Soft. Bowel sounds are normal. There is no tenderness.  Musculoskeletal: He exhibits no edema  or tenderness.  Lymphadenopathy:    He has no cervical adenopathy.  Neurological: He is alert and oriented to person, place, and time.  President-- "Trump, ?" [id number] D-l-r-[id number]-w Recall 0/3  Skin: No rash noted. No erythema.  Psychiatric: He has a normal mood and affect. His behavior is normal.          Assessment & Plan:

## 2015-05-27 NOTE — Telephone Encounter (Signed)
kendal pt daughter dropped off fmla paperwork  She filled out most of the information In Dr Alla German IN BOX  For review and signature

## 2015-05-30 NOTE — Telephone Encounter (Signed)
No charge She did pretty much all the work for me

## 2015-05-30 NOTE — Telephone Encounter (Signed)
Left message letting kendal know paperwork has been mailed 05/30/15 Copy to pt Copy for scan Copy for file Paperwork has not been faxed anywhere

## 2015-05-31 DIAGNOSIS — Z23 Encounter for immunization: Secondary | ICD-10-CM | POA: Diagnosis not present

## 2015-07-19 ENCOUNTER — Encounter: Payer: Self-pay | Admitting: Internal Medicine

## 2015-07-19 ENCOUNTER — Ambulatory Visit (INDEPENDENT_AMBULATORY_CARE_PROVIDER_SITE_OTHER): Payer: Medicare Other | Admitting: Internal Medicine

## 2015-07-19 VITALS — BP 140/70 | HR 62 | Temp 97.3°F | Wt 162.0 lb

## 2015-07-19 DIAGNOSIS — L570 Actinic keratosis: Secondary | ICD-10-CM | POA: Diagnosis not present

## 2015-07-19 DIAGNOSIS — L719 Rosacea, unspecified: Secondary | ICD-10-CM

## 2015-07-19 MED ORDER — METRONIDAZOLE 0.75 % EX GEL
1.0000 | Freq: Two times a day (BID) | CUTANEOUS | Status: DC
Start: 2015-07-19 — End: 2017-08-01

## 2015-07-19 NOTE — Progress Notes (Signed)
Pre visit review using our clinic review tool, if applicable. No additional management support is needed unless otherwise documented below in the visit note. 

## 2015-07-19 NOTE — Assessment & Plan Note (Signed)
Will try metronidazole gel If not improving--will set up with dermatologist

## 2015-07-19 NOTE — Progress Notes (Signed)
   Subjective:    Patient ID: Michael Kane, male    DOB: 1929-02-03, 82 y.o.   MRN: [id number]  HPI Here due to lesion on his nose  Multiple bumps on tip of nose Goes back at least 1-2 weeks Hasn't put anything on it Itched at first--then worse after scratching  No pain  Current Outpatient Prescriptions on File Prior to Visit  Medication Sig Dispense Refill  . amLODipine (NORVASC) 10 MG tablet TAKE ONE TABLET BY MOUTH ONCE DAILY FOR HIGH BLOOD PRESSURE 90 tablet 3  . aspirin 81 MG tablet Take 81 mg by mouth daily.      . Multiple Vitamins-Minerals (MENS MULTI VITAMIN & MINERAL PO) Take by mouth daily.      . traZODone (DESYREL) 100 MG tablet TAKE TWO TABLETS BY MOUTH IN THE EVENING 60 tablet 11   No current facility-administered medications on file prior to visit.    Allergies  Allergen Reactions  . Lisinopril     REACTION: cough    Past Medical History  Diagnosis Date  . Tremor, essential   . History of prostate cancer     RT 2006  . OSA (obstructive sleep apnea)     on CPAP of 10  . Wolff-Parkinson-White syndrome 1970  . Rosacea   . Hypertension   . Hx of colonic polyps   . Mild cognitive impairment     Past Surgical History  Procedure Laterality Date  . Tonsillectomy    . Total knee arthroplasty Left 3/14    Family History  Problem Relation Age of Onset  . Stroke Brother     Social History   Social History  . Marital Status: Widowed    Spouse Name: N/A  . Number of Children: 3  . Years of Education: N/A   Occupational History  . retired Charity fundraiser    Social History Main Topics  . Smoking status: Former Smoker    Types: Cigarettes    Quit date: 04/23/1958  . Smokeless tobacco: Never Used  . Alcohol Use: 0.0 oz/week    0 Standard drinks or equivalent per week     Comment: 1 beer daily  . Drug Use: No  . Sexual Activity: Not on file   Other Topics Concern  . Not on file   Social History Narrative   Wrote poetry and  short  stories.  Occ memoirs (has been published)      Has living will.    Requested DNR so form completed.    Requests daughter [name] as health care POA.    Would accept feeding tube unless vegetative state            Review of Systems Feels well No fever    Objective:   Physical Exam  Constitutional: He appears well-developed and well-nourished. No distress.  Skin:  Swelling with redness over 1/2 of distal nose 2 scabbed lesions on tip of nose--inflamed          Assessment & Plan:

## 2015-07-19 NOTE — Assessment & Plan Note (Signed)
2 lesions on tip of nose treated with liquid nitrogen 45 second x 2 If they recur, I will set up with derm also

## 2015-07-19 NOTE — Patient Instructions (Signed)
I treated 2 abnormal spots on the tip of your nose. Those will look worse and open up, then should heal within 2-3 weeks. If they don't seem to be better--I will set you up with the dermatologist. You have rosacea--that red swelling on your nose. Please put the metronidazole gel on the red areas twice a day. If it is not better in a month or so, I will send you to the dermatologist for that. You should protect your face from direct sunlight.

## 2015-10-03 ENCOUNTER — Other Ambulatory Visit: Payer: Self-pay | Admitting: Internal Medicine

## 2015-10-06 ENCOUNTER — Ambulatory Visit (INDEPENDENT_AMBULATORY_CARE_PROVIDER_SITE_OTHER): Payer: Medicare Other | Admitting: Internal Medicine

## 2015-10-06 ENCOUNTER — Encounter: Payer: Self-pay | Admitting: Internal Medicine

## 2015-10-06 VITALS — BP 150/74 | HR 58 | Temp 98.1°F | Wt 163.0 lb

## 2015-10-06 DIAGNOSIS — L821 Other seborrheic keratosis: Secondary | ICD-10-CM | POA: Diagnosis not present

## 2015-10-06 DIAGNOSIS — L719 Rosacea, unspecified: Secondary | ICD-10-CM

## 2015-10-06 MED ORDER — DOXYCYCLINE HYCLATE 100 MG PO TABS: 100.0000 mg | ORAL_TABLET | Freq: Every day | ORAL | Status: DC

## 2015-10-06 NOTE — Progress Notes (Signed)
Pre visit review using our clinic review tool, if applicable. No additional management support is needed unless otherwise documented below in the visit note. 

## 2015-10-06 NOTE — Assessment & Plan Note (Addendum)
In past, med was too expensive Will try Rx again---oral for 1-2 weeks at a time

## 2015-10-06 NOTE — Assessment & Plan Note (Signed)
Reassured--benign No action needed

## 2015-10-06 NOTE — Patient Instructions (Addendum)
Please call the audiologist at [facility] for a full evaluation of your hearing. Please try the oral antibiotic, doxycycline, daily for 1-2 weeks to calm down the nose. Then skip some weeks. You should probably use it 1-2 weeks per month only.

## 2015-10-06 NOTE — Progress Notes (Signed)
   Subjective:    Patient ID: Michael Kane, male    DOB: [birth year], 82 y.o.   MRN: [id number]  HPI Here due to concern for big black spots on back and trunk Has just noticed them recently No itching, pain, inflammation No new exposures  Concerned about hearing Feels the hearing aides are not working so well  Current Outpatient Prescriptions on File Prior to Visit  Medication Sig Dispense Refill  . amLODipine (NORVASC) 10 MG tablet TAKE 1 TABLET BY MOUTH DAILY 90 tablet 2  . aspirin 81 MG tablet Take 81 mg by mouth daily.      . metroNIDAZOLE (METROGEL) 0.75 % gel Apply 1 application topically 2 (two) times daily. 45 g 3  . Multiple Vitamins-Minerals (MENS MULTI VITAMIN & MINERAL PO) Take by mouth daily.      . traZODone (DESYREL) 100 MG tablet TAKE TWO TABLETS BY MOUTH IN THE EVENING 60 tablet 11   No current facility-administered medications on file prior to visit.    Allergies  Allergen Reactions  . Lisinopril     REACTION: cough    Past Medical History  Diagnosis Date  . Tremor, essential   . History of prostate cancer     RT 2006  . OSA (obstructive sleep apnea)     on CPAP of 10  . Wolff-Parkinson-White syndrome 1970  . Rosacea   . Hypertension   . Hx of colonic polyps   . Mild cognitive impairment     Past Surgical History  Procedure Laterality Date  . Tonsillectomy    . Total knee arthroplasty Left 3/14    Family History  Problem Relation Age of Onset  . Stroke Brother     Social History   Social History  . Marital Status: Widowed    Spouse Name: N/A  . Number of Children: 3  . Years of Education: N/A   Occupational History  . retired Charity fundraiser    Social History Main Topics  . Smoking status: Former Smoker    Types: Cigarettes    Quit date: 04/23/1958  . Smokeless tobacco: Never Used  . Alcohol Use: 0.0 oz/week    0 Standard drinks or equivalent per week     Comment: 1 beer daily  . Drug Use: No  . Sexual Activity: Not  on file   Other Topics Concern  . Not on file   Social History Narrative   Wrote poetry and  short stories.  Occ memoirs (has been published)      Has living will.    Requested DNR so form completed.    Requests daughter [name] as health care POA.    Would accept feeding tube unless vegetative state            Review of Systems Not sick No fever    Objective:   Physical Exam  Skin:  Multiple large seborrheic keratoses on back and abdomen/chest.  Classic rosacea of nose          Assessment & Plan:

## 2015-10-18 ENCOUNTER — Other Ambulatory Visit: Payer: Self-pay | Admitting: Internal Medicine

## 2015-10-18 NOTE — Telephone Encounter (Signed)
Last filled

## 2015-10-18 NOTE — Telephone Encounter (Signed)
Last filled 09-21-15 #60 Last OV Acute 10-06-15 Next OV 05-29-16

## 2015-10-18 NOTE — Telephone Encounter (Signed)
Approved: okay to refill for a year 

## 2015-10-18 NOTE — Telephone Encounter (Signed)
Rx sent electronically.  

## 2015-11-09 ENCOUNTER — Encounter: Payer: Self-pay | Admitting: Internal Medicine

## 2015-12-19 ENCOUNTER — Telehealth: Payer: Self-pay | Admitting: Internal Medicine

## 2015-12-19 NOTE — Telephone Encounter (Signed)
Will follow up on the ER evaluation

## 2015-12-19 NOTE — Telephone Encounter (Signed)
[facility]  Patient Name: Michael Kane  DOB: December 21, 1928    Initial Comment dull pain in heart area. no chest pain, no trouble breathing, no heart palpatations   Nurse Assessment  Nurse: Harlow Mares, RN, Suanne Marker Date/Time Eilene Ghazi Time): 12/19/2015 4:08:07 PM  Confirm and document reason for call. If symptomatic, describe symptoms. You must click the next button to save text entered. ---dull pain in heart area. no chest pain, no trouble breathing, no heart palpitations. Reports that pain began within the last week. Describes the pain as a pressure feeling in the chest, dull pain.  Has the patient traveled out of the country within the last 30 days? ---No  Does the patient have any new or worsening symptoms? ---Yes  Will a triage be completed? ---Yes  Related visit to physician within the last 2 weeks? ---No  Does the PT have any chronic conditions? (i.e. diabetes, asthma, etc.) ---No  Is this a behavioral health or substance abuse call? ---No     Guidelines    Guideline Title Affirmed Question Affirmed Notes  Chest Pain Dizziness or lightheadedness    Final Disposition User   Go to ED Now Harlow Mares, RN, Suanne Marker    Referrals  GO TO FACILITY UNDECIDED   Disagree/Comply: Leta Baptist

## 2016-04-08 ENCOUNTER — Other Ambulatory Visit: Payer: Self-pay | Admitting: Internal Medicine

## 2016-05-29 ENCOUNTER — Encounter: Payer: Self-pay | Admitting: Internal Medicine

## 2016-05-29 ENCOUNTER — Ambulatory Visit (INDEPENDENT_AMBULATORY_CARE_PROVIDER_SITE_OTHER): Payer: Medicare Other | Admitting: Internal Medicine

## 2016-05-29 VITALS — BP 136/66 | HR 60 | Temp 98.0°F | Ht 66.75 in | Wt 169.0 lb

## 2016-05-29 DIAGNOSIS — F39 Unspecified mood [affective] disorder: Secondary | ICD-10-CM | POA: Diagnosis not present

## 2016-05-29 DIAGNOSIS — Z0000 Encounter for general adult medical examination without abnormal findings: Secondary | ICD-10-CM

## 2016-05-29 DIAGNOSIS — G3184 Mild cognitive impairment, so stated: Secondary | ICD-10-CM

## 2016-05-29 DIAGNOSIS — Z7189 Other specified counseling: Secondary | ICD-10-CM | POA: Diagnosis not present

## 2016-05-29 DIAGNOSIS — G479 Sleep disorder, unspecified: Secondary | ICD-10-CM | POA: Diagnosis not present

## 2016-05-29 DIAGNOSIS — I10 Essential (primary) hypertension: Secondary | ICD-10-CM

## 2016-05-29 DIAGNOSIS — I350 Nonrheumatic aortic (valve) stenosis: Secondary | ICD-10-CM | POA: Diagnosis not present

## 2016-05-29 LAB — CBC WITH DIFFERENTIAL/PLATELET
Basophils Absolute: 0 10*3/uL (ref 0.0–0.1)
Basophils Relative: 0.5 % (ref 0.0–3.0)
EOS PCT: 2.9 % (ref 0.0–5.0)
Eosinophils Absolute: 0.2 10*3/uL (ref 0.0–0.7)
HEMATOCRIT: 43.3 % (ref 39.0–52.0)
HEMOGLOBIN: 14.7 g/dL (ref 13.0–17.0)
LYMPHS PCT: 27.7 % (ref 12.0–46.0)
Lymphs Abs: 1.7 10*3/uL (ref 0.7–4.0)
MCHC: 34 g/dL (ref 30.0–36.0)
MCV: 95.4 fl (ref 78.0–100.0)
MONOS PCT: 12.7 % — AB (ref 3.0–12.0)
Monocytes Absolute: 0.8 10*3/uL (ref 0.1–1.0)
Neutro Abs: 3.3 10*3/uL (ref 1.4–7.7)
Neutrophils Relative %: 56.2 % (ref 43.0–77.0)
Platelets: 222 10*3/uL (ref 150.0–400.0)
RBC: 4.54 Mil/uL (ref 4.22–5.81)
RDW: 14.2 % (ref 11.5–15.5)
WBC: 6 10*3/uL (ref 4.0–10.5)

## 2016-05-29 LAB — COMPREHENSIVE METABOLIC PANEL
ALBUMIN: 4 g/dL (ref 3.5–5.2)
ALK PHOS: 79 U/L (ref 39–117)
ALT: 14 U/L (ref 0–53)
AST: 20 U/L (ref 0–37)
BUN: 20 mg/dL (ref 6–23)
CO2: 30 mEq/L (ref 19–32)
Calcium: 9.1 mg/dL (ref 8.4–10.5)
Chloride: 105 mEq/L (ref 96–112)
Creatinine, Ser: 1.06 mg/dL (ref 0.40–1.50)
GFR: 70.54 mL/min (ref >60.00)
Glucose, Bld: 91 mg/dL (ref 70–99)
POTASSIUM: 4.1 meq/L (ref 3.5–5.1)
SODIUM: 140 meq/L (ref 135–145)
TOTAL PROTEIN: 6.5 g/dL (ref 6.0–8.3)
Total Bilirubin: 0.5 mg/dL (ref 0.2–1.2)

## 2016-05-29 LAB — T4, FREE: FREE T4: 0.88 ng/dL (ref 0.60–1.60)

## 2016-05-29 NOTE — Progress Notes (Signed)
Pre visit review using our clinic review tool, if applicable. No additional management support is needed unless otherwise documented below in the visit note. 

## 2016-05-29 NOTE — Assessment & Plan Note (Signed)
BP Readings from Last 3 Encounters:  05/29/16 136/66  10/06/15 (!) 150/74  07/19/15 140/70   Good control No change needed

## 2016-05-29 NOTE — Progress Notes (Signed)
Subjective:    Patient ID: Michael Kane, male    DOB: 09/14/1928, 83 y.o.   MRN: [id number]  HPI Here with daughter for Medicare wellness and follow up of chronic health conditions Reviewed form and advanced directives Reviewed other doctors No tobacco now Occasional alcohol drink Tries to exercise regularly Has 2 indoor cats No regular depression or anhedonia No falls Vision is okay Does okay with hearing aides  Still driving Not cooking--gets food at [facility] Does all instrumental ADLs but daughter does all his finances Has monthly deep cleaning  Memory has gotten some worse-- gets irritative. Daughter notes only mild decline He does better if he writes things down  Daughter has noticed some instability in walking Not bad No falls--but will stumble Walks the dog daily--but she is 31 years old--and doesn't go that far No incontinence in general (rare fecal if he eats certain food)  No chest pain No dizziness or syncope No SOB No edema No palpitations  Mild tremor in hands Some trouble writing No problems eating, drinking, or other ADLs  Current Outpatient Prescriptions on File Prior to Visit  Medication Sig Dispense Refill  . amLODipine (NORVASC) 10 MG tablet TAKE 1 TABLET BY MOUTH DAILY 90 tablet 2  . aspirin 81 MG tablet Take 81 mg by mouth daily.      Marland Kitchen doxycycline (VIBRA-TABS) 100 MG tablet TAKE 1 TABLET (100 MG TOTAL) BY MOUTH DAILY. 30 tablet 1  . metroNIDAZOLE (METROGEL) 0.75 % gel Apply 1 application topically 2 (two) times daily. 45 g 3  . Multiple Vitamins-Minerals (MENS MULTI VITAMIN & MINERAL PO) Take by mouth daily.      . traZODone (DESYREL) 100 MG tablet TAKE 2 TABLETS AT BEDTIME 60 tablet 11   No current facility-administered medications on file prior to visit.     Allergies  Allergen Reactions  . Lisinopril     REACTION: cough    Past Medical History:  Diagnosis Date  . History of prostate cancer    RT 2006  . Hx of colonic polyps    . Hypertension   . Mild cognitive impairment   . OSA (obstructive sleep apnea)    on CPAP of 10  . Rosacea   . Tremor, essential   . Wolff-Parkinson-White syndrome 1970    Past Surgical History:  Procedure Laterality Date  . TONSILLECTOMY    . TOTAL KNEE ARTHROPLASTY Left 3/14    Family History  Problem Relation Age of Onset  . Stroke Brother     Social History   Social History  . Marital status: Widowed    Spouse name: N/A  . Number of children: 3  . Years of education: N/A   Occupational History  . retired Charity fundraiser Retired   Social History Main Topics  . Smoking status: Former Smoker    Types: Cigarettes    Quit date: 04/23/1958  . Smokeless tobacco: Never Used  . Alcohol use 0.0 oz/week     Comment: 1 beer daily  . Drug use: No  . Sexual activity: Not on file   Other Topics Concern  . Not on file   Social History Narrative   Wrote poetry and  short stories.  Occ memoirs (has been published)      Has living will.    Requested DNR so form completed.    Requests daughter [name] as health care POA.    Would accept feeding tube unless vegetative state  Review of Systems Appetite is good Weight is stable Wears seat belt Keeps up with dentist-- Barnie Alderman. Filling just replaced Bowels are fine. No blood Voids okay. No sig nocturia No heartburn or dysphagia No rash or suspicious skin lesions    Objective:   Physical Exam  Constitutional: He is oriented to person, place, and time. He appears well-nourished. No distress.  HENT:  Mouth/Throat: Oropharynx is clear and moist. No oropharyngeal exudate.  Neck: Normal range of motion. Neck supple. No thyromegaly present.  Cardiovascular: Normal rate, regular rhythm and intact distal pulses.  Exam reveals no gallop.   Gr 3/6 coarse aortic systolic murmur  Pulmonary/Chest: Effort normal and breath sounds normal. No respiratory distress. He has no wheezes. He has no rales.  Abdominal:  Soft. There is no tenderness.  Musculoskeletal: He exhibits no edema or tenderness.  Lymphadenopathy:    He has no cervical adenopathy.  Neurological: He is alert and oriented to person, place, and time. He displays a negative Romberg sign. Coordination and gait normal.  President--- "Trump, ?" [id number] D-l-r-o-w Recall 0/3  Very mild tremor in hands  Skin: No rash noted. No erythema.  Psychiatric: He has a normal mood and affect. His behavior is normal.          Assessment & Plan:

## 2016-05-29 NOTE — Assessment & Plan Note (Signed)
Does well with the trazodone 

## 2016-05-29 NOTE — Assessment & Plan Note (Signed)
Still thinks about wife but no prolonged depressive symptoms Now seeing a woman friend--this has helped

## 2016-05-29 NOTE — Assessment & Plan Note (Signed)
No apparent symptoms 

## 2016-05-29 NOTE — Assessment & Plan Note (Signed)
I have personally reviewed the Medicare Annual Wellness questionnaire and have noted 1. The patient's medical and social history 2. Their use of alcohol, tobacco or illicit drugs 3. Their current medications and supplements 4. The patient's functional ability including ADL's, fall risks, home safety risks and hearing or visual             impairment. 5. Diet and physical activities 6. Evidence for depression or mood disorders  The patients weight, height, BMI and visual acuity have been recorded in the chart I have made referrals, counseling and provided education to the patient based review of the above and I have provided the pt with a written personalized care plan for preventive services.  I have provided you with a copy of your personalized plan for preventive services. Please take the time to review along with your updated medication list.  No cancer screening due to age UTD on imms Discussed exercise program for balance and leg strength

## 2016-05-29 NOTE — Assessment & Plan Note (Signed)
Slight progression at most Still fairly functionally independent Discussed considering AL---but at least look for more social engagement

## 2016-05-29 NOTE — Assessment & Plan Note (Signed)
Has DNR 

## 2016-06-04 ENCOUNTER — Ambulatory Visit: Payer: Medicare Other | Admitting: Family Medicine

## 2016-06-04 ENCOUNTER — Ambulatory Visit (INDEPENDENT_AMBULATORY_CARE_PROVIDER_SITE_OTHER): Payer: Medicare Other | Admitting: Internal Medicine

## 2016-06-04 ENCOUNTER — Encounter: Payer: Self-pay | Admitting: Internal Medicine

## 2016-06-04 VITALS — BP 128/80 | HR 68 | Temp 98.7°F | Wt 163.0 lb

## 2016-06-04 DIAGNOSIS — B9789 Other viral agents as the cause of diseases classified elsewhere: Secondary | ICD-10-CM

## 2016-06-04 DIAGNOSIS — J069 Acute upper respiratory infection, unspecified: Secondary | ICD-10-CM | POA: Diagnosis not present

## 2016-06-04 NOTE — Progress Notes (Signed)
HPI  Pt presents to the clinic today with c/o runny nose and cough. This started 1 week ago. He is blowing clear mucous out of his nose. The cough is non productive. He denies fever, chills or body aches. He has not tried anything OTC that. He has not had sick contacts that he is aware of. He did get his flu shot.  Review of Systems        Past Medical History:  Diagnosis Date  . History of prostate cancer    RT 2006  . Hx of colonic polyps   . Hypertension   . Mild cognitive impairment   . OSA (obstructive sleep apnea)    on CPAP of 10  . Rosacea   . Tremor, essential   . Wolff-Parkinson-White syndrome 1970    Family History  Problem Relation Age of Onset  . Stroke Brother     Social History   Social History  . Marital status: Widowed    Spouse name: N/A  . Number of children: 3  . Years of education: N/A   Occupational History  . retired Charity fundraiser Retired   Social History Main Topics  . Smoking status: Former Smoker    Types: Cigarettes    Quit date: 04/23/1958  . Smokeless tobacco: Never Used  . Alcohol use 0.0 oz/week     Comment: 1 beer daily  . Drug use: No  . Sexual activity: Not on file   Other Topics Concern  . Not on file   Social History Narrative   Wrote poetry and  short stories.  Occ memoirs (has been published)      Has living will.    Requested DNR so form completed.    Requests daughter [name] as health care POA.    Would accept feeding tube unless vegetative state             Allergies  Allergen Reactions  . Lisinopril     REACTION: cough     Constitutional: Denies headache, fatigue, fever or abrupt weight changes.  HEENT:  Positive runny nose. Denies eye redness, eye pain, pressure behind the eyes, facial pain, nasal congestion, ear pain, ringing in the ears, wax buildup or sore throat. Respiratory: Positive cough. Denies difficulty breathing or shortness of breath.  Cardiovascular: Denies chest pain, chest  tightness, palpitations or swelling in the hands or feet.   No other specific complaints in a complete review of systems (except as listed in HPI above).  Objective:  BP 128/80   Pulse 68   Temp 98.7 F (37.1 C) (Oral)   Wt 163 lb (73.9 kg)   SpO2 96%   BMI 25.72 kg/m   Wt Readings from Last 3 Encounters:  06/04/16 163 lb (73.9 kg)  05/29/16 169 lb (76.7 kg)  10/06/15 163 lb (73.9 kg)     General: Appears his stated age, in NAD. HEENT: Head: normal shape and size, no sinus tenderness noted; Nose: mucosa boggy and moist, septum midline; Throat/Mouth: + PND. Teeth present, mucosa pink and moist, no exudate noted, no lesions or ulcerations noted.  Neck: No cervical lymphadenopathy.  Cardiovascular: Normal rate and rhythm. Murmur noted. Pulmonary/Chest: Normal effort and positive vesicular breath sounds. No respiratory distress. No wheezes, rales or ronchi noted.       Assessment & Plan:   Viral Upper Respiratory Infection with Cough:  Get some rest and drink plenty of water Start Zyrtec and Flonase OTC x 1 week Delsym as needed for cough  RTC as needed or if symptoms persist.   Webb Silversmith, NP

## 2016-06-04 NOTE — Patient Instructions (Signed)
Upper Respiratory Infection, Adult Most upper respiratory infections (URIs) are caused by a virus. A URI affects the nose, throat, and upper air passages. The most common type of URI is often called "the common cold." Follow these instructions at home:  Take medicines only as told by your doctor.  Gargle warm saltwater or take cough drops to comfort your throat as told by your doctor.  Use a warm mist humidifier or inhale steam from a shower to increase air moisture. This may make it easier to breathe.  Drink enough fluid to keep your pee (urine) clear or pale yellow.  Eat soups and other clear broths.  Have a healthy diet.  Rest as needed.  Go back to work when your fever is gone or your doctor says it is okay.  You may need to stay home longer to avoid giving your URI to others.  You can also wear a face mask and wash your hands often to prevent spread of the virus.  Use your inhaler more if you have asthma.  Do not use any tobacco products, including cigarettes, chewing tobacco, or electronic cigarettes. If you need help quitting, ask your doctor. Contact a doctor if:  You are getting worse, not better.  Your symptoms are not helped by medicine.  You have chills.  You are getting more short of breath.  You have brown or red mucus.  You have yellow or brown discharge from your nose.  You have pain in your face, especially when you bend forward.  You have a fever.  You have puffy (swollen) neck glands.  You have pain while swallowing.  You have white areas in the back of your throat. Get help right away if:  You have very bad or constant:  Headache.  Ear pain.  Pain in your forehead, behind your eyes, and over your cheekbones (sinus pain).  Chest pain.  You have long-lasting (chronic) lung disease and any of the following:  Wheezing.  Long-lasting cough.  Coughing up blood.  A change in your usual mucus.  You have a stiff neck.  You have  changes in your:  Vision.  Hearing.  Thinking.  Mood. This information is not intended to replace advice given to you by your health care provider. Make sure you discuss any questions you have with your health care provider. Document Released: 09/26/2007 Document Revised: 12/11/2015 Document Reviewed: 07/15/2013 Elsevier Interactive Patient Education  2017 [employer] Inc.  

## 2016-06-19 DIAGNOSIS — R278 Other lack of coordination: Secondary | ICD-10-CM | POA: Diagnosis not present

## 2016-06-20 DIAGNOSIS — R278 Other lack of coordination: Secondary | ICD-10-CM | POA: Diagnosis not present

## 2016-06-22 DIAGNOSIS — R4189 Other symptoms and signs involving cognitive functions and awareness: Secondary | ICD-10-CM | POA: Diagnosis not present

## 2016-06-22 DIAGNOSIS — R278 Other lack of coordination: Secondary | ICD-10-CM | POA: Diagnosis not present

## 2016-06-22 DIAGNOSIS — R41840 Attention and concentration deficit: Secondary | ICD-10-CM | POA: Diagnosis not present

## 2016-06-25 DIAGNOSIS — R4189 Other symptoms and signs involving cognitive functions and awareness: Secondary | ICD-10-CM | POA: Diagnosis not present

## 2016-06-25 DIAGNOSIS — R41840 Attention and concentration deficit: Secondary | ICD-10-CM | POA: Diagnosis not present

## 2016-06-25 DIAGNOSIS — R278 Other lack of coordination: Secondary | ICD-10-CM | POA: Diagnosis not present

## 2016-06-26 DIAGNOSIS — R278 Other lack of coordination: Secondary | ICD-10-CM | POA: Diagnosis not present

## 2016-06-26 DIAGNOSIS — R4189 Other symptoms and signs involving cognitive functions and awareness: Secondary | ICD-10-CM | POA: Diagnosis not present

## 2016-06-26 DIAGNOSIS — R41840 Attention and concentration deficit: Secondary | ICD-10-CM | POA: Diagnosis not present

## 2016-06-29 DIAGNOSIS — R278 Other lack of coordination: Secondary | ICD-10-CM | POA: Diagnosis not present

## 2016-06-29 DIAGNOSIS — R41840 Attention and concentration deficit: Secondary | ICD-10-CM | POA: Diagnosis not present

## 2016-06-29 DIAGNOSIS — R4189 Other symptoms and signs involving cognitive functions and awareness: Secondary | ICD-10-CM | POA: Diagnosis not present

## 2016-07-01 ENCOUNTER — Other Ambulatory Visit: Payer: Self-pay | Admitting: Internal Medicine

## 2016-07-02 DIAGNOSIS — R4189 Other symptoms and signs involving cognitive functions and awareness: Secondary | ICD-10-CM | POA: Diagnosis not present

## 2016-07-02 DIAGNOSIS — R41840 Attention and concentration deficit: Secondary | ICD-10-CM | POA: Diagnosis not present

## 2016-07-02 DIAGNOSIS — R278 Other lack of coordination: Secondary | ICD-10-CM | POA: Diagnosis not present

## 2016-07-02 NOTE — Telephone Encounter (Signed)
Approved: okay for 1 year---uses for rosacea

## 2016-07-03 DIAGNOSIS — R4189 Other symptoms and signs involving cognitive functions and awareness: Secondary | ICD-10-CM | POA: Diagnosis not present

## 2016-07-03 DIAGNOSIS — R278 Other lack of coordination: Secondary | ICD-10-CM | POA: Diagnosis not present

## 2016-07-03 DIAGNOSIS — R41840 Attention and concentration deficit: Secondary | ICD-10-CM | POA: Diagnosis not present

## 2016-07-06 DIAGNOSIS — R4189 Other symptoms and signs involving cognitive functions and awareness: Secondary | ICD-10-CM | POA: Diagnosis not present

## 2016-07-06 DIAGNOSIS — R41840 Attention and concentration deficit: Secondary | ICD-10-CM | POA: Diagnosis not present

## 2016-07-06 DIAGNOSIS — R278 Other lack of coordination: Secondary | ICD-10-CM | POA: Diagnosis not present

## 2016-07-09 DIAGNOSIS — R41840 Attention and concentration deficit: Secondary | ICD-10-CM | POA: Diagnosis not present

## 2016-07-09 DIAGNOSIS — R4189 Other symptoms and signs involving cognitive functions and awareness: Secondary | ICD-10-CM | POA: Diagnosis not present

## 2016-07-09 DIAGNOSIS — R278 Other lack of coordination: Secondary | ICD-10-CM | POA: Diagnosis not present

## 2016-07-11 DIAGNOSIS — R41840 Attention and concentration deficit: Secondary | ICD-10-CM | POA: Diagnosis not present

## 2016-07-11 DIAGNOSIS — R4189 Other symptoms and signs involving cognitive functions and awareness: Secondary | ICD-10-CM | POA: Diagnosis not present

## 2016-07-11 DIAGNOSIS — R278 Other lack of coordination: Secondary | ICD-10-CM | POA: Diagnosis not present

## 2016-07-13 DIAGNOSIS — R4189 Other symptoms and signs involving cognitive functions and awareness: Secondary | ICD-10-CM | POA: Diagnosis not present

## 2016-07-13 DIAGNOSIS — R41840 Attention and concentration deficit: Secondary | ICD-10-CM | POA: Diagnosis not present

## 2016-07-13 DIAGNOSIS — R278 Other lack of coordination: Secondary | ICD-10-CM | POA: Diagnosis not present

## 2016-07-16 DIAGNOSIS — R278 Other lack of coordination: Secondary | ICD-10-CM | POA: Diagnosis not present

## 2016-07-16 DIAGNOSIS — R4189 Other symptoms and signs involving cognitive functions and awareness: Secondary | ICD-10-CM | POA: Diagnosis not present

## 2016-07-16 DIAGNOSIS — R41840 Attention and concentration deficit: Secondary | ICD-10-CM | POA: Diagnosis not present

## 2016-07-18 DIAGNOSIS — R278 Other lack of coordination: Secondary | ICD-10-CM | POA: Diagnosis not present

## 2016-07-18 DIAGNOSIS — R41840 Attention and concentration deficit: Secondary | ICD-10-CM | POA: Diagnosis not present

## 2016-07-18 DIAGNOSIS — R4189 Other symptoms and signs involving cognitive functions and awareness: Secondary | ICD-10-CM | POA: Diagnosis not present

## 2016-07-23 DIAGNOSIS — R4189 Other symptoms and signs involving cognitive functions and awareness: Secondary | ICD-10-CM | POA: Diagnosis not present

## 2016-07-25 DIAGNOSIS — R4189 Other symptoms and signs involving cognitive functions and awareness: Secondary | ICD-10-CM | POA: Diagnosis not present

## 2016-07-27 DIAGNOSIS — R4189 Other symptoms and signs involving cognitive functions and awareness: Secondary | ICD-10-CM | POA: Diagnosis not present

## 2016-07-30 DIAGNOSIS — R4189 Other symptoms and signs involving cognitive functions and awareness: Secondary | ICD-10-CM | POA: Diagnosis not present

## 2016-08-01 DIAGNOSIS — R4189 Other symptoms and signs involving cognitive functions and awareness: Secondary | ICD-10-CM | POA: Diagnosis not present

## 2016-08-08 DIAGNOSIS — R4189 Other symptoms and signs involving cognitive functions and awareness: Secondary | ICD-10-CM | POA: Diagnosis not present

## 2016-08-17 DIAGNOSIS — H6121 Impacted cerumen, right ear: Secondary | ICD-10-CM | POA: Diagnosis not present

## 2016-08-17 DIAGNOSIS — H60331 Swimmer's ear, right ear: Secondary | ICD-10-CM | POA: Diagnosis not present

## 2016-08-21 ENCOUNTER — Encounter: Payer: Self-pay | Admitting: Internal Medicine

## 2016-08-21 ENCOUNTER — Ambulatory Visit (INDEPENDENT_AMBULATORY_CARE_PROVIDER_SITE_OTHER): Payer: Medicare Other | Admitting: Internal Medicine

## 2016-08-21 VITALS — BP 148/88 | HR 54 | Temp 97.7°F | Wt 167.2 lb

## 2016-08-21 DIAGNOSIS — F0150 Vascular dementia without behavioral disturbance: Secondary | ICD-10-CM | POA: Diagnosis not present

## 2016-08-21 NOTE — Progress Notes (Signed)
   Subjective:    Patient ID: Michael Kane, male    DOB: January 05, 1929, 83 y.o.   MRN: [id number]  HPI Here for follow up of dementia See multiple recent emails from daughter and [facility] staff 2 daughters are here  They have recently taken away his car He was very upset about this--but now is feeling okay about it Does shop for breakfast-- now starting with aide to drive him Gets lunch and supper from the [place] do have financial power of attorney (mostly daughter [name]) He has been lax about showing financial statements around to others Discussed concern for fraud Children concerned about lapses in judgement at times  Considering assisted living Discussed that this would be a good idea for many reasons Is on the waiting list there  Current Outpatient Prescriptions on File Prior to Visit  Medication Sig Dispense Refill  . amLODipine (NORVASC) 10 MG tablet TAKE 1 TABLET BY MOUTH DAILY 90 tablet 2  . aspirin 81 MG tablet Take 81 mg by mouth daily.      . metroNIDAZOLE (METROGEL) 0.75 % gel Apply 1 application topically 2 (two) times daily. 45 g 3  . Multiple Vitamins-Minerals (MENS MULTI VITAMIN & MINERAL PO) Take by mouth daily.      . traZODone (DESYREL) 100 MG tablet TAKE 2 TABLETS AT BEDTIME 60 tablet 11   No current facility-administered medications on file prior to visit.     Allergies  Allergen Reactions  . Lisinopril     REACTION: cough    Past Medical History:  Diagnosis Date  . History of prostate cancer    RT 2006  . Hx of colonic polyps   . Hypertension   . Mild cognitive impairment   . OSA (obstructive sleep apnea)    on CPAP of 10  . Rosacea   . Tremor, essential   . Wolff-Parkinson-White syndrome 1970    Past Surgical History:  Procedure Laterality Date  . TONSILLECTOMY    . TOTAL KNEE ARTHROPLASTY Left 3/14    Family History  Problem Relation Age of Onset  . Stroke Brother     Social History    Social History  . Marital status: Widowed    Spouse name: N/A  . Number of children: 3  . Years of education: N/A   Occupational History  . retired Charity fundraiser Retired   Social History Main Topics  . Smoking status: Former Smoker    Types: Cigarettes    Quit date: 04/23/1958  . Smokeless tobacco: Never Used  . Alcohol use 0.0 oz/week     Comment: 1 beer daily  . Drug use: No  . Sexual activity: Not on file   Other Topics Concern  . Not on file   Social History Narrative   Wrote poetry and  short stories.  Occ memoirs (has been published)      Has living will.    Requested DNR so form completed.    Requests daughter [name] as health care POA.    Would accept feeding tube unless vegetative state            Review of Systems  Sleeping okay Appetite is good     Objective:   Physical Exam  Constitutional: He appears well-nourished. No distress.  Neurological: He is alert.          Assessment & Plan:

## 2016-08-21 NOTE — Assessment & Plan Note (Signed)
Has had some functional decline Now has car taken away Looking into assisted living Needs financial controls-- lacks judgement and is at risk of fraud Will write letter

## 2016-09-26 ENCOUNTER — Other Ambulatory Visit: Payer: Self-pay | Admitting: Internal Medicine

## 2016-10-03 ENCOUNTER — Other Ambulatory Visit: Payer: Self-pay | Admitting: Internal Medicine

## 2017-02-19 ENCOUNTER — Telehealth: Payer: Self-pay | Admitting: *Deleted

## 2017-02-19 NOTE — Telephone Encounter (Signed)
Copied from [place] #[id number]. Topic: Inquiry >> Feb 19, 2017  3:01 PM [name] wrote: Reason for CRM: PT called to let Dr. Silvio Pate know that he doesn't have any mode of transport b/c he is at the [facility] where Dr. Silvio Pate visits. He is concerned b/c he knows that he cannot make his visit next year and wanted Dr. Silvio Pate to visit him when he comes again to [facility]

## 2017-02-20 NOTE — Telephone Encounter (Signed)
I think he is somewhat confused. He is not in the health care or Memory Care--so I believe he is still in his [place]. He can either get [facility] transportation to bring him or better yet a family member (since I would want them to be with him to report on how he is doing). Please call him but you may need to call his family as well

## 2017-02-20 NOTE — Telephone Encounter (Signed)
Spoke to pt. He still lives in the [place]. I advised him he did not have an appt until 05/2017. That would give him plenty of time to set up transportation with [facility] or have a family member bring him. He said he wrote the information down.

## 2017-05-28 DIAGNOSIS — R4189 Other symptoms and signs involving cognitive functions and awareness: Secondary | ICD-10-CM | POA: Diagnosis not present

## 2017-05-29 DIAGNOSIS — R4189 Other symptoms and signs involving cognitive functions and awareness: Secondary | ICD-10-CM | POA: Diagnosis not present

## 2017-05-30 ENCOUNTER — Other Ambulatory Visit: Payer: Self-pay | Admitting: Internal Medicine

## 2017-05-31 ENCOUNTER — Encounter: Payer: Medicare Other | Admitting: Internal Medicine

## 2017-06-03 DIAGNOSIS — R4189 Other symptoms and signs involving cognitive functions and awareness: Secondary | ICD-10-CM | POA: Diagnosis not present

## 2017-06-05 ENCOUNTER — Other Ambulatory Visit: Payer: Self-pay | Admitting: Internal Medicine

## 2017-06-05 DIAGNOSIS — R4189 Other symptoms and signs involving cognitive functions and awareness: Secondary | ICD-10-CM | POA: Diagnosis not present

## 2017-06-07 ENCOUNTER — Other Ambulatory Visit: Payer: Self-pay | Admitting: Internal Medicine

## 2017-06-07 DIAGNOSIS — R4189 Other symptoms and signs involving cognitive functions and awareness: Secondary | ICD-10-CM | POA: Diagnosis not present

## 2017-06-08 ENCOUNTER — Other Ambulatory Visit: Payer: Self-pay | Admitting: Internal Medicine

## 2017-06-10 ENCOUNTER — Other Ambulatory Visit: Payer: Self-pay | Admitting: Internal Medicine

## 2017-06-10 ENCOUNTER — Telehealth: Payer: Self-pay | Admitting: Internal Medicine

## 2017-06-10 DIAGNOSIS — R4189 Other symptoms and signs involving cognitive functions and awareness: Secondary | ICD-10-CM | POA: Diagnosis not present

## 2017-06-10 MED ORDER — TRAZODONE HCL 100 MG PO TABS: 200.0000 mg | ORAL_TABLET | Freq: Every day | ORAL | 1 refills | Status: DC

## 2017-06-10 NOTE — Telephone Encounter (Signed)
[facility] Will forward to [facility] to see if she will see him as a pt.

## 2017-06-10 NOTE — Telephone Encounter (Signed)
Copied from [place]. Topic: Quick Communication - Rx Refill/Question >> Jun 10, 2017  2:37 PM Boyd Kerbs wrote:  Medication:  TraZODone HCl 100 MG   Daughter called - her dad is upset with Dr. Silvio Pate (because of not coming to his house) (hard to reason with him and she knows it is not Dr. Alla German fault) and said he wanted to see Webb Silversmith as his PCP.Marland Kitchen   He is out of medication and his daughter is not sure what to do.Marland Kitchen He ability to make decisions is limited.  She is asking if he can change PCP to [facility] but would put in prescription to help him until they can get appt and come in.   Please call daughter back   Has the patient contacted their pharmacy? No.   (Agent: If no, request that the patient contact the pharmacy for the refill.)   Preferred Pharmacy (with phone number or street name):   [id number] [address] [place] [address] Phone: [phone] Fax: [phone]     Agent: Please be advised that RX refills may take up to 3 business days. We ask that you follow-up with your pharmacy.

## 2017-06-10 NOTE — Telephone Encounter (Signed)
Pt last saw Dr Silvio Pate 08/21/16. Please see phone note dated 02/19/17 and refill request 06/10/17.Please advise. Trazodone last refilled # 60 x 7 on 10/03/16.[facility].

## 2017-06-10 NOTE — Telephone Encounter (Signed)
I will see him but let him know that Dr. Silvio Pate is my supervising physician and I work closely with him

## 2017-06-10 NOTE — Telephone Encounter (Signed)
Okay to refill trazodone for 2 months and see if Roll[facility] Fare will take over his care

## 2017-06-11 NOTE — Telephone Encounter (Signed)
Appointment 3/18  Daughter aware

## 2017-06-13 DIAGNOSIS — R4189 Other symptoms and signs involving cognitive functions and awareness: Secondary | ICD-10-CM | POA: Diagnosis not present

## 2017-06-14 DIAGNOSIS — R4189 Other symptoms and signs involving cognitive functions and awareness: Secondary | ICD-10-CM | POA: Diagnosis not present

## 2017-07-05 ENCOUNTER — Other Ambulatory Visit: Payer: Self-pay | Admitting: Internal Medicine

## 2017-07-08 ENCOUNTER — Ambulatory Visit: Payer: Medicare Other | Admitting: Internal Medicine

## 2017-07-30 ENCOUNTER — Telehealth: Payer: Self-pay | Admitting: Internal Medicine

## 2017-07-30 ENCOUNTER — Other Ambulatory Visit: Payer: Self-pay | Admitting: Internal Medicine

## 2017-07-30 ENCOUNTER — Encounter: Payer: Self-pay | Admitting: Internal Medicine

## 2017-07-30 NOTE — Telephone Encounter (Unsigned)
Copied from [place] [id number]. Topic: Appointment Scheduling - Scheduling Inquiry for Clinic >> Jul 30, 2017  8:31 AM Hewitt Shorts wrote: CRM for notification. See Telephone encounter for: 07/30/17.pt daughter is calling to comfirm pt appt for 08/01/17 at 1045 as a new patient and it looks like it was cancelled by accident and  we are really needing to get the appt scheduled back on same day but it is blocked because another new patient has already been scheduled earlier can this been over ride  Best number [phone] ms Tamala Julian -pt daughter POA and she lives in [place] and has had to change things already

## 2017-07-30 NOTE — Telephone Encounter (Signed)
Approved: okay #[phone] x 1

## 2017-07-30 NOTE — Telephone Encounter (Signed)
Last Rx 06/10/2017 #60 1R. Last OV 08/2016. Pt to establish care with [facility] 4/11

## 2017-08-01 ENCOUNTER — Ambulatory Visit: Payer: Medicare Other | Admitting: Internal Medicine

## 2017-08-01 ENCOUNTER — Encounter: Payer: Self-pay | Admitting: Internal Medicine

## 2017-08-01 ENCOUNTER — Ambulatory Visit (INDEPENDENT_AMBULATORY_CARE_PROVIDER_SITE_OTHER): Payer: Medicare Other | Admitting: Internal Medicine

## 2017-08-01 DIAGNOSIS — G4733 Obstructive sleep apnea (adult) (pediatric): Secondary | ICD-10-CM | POA: Diagnosis not present

## 2017-08-01 DIAGNOSIS — G250 Essential tremor: Secondary | ICD-10-CM

## 2017-08-01 DIAGNOSIS — I456 Pre-excitation syndrome: Secondary | ICD-10-CM

## 2017-08-01 DIAGNOSIS — F0150 Vascular dementia without behavioral disturbance: Secondary | ICD-10-CM | POA: Diagnosis not present

## 2017-08-01 DIAGNOSIS — I10 Essential (primary) hypertension: Secondary | ICD-10-CM | POA: Diagnosis not present

## 2017-08-01 DIAGNOSIS — Z8546 Personal history of malignant neoplasm of prostate: Secondary | ICD-10-CM

## 2017-08-01 NOTE — Progress Notes (Signed)
HPI  Pt presents to the clinic today to establish care and for management of the conditions listed below. He is transferring care from Dr. Silvio Pate.   Histy of Prostate Cancer: s/p radiation. He does not follow with urology  OSA: He no longer wears a CPAP. He feels like he is sleeping well with the Trazadone. He does not follow with pulmonology.  Essential Tremor: Stable off meds.  WPW: Controlled off meds. He does not follow with cardiology.  Vascular Dementia: Mild cognitive issues. Stable functional issues. He is not taking anything OTC for his symptoms.   HTN:  His BP today is 138/74. He is taking Amlodipine as prescribed. ECG from 09/2014 reviewed.   Flu; 01/2016 Tetanus: > 10 years ago Pneumovax: 04/2005 Prevnar: 01/2015 Zostovax: 05/2013 PSA Screening: 2014 Colon Screening: 08/2007 Vision Screening: annually Dentist: biannually   Past Medical History:  Diagnosis Date  . History of prostate cancer    RT 2006  . Hx of colonic polyps   . Hypertension   . Mild cognitive impairment   . OSA (obstructive sleep apnea)    on CPAP of 10  . Rosacea   . Tremor, essential   . Wolff-Parkinson-White syndrome 1970    Current Outpatient Medications  Medication Sig Dispense Refill  . amLODipine (NORVASC) 10 MG tablet TAKE 1 TABLET BY MOUTH EVERY DAY 90 tablet 3  . Multiple Vitamins-Minerals (MENS MULTI VITAMIN & MINERAL PO) Take by mouth daily.      . traZODone (DESYREL) 100 MG tablet TAKE 2 TABLETS (200 MG TOTAL) BY MOUTH AT BEDTIME. 60 tablet 1   No current facility-administered medications for this visit.     Allergies  Allergen Reactions  . Lisinopril     REACTION: cough    Family History  Problem Relation Age of Onset  . Stroke Brother     Social History   Socioeconomic History  . Marital status: Widowed    Spouse name: Not on file  . Number of children: 3  . Years of education: Not on file  . Highest education level: Not on file  Occupational History  .  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 Financial resource strain: Not on file  . Food insecurity:    Worry: Not on file    Inability: Not on file  . Transportation needs:    Medical: Not on file    Non-medical: Not on file  Tobacco Use  . Smoking status: Former Smoker    Types: Cigarettes    Last attempt to quit: 04/23/1958    Years since quitting: 59.3  . Smokeless tobacco: Never Used  Substance and Sexual Activity  . Alcohol use: Yes    Alcohol/week: 0.0 oz    Comment: 1 beer daily  . Drug use: No  . Sexual activity: Not on file  Lifestyle  . Physical activity:    Days per week: Not on file    Minutes per session: Not on file  . Stress: Not on file  Relationships  . Social connections:    Talks on phone: Not on file    Gets together: Not on file    Attends religious service: Not on file    Active member of club or organization: Not on file    Attends meetings of clubs or organizations: Not on file    Relationship status: Not on file  . Intimate partner violence:    Fear of current or ex partner:  Not on file    Emotionally abused: Not on file    Physically abused: Not on file    Forced sexual activity: Not on file  Other Topics Concern  . Not on file  Social History Narrative   Wrote poetry and  short stories.  Occ memoirs (has been published)      Has living will.    Requested DNR so form completed.    Requests daughter [name] as health care POA.    Would accept feeding tube unless vegetative state          ROS:  Constitutional: Denies fever, malaise, fatigue, headache or abrupt weight changes.  HEENT: Denies eye pain, eye redness, ear pain, ringing in the ears, wax buildup, runny nose, nasal congestion, bloody nose, or sore throat. Respiratory: Denies difficulty breathing, shortness of breath, cough or sputum production.   Cardiovascular: Denies chest pain, chest tightness, palpitations or swelling in the hands or  feet.  Gastrointestinal: Denies abdominal pain, bloating, constipation, diarrhea or blood in the stool.  GU: Denies frequency, urgency, pain with urination, blood in urine, odor or discharge. Musculoskeletal: Denies decrease in range of motion, difficulty with gait, muscle pain or joint pain and swelling.  Skin: Denies redness, rashes, lesions or ulcercations.  Neurological: Pt reports difficulty with memory and tremor. Denies dizziness, difficulty with speech or problems with balance and coordination.  Psych: Denies anxiety, depression, SI/HI.  No other specific complaints in a complete review of systems (except as listed in HPI above).  PE:  BP 138/74   Pulse 71   Temp 97.6 F (36.4 C) (Oral)   Wt 171 lb (77.6 kg)   SpO2 98%   BMI 26.98 kg/m  Wt Readings from Last 3 Encounters:  08/01/17 171 lb (77.6 kg)  08/21/16 167 lb 4 oz (75.9 kg)  06/04/16 163 lb (73.9 kg)    General: Appears his stated age, well developed, well nourished in NAD. Cardiovascular: Normal rate and rhythm. S1,S2 noted.  No murmur, rubs or gallops noted. No JVD or BLE edema. No carotid bruits noted. Pulmonary/Chest: Normal effort and positive vesicular breath sounds. No respiratory distress. No wheezes, rales or ronchi noted.  Musculoskeletal: . No difficulty with gait.  Neurological: Alert and oriented. C Psychiatric: Mood and affect normal. Behavior is normal. Judgment and thought content normal.     BMET    Component Value Date/Time   NA 140 05/29/2016 1100   NA 132 (L) 07/11/2012 0425   K 4.1 05/29/2016 1100   K 3.9 07/11/2012 0425   CL 105 05/29/2016 1100   CL 99 07/11/2012 0425   CO2 30 05/29/2016 1100   CO2 25 07/11/2012 0425   GLUCOSE 91 05/29/2016 1100   GLUCOSE 100 (H) 07/11/2012 0425   BUN 20 05/29/2016 1100   BUN 16 07/11/2012 0425   CREATININE 1.06 05/29/2016 1100   CREATININE 0.86 07/11/2012 0425   CALCIUM 9.1 05/29/2016 1100   CALCIUM 8.3 (L) 07/11/2012 0425   GFRNONAA >60  07/11/2012 0425   GFRAA >60 07/11/2012 0425    Lipid Panel  No results found for: CHOL, TRIG, HDL, CHOLHDL, VLDL, LDLCALC  CBC    Component Value Date/Time   WBC 6.0 05/29/2016 1100   RBC 4.54 05/29/2016 1100   HGB 14.7 05/29/2016 1100   HGB 12.3 (L) 07/11/2012 0425   HCT 43.3 05/29/2016 1100   HCT 40.6 06/16/2012 0910   PLT 222.0 05/29/2016 1100   PLT 152 07/11/2012 0425   MCV 95.4 05/29/2016  1100   MCV 96 06/16/2012 0910   MCH 33.2 06/16/2012 0910   MCHC 34.0 05/29/2016 1100   RDW 14.2 05/29/2016 1100   RDW 13.6 06/16/2012 0910   LYMPHSABS 1.7 05/29/2016 1100   MONOABS 0.8 05/29/2016 1100   EOSABS 0.2 05/29/2016 1100   BASOSABS 0.0 05/29/2016 1100    Hgb A1C No results found for: HGBA1C   Assessment and Plan:

## 2017-08-10 DIAGNOSIS — Z8546 Personal history of malignant neoplasm of prostate: Secondary | ICD-10-CM | POA: Insufficient documentation

## 2017-08-10 NOTE — Assessment & Plan Note (Signed)
Controlled off meds  Will monitor 

## 2017-08-10 NOTE — Assessment & Plan Note (Signed)
Stable functional status Will monitor

## 2017-08-10 NOTE — Patient Instructions (Signed)
DASH Eating Plan DASH stands for "Dietary Approaches to Stop Hypertension." The DASH eating plan is a healthy eating plan that has been shown to reduce high blood pressure (hypertension). It may also reduce your risk for type 2 diabetes, heart disease, and stroke. The DASH eating plan may also help with weight loss. What are tips for following this plan? General guidelines  Avoid eating more than 2,300 mg (milligrams) of salt (sodium) a day. If you have hypertension, you may need to reduce your sodium intake to 1,500 mg a day.  Limit alcohol intake to no more than 1 drink a day for nonpregnant women and 2 drinks a day for men. One drink equals 12 oz of beer, 5 oz of wine, or 1 oz of hard liquor.  Work with your health care provider to maintain a healthy body weight or to lose weight. Ask what an ideal weight is for you.  Get at least 30 minutes of exercise that causes your heart to beat faster (aerobic exercise) most days of the week. Activities may include walking, swimming, or biking.  Work with your health care provider or diet and nutrition specialist (dietitian) to adjust your eating plan to your individual calorie needs. Reading food labels  Check food labels for the amount of sodium per serving. Choose foods with less than 5 percent of the Daily Value of sodium. Generally, foods with less than 300 mg of sodium per serving fit into this eating plan.  To find whole grains, look for the word "whole" as the first word in the ingredient list. Shopping  Buy products labeled as "low-sodium" or "no salt added."  Buy fresh foods. Avoid canned foods and premade or frozen meals. Cooking  Avoid adding salt when cooking. Use salt-free seasonings or herbs instead of table salt or sea salt. Check with your health care provider or pharmacist before using salt substitutes.  Do not fry foods. Cook foods using healthy methods such as baking, boiling, grilling, and broiling instead.  Cook with  heart-healthy oils, such as olive, canola, soybean, or sunflower oil. Meal planning   Eat a balanced diet that includes: ? 5 or more servings of fruits and vegetables each day. At each meal, try to fill half of your plate with fruits and vegetables. ? Up to 6-8 servings of whole grains each day. ? Less than 6 oz of lean meat, poultry, or fish each day. A 3-oz serving of meat is about the same size as a deck of cards. One egg equals 1 oz. ? 2 servings of low-fat dairy each day. ? A serving of nuts, seeds, or beans 5 times each week. ? Heart-healthy fats. Healthy fats called Omega-3 fatty acids are found in foods such as flaxseeds and coldwater fish, like sardines, salmon, and mackerel.  Limit how much you eat of the following: ? Canned or prepackaged foods. ? Food that is high in trans fat, such as fried foods. ? Food that is high in saturated fat, such as fatty meat. ? Sweets, desserts, sugary drinks, and other foods with added sugar. ? Full-fat dairy products.  Do not salt foods before eating.  Try to eat at least 2 vegetarian meals each week.  Eat more home-cooked food and less restaurant, buffet, and fast food.  When eating at a restaurant, ask that your food be prepared with less salt or no salt, if possible. What foods are recommended? The items listed may not be a complete list. Talk with your dietitian about what   dietary choices are best for you. Grains Whole-grain or whole-wheat bread. Whole-grain or whole-wheat pasta. Brown rice. Oatmeal. Quinoa. Bulgur. Whole-grain and low-sodium cereals. Pita bread. Low-fat, low-sodium crackers. Whole-wheat flour tortillas. Vegetables Fresh or frozen vegetables (raw, steamed, roasted, or grilled). Low-sodium or reduced-sodium tomato and vegetable juice. Low-sodium or reduced-sodium tomato sauce and tomato paste. Low-sodium or reduced-sodium canned vegetables. Fruits All fresh, dried, or frozen fruit. Canned fruit in natural juice (without  added sugar). Meat and other protein foods Skinless chicken or turkey. Ground chicken or turkey. Pork with fat trimmed off. Fish and seafood. Egg whites. Dried beans, peas, or lentils. Unsalted nuts, nut butters, and seeds. Unsalted canned beans. Lean cuts of beef with fat trimmed off. Low-sodium, lean deli meat. Dairy Low-fat (1%) or fat-free (skim) milk. Fat-free, low-fat, or reduced-fat cheeses. Nonfat, low-sodium ricotta or cottage cheese. Low-fat or nonfat yogurt. Low-fat, low-sodium cheese. Fats and oils Soft margarine without trans fats. Vegetable oil. Low-fat, reduced-fat, or light mayonnaise and salad dressings (reduced-sodium). Canola, safflower, olive, soybean, and sunflower oils. Avocado. Seasoning and other foods Herbs. Spices. Seasoning mixes without salt. Unsalted popcorn and pretzels. Fat-free sweets. What foods are not recommended? The items listed may not be a complete list. Talk with your dietitian about what dietary choices are best for you. Grains Baked goods made with fat, such as croissants, muffins, or some breads. Dry pasta or rice meal packs. Vegetables Creamed or fried vegetables. Vegetables in a cheese sauce. Regular canned vegetables (not low-sodium or reduced-sodium). Regular canned tomato sauce and paste (not low-sodium or reduced-sodium). Regular tomato and vegetable juice (not low-sodium or reduced-sodium). Pickles. Olives. Fruits Canned fruit in a light or heavy syrup. Fried fruit. Fruit in cream or butter sauce. Meat and other protein foods Fatty cuts of meat. Ribs. Fried meat. Bacon. Sausage. Bologna and other processed lunch meats. Salami. Fatback. Hotdogs. Bratwurst. Salted nuts and seeds. Canned beans with added salt. Canned or smoked fish. Whole eggs or egg yolks. Chicken or turkey with skin. Dairy Whole or 2% milk, cream, and half-and-half. Whole or full-fat cream cheese. Whole-fat or sweetened yogurt. Full-fat cheese. Nondairy creamers. Whipped toppings.  Processed cheese and cheese spreads. Fats and oils Butter. Stick margarine. Lard. Shortening. Ghee. Bacon fat. Tropical oils, such as coconut, palm kernel, or palm oil. Seasoning and other foods Salted popcorn and pretzels. Onion salt, garlic salt, seasoned salt, table salt, and sea salt. Worcestershire sauce. Tartar sauce. Barbecue sauce. Teriyaki sauce. Soy sauce, including reduced-sodium. Steak sauce. Canned and packaged gravies. Fish sauce. Oyster sauce. Cocktail sauce. Horseradish that you find on the shelf. Ketchup. Mustard. Meat flavorings and tenderizers. Bouillon cubes. Hot sauce and Tabasco sauce. Premade or packaged marinades. Premade or packaged taco seasonings. Relishes. Regular salad dressings. Where to find more information:  National Heart, Lung, and Blood Institute: www.nhlbi.nih.gov  American Heart Association: www.heart.org Summary  The DASH eating plan is a healthy eating plan that has been shown to reduce high blood pressure (hypertension). It may also reduce your risk for type 2 diabetes, heart disease, and stroke.  With the DASH eating plan, you should limit salt (sodium) intake to 2,300 mg a day. If you have hypertension, you may need to reduce your sodium intake to 1,500 mg a day.  When on the DASH eating plan, aim to eat more fresh fruits and vegetables, whole grains, lean proteins, low-fat dairy, and heart-healthy fats.  Work with your health care provider or diet and nutrition specialist (dietitian) to adjust your eating plan to your individual   calorie needs. This information is not intended to replace advice given to you by your health care provider. Make sure you discuss any questions you have with your health care provider. Document Released: 03/29/2011 Document Revised: 04/02/2016 Document Reviewed: 04/02/2016 Elsevier Interactive Patient Education  2018 Elsevier Inc.  

## 2017-08-10 NOTE — Assessment & Plan Note (Signed)
  Will monitor PSA yearly

## 2017-08-10 NOTE — Assessment & Plan Note (Signed)
Controlled off CPAP Sleeps well with Trazadone

## 2017-08-10 NOTE — Assessment & Plan Note (Signed)
Controlled on Amlodipine Will check CMET at annual exam

## 2017-08-29 ENCOUNTER — Other Ambulatory Visit: Payer: Self-pay | Admitting: Internal Medicine

## 2017-09-26 ENCOUNTER — Other Ambulatory Visit: Payer: Self-pay | Admitting: Internal Medicine

## 2017-09-26 NOTE — Telephone Encounter (Signed)
Last filled 07/31/17 #60 with 1 refill... Now requesting 90 day supply #180... Please advise

## 2017-09-26 NOTE — Telephone Encounter (Signed)
Approved:I think he has officially switched to [facility] I am okay with #[phone] x 0 till this is clarified

## 2017-09-27 NOTE — Telephone Encounter (Signed)
He has established care with me, thank you for refilling though.

## 2017-10-08 ENCOUNTER — Other Ambulatory Visit: Payer: Self-pay | Admitting: Internal Medicine

## 2017-10-14 ENCOUNTER — Telehealth: Payer: Self-pay

## 2017-10-14 NOTE — Telephone Encounter (Signed)
This pt has not been seen since 07/2017, and pt is in independent living per [facility]

## 2017-10-14 NOTE — Telephone Encounter (Signed)
Copied from [place] [phone]. Topic: General - Other >> Oct 14, 2017 10:00 AM Keene Breath wrote: Reason for CRM: Chrystie Nose with [facility] called requesting Physician Notes for Nursing Assessment for patient.  CB# [phone], Fax# [phone].  Please call to verify as soon as possible.

## 2017-10-14 NOTE — Telephone Encounter (Signed)
Spoke to [place] and pt is supposed to be transitioning to assisted living and they wanting visit notes to have in chart to start with... Last 2 OV notes faxed as requested

## 2017-10-14 NOTE — Telephone Encounter (Signed)
Copied from [place] [id number]. Topic: General - Other >> Oct 14, 2017  2:40 PM Burchel, Abbi R wrote: Reason for CRM:   Margarita Grizzle from [employer] called to get clarification on forms filled out on pt's behalf.  Please call back to discuss: [phone]  >> Oct 14, 2017  3:02 PM Helene Shoe, LPN wrote: Karen Kays to reach Lauri at [facility] x 2.

## 2017-10-15 NOTE — Telephone Encounter (Signed)
Margarita Grizzle from [employer] called, contact back for clarification

## 2017-10-16 NOTE — Telephone Encounter (Signed)
Michael Kane, but she did not answer left msg with representative to have her return my call

## 2017-10-16 NOTE — Telephone Encounter (Signed)
I spoke with Cecille Rubin in Claims... She wanted to confirm if you had originally crossed out Yes and selected No where it asked if pt continuous care... She wanted to make sure you were aware that his does not mean 24 hr car but it means pt needs help with basic daily needs not around the clock... Please advise if you need me to print of the forms and have you circle, initial and refax... If needed will need to fax ATTN Lori--Claims

## 2017-10-17 NOTE — Telephone Encounter (Signed)
Lorie called to check status. Best call back [phone] ext [phone].

## 2017-10-17 NOTE — Telephone Encounter (Signed)
Please print out form for me to review.

## 2017-10-17 NOTE — Telephone Encounter (Signed)
Reviewed the notes. It should be yes to continuous care, [place]. Has aide to drive him now. Will update form and place in MYD box

## 2017-10-17 NOTE — Telephone Encounter (Signed)
Left message on voicemail for Michael Kane--- forms have been faxed

## 2017-10-29 ENCOUNTER — Telehealth: Payer: Self-pay | Admitting: Internal Medicine

## 2017-10-29 NOTE — Telephone Encounter (Signed)
Copied from [place] [id number]. Topic: General - Other >> Oct 29, 2017  4:02 PM Margot Ables wrote: Reason for CRM: Debbie called to f/u on FL2 form. This is needed prior to pt moving from independent to assisted living. They are hoping to move pt on Monday 11/04/17 and would like form returned by Thursday 10/31/17. The form was faxed Friday 10/25/17 to [phone].

## 2017-10-29 NOTE — Telephone Encounter (Signed)
Michael Kane has not been in the office and just received form, will let her know when form is ready, but Michael Kane may take it with her to [facility] tomorrow as she does rounds tomorrow Wed morning

## 2017-10-30 NOTE — Telephone Encounter (Signed)
Form faxed to [facility]

## 2017-10-30 NOTE — Telephone Encounter (Signed)
Done, placed in MYD box to fax back

## 2017-11-12 ENCOUNTER — Encounter: Payer: Medicare Other | Admitting: Internal Medicine

## 2017-11-26 ENCOUNTER — Encounter: Payer: Medicare Other | Admitting: Internal Medicine

## 2017-12-13 ENCOUNTER — Ambulatory Visit: Payer: Medicare Other | Admitting: Internal Medicine

## 2017-12-13 DIAGNOSIS — F0151 Vascular dementia with behavioral disturbance: Secondary | ICD-10-CM | POA: Diagnosis not present

## 2017-12-13 DIAGNOSIS — G4733 Obstructive sleep apnea (adult) (pediatric): Secondary | ICD-10-CM | POA: Diagnosis not present

## 2017-12-13 DIAGNOSIS — I456 Pre-excitation syndrome: Secondary | ICD-10-CM | POA: Diagnosis not present

## 2017-12-13 DIAGNOSIS — Z8546 Personal history of malignant neoplasm of prostate: Secondary | ICD-10-CM

## 2017-12-13 DIAGNOSIS — I10 Essential (primary) hypertension: Secondary | ICD-10-CM

## 2017-12-13 DIAGNOSIS — F01518 Vascular dementia, unspecified severity, with other behavioral disturbance: Secondary | ICD-10-CM

## 2017-12-17 ENCOUNTER — Encounter: Payer: Medicare Other | Admitting: Internal Medicine

## 2017-12-19 ENCOUNTER — Encounter: Payer: Self-pay | Admitting: Internal Medicine

## 2017-12-19 NOTE — Assessment & Plan Note (Signed)
No CPAP use Sleeps well with Trazadone

## 2017-12-19 NOTE — Assessment & Plan Note (Signed)
Controlled on Amlodipine Monitor BP

## 2017-12-19 NOTE — Progress Notes (Signed)
Subjective:    Patient ID: Michael Kane, male    DOB: 1929/01/19, 84 y.o.   MRN: [id number]  HPI  Routine follow up for resident in [address] RN reports she is not sure if hearing aids working, he seems to yell when he talks to everyone. He is also abrasive with other residents at times, telling them to "shut up". RN resident walks all over campus, sometimes can not remember how to get back to ALF. She also noticed he is not showering or changing his clothes. Resident denies concerns, although he reports his dog died yesterday. He is sleeping well, walks without a device. He reports he only showers at the pool, and averages 1-2 times per week. His appetite is good. Weight down through transition but eating well. He has intermittent diarrhea, but no abdominal pain or blood in his stool. He denies pain, reflux or shortness of breath.  WPW: Asymptomatic. No meds. He does not follow with cardiology.  HTN: Reasonable control with Amlodipine. ECG from 09/2014 reviewed.  OSA: He reports he is sleep well with Trazadone. No CPAP use.  History of Prostate Cancer: s/p radiation many years ago. No further RX.  Vascular Dementia: Mild congnitive issues, stable functional status. No meds.  Review of Systems  Past Medical History:  Diagnosis Date  . History of prostate cancer    RT 2006  . Hx of colonic polyps   . Hypertension   . Mild cognitive impairment   . OSA (obstructive sleep apnea)    on CPAP of 10  . Rosacea   . Tremor, essential   . Wolff-Parkinson-White syndrome 1970    Current Outpatient Medications  Medication Sig Dispense Refill  . amLODipine (NORVASC) 10 MG tablet TAKE 1 TABLET BY MOUTH EVERY DAY 90 tablet 3  . Multiple Vitamins-Minerals (MENS MULTI VITAMIN & MINERAL PO) Take by mouth daily.      . traZODone (DESYREL) 100 MG tablet TAKE 2 TABLETS (200 MG TOTAL) BY MOUTH AT BEDTIME. 180 tablet 1   No current facility-administered medications for this visit.     Allergies   Allergen Reactions  . Lisinopril     REACTION: cough    Family History  Problem Relation Age of Onset  . Stroke Brother     Social History   Socioeconomic History  . Marital status: Widowed    Spouse name: Not on file  . Number of children: 3  . Years of education: Not on file  . Highest education level: Not on file  Occupational History  . 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 Financial resource strain: Not on file  . Food insecurity:    Worry: Not on file    Inability: Not on file  . Transportation needs:    Medical: Not on file    Non-medical: Not on file  Tobacco Use  . Smoking status: Former Smoker    Types: Cigarettes    Last attempt to quit: 04/23/1958    Years since quitting: 59.6  . Smokeless tobacco: Never Used  Substance and Sexual Activity  . Alcohol use: Yes    Alcohol/week: 0.0 standard drinks    Comment: 1 beer daily  . Drug use: No  . Sexual activity: Not on file  Lifestyle  . Physical activity:    Days per week: Not on file    Minutes per session: Not on file  . Stress: Not on file  Relationships  .  Social connections:    Talks on phone: Not on file    Gets together: Not on file    Attends religious service: Not on file    Active member of club or organization: Not on file    Attends meetings of clubs or organizations: Not on file    Relationship status: Not on file  . Intimate partner violence:    Fear of current or ex partner: Not on file    Emotionally abused: Not on file    Physically abused: Not on file    Forced sexual activity: Not on file  Other Topics Concern  . Not on file  Social History Narrative   Wrote poetry and  short stories.  Occ memoirs (has been published)      Has living will.    Requested DNR so form completed.    Requests daughter [name] as health care POA.    Would accept feeding tube unless vegetative state           Constitutional: Noted weight loss. Denies  fever, malaise, fatigue, headache.  HEENT: Denies eye pain, eye redness, ear pain, ringing in the ears, wax buildup, runny nose, nasal congestion, bloody nose, or sore throat. Respiratory: Denies difficulty breathing, shortness of breath, cough or sputum production.   Cardiovascular: Denies chest pain, chest tightness, palpitations or swelling in the hands or feet.  Gastrointestinal: Pt reports intermittent diarrhea. Denies abdominal pain, bloating, constipation, or blood in the stool.  GU: Denies urgency, frequency, pain with urination, burning sensation, blood in urine, odor or discharge. Musculoskeletal: Denies decrease in range of motion, difficulty with gait, muscle pain or joint pain and swelling.  Skin: Denies redness, rashes, lesions or ulcercations.  Neurological: Pt reports difficulty with memory, insomnia. Denies dizziness, difficulty with speech or problems with balance and coordination.  Psych: Denies anxiety, depression, SI/HI.  No other specific complaints in a complete review of systems (except as listed in HPI above).     Objective:   Physical Exam   BP (!) 152/70   Pulse 72   Temp 98 F (36.7 C)   Resp 20   Wt 154 lb 9.6 oz (70.1 kg)   BMI 24.40 kg/m  Wt Readings from Last 3 Encounters:  12/19/17 154 lb 9.6 oz (70.1 kg)  08/01/17 171 lb (77.6 kg)  08/21/16 167 lb 4 oz (75.9 kg)    General: Appears his stated age, well developed, well nourished in NAD. Neck: No nodes or JVD. Cardiovascular: Normal rate and rhythm. S1,S2 noted.  No murmur, rubs or gallops noted. No JVD or BLE edema. No carotid bruits noted. Pulmonary/Chest: Normal effort and positive vesicular breath sounds. No respiratory distress. No wheezes, rales or ronchi noted.  Abdomen: Soft and nontender. Normal bowel sounds.  Musculoskeletal: No difficulty with gait.  Neurological: Alert and oriented. Mildly confused (he is taking his dirty laundry out of basket, folding it and putting it  away)   BMET    Component Value Date/Time   NA 140 05/29/2016 1100   NA 132 (L) 07/11/2012 0425   K 4.1 05/29/2016 1100   K 3.9 07/11/2012 0425   CL 105 05/29/2016 1100   CL 99 07/11/2012 0425   CO2 30 05/29/2016 1100   CO2 25 07/11/2012 0425   GLUCOSE 91 05/29/2016 1100   GLUCOSE 100 (H) 07/11/2012 0425   BUN 20 05/29/2016 1100   BUN 16 07/11/2012 0425   CREATININE 1.06 05/29/2016 1100   CREATININE 0.86 07/11/2012 0425  CALCIUM 9.1 05/29/2016 1100   CALCIUM 8.3 (L) 07/11/2012 0425   GFRNONAA >60 07/11/2012 0425   GFRAA >60 07/11/2012 0425    Lipid Panel  No results found for: CHOL, TRIG, HDL, CHOLHDL, VLDL, LDLCALC  CBC    Component Value Date/Time   WBC 6.0 05/29/2016 1100   RBC 4.54 05/29/2016 1100   HGB 14.7 05/29/2016 1100   HGB 12.3 (L) 07/11/2012 0425   HCT 43.3 05/29/2016 1100   HCT 40.6 06/16/2012 0910   PLT 222.0 05/29/2016 1100   PLT 152 07/11/2012 0425   MCV 95.4 05/29/2016 1100   MCV 96 06/16/2012 0910   MCH 33.2 06/16/2012 0910   MCHC 34.0 05/29/2016 1100   RDW 14.2 05/29/2016 1100   RDW 13.6 06/16/2012 0910   LYMPHSABS 1.7 05/29/2016 1100   MONOABS 0.8 05/29/2016 1100   EOSABS 0.2 05/29/2016 1100   BASOSABS 0.0 05/29/2016 1100    Hgb A1C No results found for: HGBA1C         Assessment & Plan:

## 2017-12-19 NOTE — Assessment & Plan Note (Signed)
No further RX

## 2017-12-19 NOTE — Assessment & Plan Note (Signed)
Asymptomatic  Monitor

## 2017-12-19 NOTE — Assessment & Plan Note (Signed)
Will see how he settles out If continues to have behavior issues, will trial Namenda Appreciate ALF care at this time

## 2018-02-20 ENCOUNTER — Encounter: Payer: Self-pay | Admitting: Internal Medicine

## 2018-02-20 ENCOUNTER — Ambulatory Visit: Payer: Medicare Other | Admitting: Internal Medicine

## 2018-02-20 VITALS — BP 128/76 | HR 55 | Temp 98.2°F | Resp 20 | Wt 181.0 lb

## 2018-02-20 DIAGNOSIS — I350 Nonrheumatic aortic (valve) stenosis: Secondary | ICD-10-CM

## 2018-02-20 DIAGNOSIS — F0150 Vascular dementia without behavioral disturbance: Secondary | ICD-10-CM | POA: Diagnosis not present

## 2018-02-20 DIAGNOSIS — I10 Essential (primary) hypertension: Secondary | ICD-10-CM

## 2018-02-20 DIAGNOSIS — Z8546 Personal history of malignant neoplasm of prostate: Secondary | ICD-10-CM

## 2018-02-20 MED ORDER — HYDROCHLOROTHIAZIDE 25 MG PO TABS: 25.0000 mg | ORAL_TABLET | Freq: Every day | ORAL | 3 refills | Status: DC

## 2018-02-20 NOTE — Assessment & Plan Note (Signed)
Mild and no symptoms Will watch BP on increased medications

## 2018-02-20 NOTE — Assessment & Plan Note (Signed)
Increased need for supervision, instrumental ADLs, medication management Now doing well in AL

## 2018-02-20 NOTE — Assessment & Plan Note (Signed)
No evidence of recurrence Will not recheck labs

## 2018-02-20 NOTE — Assessment & Plan Note (Signed)
BP Readings from Last 3 Encounters:  02/20/18 128/76  12/19/17 (!) 152/70  08/01/17 138/74   Had been high lately Will add the HCTZ Check labs

## 2018-02-20 NOTE — Progress Notes (Signed)
Subjective:    Patient ID: Michael Kane, male    DOB: [birth year], 84 y.o.   MRN: [id number]  HPI Visit in [place] apartment for review of chronic medical problems Reviewed status with Luellen Pucker RN  He has been noted to have elevated BP in the past few days As high as 183/73 Got dose of HCTZ last night---better now No headaches No chest pain No SOB  He feels that he has adjusted here well Closer to the pool ---swims regularly then usually showers there Still generally independent with ADLs Staff do medication management Generally continent  Current Outpatient Medications on File Prior to Visit  Medication Sig Dispense Refill  . amLODipine (NORVASC) 10 MG tablet TAKE 1 TABLET BY MOUTH EVERY DAY 90 tablet 3  . memantine (NAMENDA) 5 MG tablet Take 5 mg by mouth 2 (two) times daily.    . Multiple Vitamins-Minerals (MENS MULTI VITAMIN & MINERAL PO) Take by mouth daily.      . traZODone (DESYREL) 100 MG tablet TAKE 2 TABLETS (200 MG TOTAL) BY MOUTH AT BEDTIME. 180 tablet 1   No current facility-administered medications on file prior to visit.     Allergies  Allergen Reactions  . Lisinopril     REACTION: cough    Past Medical History:  Diagnosis Date  . History of prostate cancer    RT 2006  . Hx of colonic polyps   . Hypertension   . Mild cognitive impairment   . OSA (obstructive sleep apnea)    on CPAP of 10  . Rosacea   . Tremor, essential   . Wolff-Parkinson-White syndrome 1970    Past Surgical History:  Procedure Laterality Date  . TONSILLECTOMY    . TOTAL KNEE ARTHROPLASTY Left 3/14    Family History  Problem Relation Age of Onset  . Stroke Brother     Social History   Socioeconomic History  . Marital status: Widowed    Spouse name: Not on file  . Number of children: 3  . Years of education: Not on file  . Highest education level: Not on file  Occupational History  . 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  Financial resource strain: Not on file  . Food insecurity:    Worry: Not on file    Inability: Not on file  . Transportation needs:    Medical: Not on file    Non-medical: Not on file  Tobacco Use  . Smoking status: Former Smoker    Types: Cigarettes    Last attempt to quit: 04/23/1958    Years since quitting: 59.8  . Smokeless tobacco: Never Used  Substance and Sexual Activity  . Alcohol use: Yes    Alcohol/week: 0.0 standard drinks    Comment: 1 beer daily  . Drug use: No  . Sexual activity: Not on file  Lifestyle  . Physical activity:    Days per week: Not on file    Minutes per session: Not on file  . Stress: Not on file  Relationships  . Social connections:    Talks on phone: Not on file    Gets together: Not on file    Attends religious service: Not on file    Active member of club or organization: Not on file    Attends meetings of clubs or organizations: Not on file    Relationship status: Not on file  . Intimate partner violence:    Fear of  current or ex partner: Not on file    Emotionally abused: Not on file    Physically abused: Not on file    Forced sexual activity: Not on file  Other Topics Concern  . Not on file  Social History Narrative   Wrote poetry and  short stories.  Occ memoirs (has been published)      Has living will.    Requested DNR so form completed.    Requests daughter [name] as health care POA.    Would accept feeding tube unless vegetative state         Review of Systems  Sleeps well Appetite is good Weight up a bit since moving here---eating bettter No sig back or joint pain Voids okay     Objective:   Physical Exam  Constitutional: He appears well-developed. No distress.  Neck: No thyromegaly present.  Cardiovascular: Normal rate and regular rhythm. Exam reveals no gallop.  Gr 2/6 systolic murmur across precordium  Respiratory: Effort normal and breath sounds normal. No respiratory distress. He has no wheezes. He has no  rales.  GI: Soft. There is no tenderness.  Musculoskeletal: He exhibits no edema.  Lymphadenopathy:    He has no cervical adenopathy.  Psychiatric: He has a normal mood and affect. His behavior is normal.           Assessment & Plan:

## 2018-03-06 ENCOUNTER — Encounter: Payer: Self-pay | Admitting: Internal Medicine

## 2018-03-06 DIAGNOSIS — F0150 Vascular dementia without behavioral disturbance: Secondary | ICD-10-CM | POA: Diagnosis not present

## 2018-03-06 DIAGNOSIS — I10 Essential (primary) hypertension: Secondary | ICD-10-CM | POA: Diagnosis not present

## 2018-05-11 ENCOUNTER — Encounter: Payer: Self-pay | Admitting: Internal Medicine

## 2018-05-14 ENCOUNTER — Ambulatory Visit: Payer: Medicare Other | Admitting: Internal Medicine

## 2018-05-14 ENCOUNTER — Encounter: Payer: Self-pay | Admitting: Internal Medicine

## 2018-05-14 VITALS — BP 178/83 | HR 67 | Temp 97.9°F | Resp 16 | Wt 185.2 lb

## 2018-05-14 DIAGNOSIS — J029 Acute pharyngitis, unspecified: Secondary | ICD-10-CM | POA: Diagnosis not present

## 2018-05-14 DIAGNOSIS — R0982 Postnasal drip: Secondary | ICD-10-CM | POA: Diagnosis not present

## 2018-05-14 NOTE — Patient Instructions (Signed)
Sore Throat  When you have a sore throat, your throat may feel:  · Tender.  · Burning.  · Irritated.  · Scratchy.  · Painful when you swallow.  · Painful when you talk.  Many things can cause a sore throat, such as:  · An infection.  · Allergies.  · Dry air.  · Smoke or pollution.  · Radiation treatment.  · Gastroesophageal reflux disease (GERD).  · A tumor.  A sore throat can be the first sign of another sickness. It can happen with other problems, like:  · Coughing.  · Sneezing.  · Fever.  · Swelling in the neck.  Most sore throats go away without treatment.  Follow these instructions at home:         · Take over-the-counter medicines only as told by your doctor.  ? If your child has a sore throat, do not give your child aspirin.  · Drink enough fluids to keep your pee (urine) pale yellow.  · Rest when you feel you need to.  · To help with pain:  ? Sip warm liquids, such as broth, herbal tea, or warm water.  ? Eat or drink cold or frozen liquids, such as frozen ice pops.  ? Gargle with a salt-water mixture 3-4 times a day or as needed. To make a salt-water mixture, add ½-1 tsp (3-6 g) of salt to 1 cup (237 mL) of warm water. Mix it until you cannot see the salt anymore.  ? Suck on hard candy or throat lozenges.  ? Put a cool-mist humidifier in your bedroom at night.  ? Sit in the bathroom with the door closed for 5-10 minutes while you run hot water in the shower.  · Do not use any products that contain nicotine or tobacco, such as cigarettes, e-cigarettes, and chewing tobacco. If you need help quitting, ask your doctor.  · Wash your hands well and often with soap and water. If soap and water are not available, use hand sanitizer.  Contact a doctor if:  · You have a fever for more than 2-3 days.  · You keep having symptoms for more than 2-3 days.  · Your throat does not get better in 7 days.  · You have a fever and your symptoms suddenly get worse.  · Your child who is 3 months to 3 years old has a temperature of  102.2°F (39°C) or higher.  Get help right away if:  · You have trouble breathing.  · You cannot swallow fluids, soft foods, or your saliva.  · You have swelling in your throat or neck that gets worse.  · You keep feeling sick to your stomach (nauseous).  · You keep throwing up (vomiting).  Summary  · A sore throat is pain, burning, irritation, or scratchiness in the throat. Many things can cause a sore throat.  · Take over-the-counter medicines only as told by your doctor. Do not give your child aspirin.  · Drink plenty of fluids, and rest as needed.  · Contact a doctor if your symptoms get worse or your sore throat does not get better within 7 days.  This information is not intended to replace advice given to you by your health care provider. Make sure you discuss any questions you have with your health care provider.  Document Released: 01/17/2008 Document Revised: 09/09/2017 Document Reviewed: 09/09/2017  Elsevier Interactive Patient Education © 2019 [employer] Inc[employer]

## 2018-05-14 NOTE — Progress Notes (Signed)
Subjective:    Patient ID: Michael Kane, male    DOB: 1928-12-18, 85 y.o.   MRN: [id number]  HPI  Asked to see resident in [address] with c/o sore throat and lymph node swelling.  Started 2 days ago He reports difficulty swallowing No runny nose, nasal congestion, ear pain, cough or shortness of breath No fever, chills or body aches He has not tried anything OTC for his symptoms. He denies acid reflux  Review of Systems      Past Medical History:  Diagnosis Date  . History of prostate cancer    RT 2006  . Hx of colonic polyps   . Hypertension   . Mild cognitive impairment   . OSA (obstructive sleep apnea)    on CPAP of 10  . Rosacea   . Tremor, essential   . Wolff-Parkinson-White syndrome 1970    Current Outpatient Medications  Medication Sig Dispense Refill  . amLODipine (NORVASC) 10 MG tablet TAKE 1 TABLET BY MOUTH EVERY DAY 90 tablet 3  . hydrochlorothiazide (HYDRODIURIL) 25 MG tablet Take 1 tablet (25 mg total) by mouth daily. 90 tablet 3  . memantine (NAMENDA) 5 MG tablet Take 5 mg by mouth 2 (two) times daily.    . Multiple Vitamins-Minerals (MENS MULTI VITAMIN & MINERAL PO) Take by mouth daily.      . traZODone (DESYREL) 100 MG tablet TAKE 2 TABLETS (200 MG TOTAL) BY MOUTH AT BEDTIME. 180 tablet 1   No current facility-administered medications for this visit.     Allergies  Allergen Reactions  . Lisinopril     REACTION: cough    Family History  Problem Relation Age of Onset  . Stroke Brother     Social History   Socioeconomic History  . Marital status: Widowed    Spouse name: Not on file  . Number of children: 3  . Years of education: Not on file  . Highest education level: Not on file  Occupational History  . 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 Financial resource strain: Not on file  . Food insecurity:    Worry: Not on file    Inability: Not on file  . Transportation needs:    Medical: Not on  file    Non-medical: Not on file  Tobacco Use  . Smoking status: Former Smoker    Types: Cigarettes    Last attempt to quit: 04/23/1958    Years since quitting: 60.0  . Smokeless tobacco: Never Used  Substance and Sexual Activity  . Alcohol use: Yes    Alcohol/week: 0.0 standard drinks    Comment: 1 beer daily  . Drug use: No  . Sexual activity: Not on file  Lifestyle  . Physical activity:    Days per week: Not on file    Minutes per session: Not on file  . Stress: Not on file  Relationships  . Social connections:    Talks on phone: Not on file    Gets together: Not on file    Attends religious service: Not on file    Active member of club or organization: Not on file    Attends meetings of clubs or organizations: Not on file    Relationship status: Not on file  . Intimate partner violence:    Fear of current or ex partner: Not on file    Emotionally abused: Not on file    Physically abused: Not on file  Forced sexual activity: Not on file  Other Topics Concern  . Not on file  Social History Narrative   Wrote poetry and  short stories.  Occ memoirs (has been published)      Has living will.    Requested DNR so form completed.    Requests daughter [name] as health care POA.    Would accept feeding tube unless vegetative state           Constitutional: Denies fever, malaise, fatigue, headache or abrupt weight changes.  HEENT: Pt reports sore throat. Denies eye pain, eye redness, ear pain, ringing in the ears, wax buildup, runny nose, nasal congestion, bloody nose. Respiratory: Denies difficulty breathing, shortness of breath, cough or sputum production.   Cardiovascular: Denies chest pain, chest tightness, palpitations or swelling in the hands or feet.    No other specific complaints in a complete review of systems (except as listed in HPI above).  Objective:   Physical Exam   BP (!) 178/83   Pulse 67   Temp 97.9 F (36.6 C)   Resp 16   Wt 185 lb 3.2 oz  (84 kg)   BMI 29.22 kg/m  Wt Readings from Last 3 Encounters:  05/14/18 185 lb 3.2 oz (84 kg)  02/20/18 181 lb (82.1 kg)  12/19/17 154 lb 9.6 oz (70.1 kg)    General: Appears huis stated age, well developed, well nourished in NAD. HEENT: Head: normal shape and size; Throat/Mouth: Teeth present, mucosa erythematous and moist, + PND,  no tonsillar swelling, no exudate, lesions or ulcerations noted.  Neck:  No adenopathy noted. Cardiovascular: Normal rate and rhythm. Murmur noted. Pulmonary/Chest: Normal effort and positive vesicular breath sounds. No respiratory distress. No wheezes, rales or ronchi noted.  Neurological: Alert and oriented.   BMET    Component Value Date/Time   NA 140 05/29/2016 1100   NA 132 (L) 07/11/2012 0425   K 4.1 05/29/2016 1100   K 3.9 07/11/2012 0425   CL 105 05/29/2016 1100   CL 99 07/11/2012 0425   CO2 30 05/29/2016 1100   CO2 25 07/11/2012 0425   GLUCOSE 91 05/29/2016 1100   GLUCOSE 100 (H) 07/11/2012 0425   BUN 20 05/29/2016 1100   BUN 16 07/11/2012 0425   CREATININE 1.06 05/29/2016 1100   CREATININE 0.86 07/11/2012 0425   CALCIUM 9.1 05/29/2016 1100   CALCIUM 8.3 (L) 07/11/2012 0425   GFRNONAA >60 07/11/2012 0425   GFRAA >60 07/11/2012 0425    Lipid Panel  No results found for: CHOL, TRIG, HDL, CHOLHDL, VLDL, LDLCALC  CBC    Component Value Date/Time   WBC 6.0 05/29/2016 1100   RBC 4.54 05/29/2016 1100   HGB 14.7 05/29/2016 1100   HGB 12.3 (L) 07/11/2012 0425   HCT 43.3 05/29/2016 1100   HCT 40.6 06/16/2012 0910   PLT 222.0 05/29/2016 1100   PLT 152 07/11/2012 0425   MCV 95.4 05/29/2016 1100   MCV 96 06/16/2012 0910   MCH 33.2 06/16/2012 0910   MCHC 34.0 05/29/2016 1100   RDW 14.2 05/29/2016 1100   RDW 13.6 06/16/2012 0910   LYMPHSABS 1.7 05/29/2016 1100   MONOABS 0.8 05/29/2016 1100   EOSABS 0.2 05/29/2016 1100   BASOSABS 0.0 05/29/2016 1100    Hgb A1C No results found for: HGBA1C         Assessment & Plan:    Sore Throat, PND:  Start Claritin 10 mg daily x 7 days Decadron 10 mg IM x 1 Encouraged  him to sip on cool liquids Can take Ibuprofen 400 mg Q8H prn short term  Will reassess as needed Webb Silversmith, NP

## 2018-06-25 ENCOUNTER — Ambulatory Visit: Payer: Medicare Other | Admitting: Internal Medicine

## 2018-06-25 DIAGNOSIS — I456 Pre-excitation syndrome: Secondary | ICD-10-CM | POA: Diagnosis not present

## 2018-06-25 DIAGNOSIS — G4733 Obstructive sleep apnea (adult) (pediatric): Secondary | ICD-10-CM

## 2018-06-25 DIAGNOSIS — I10 Essential (primary) hypertension: Secondary | ICD-10-CM

## 2018-06-25 DIAGNOSIS — F0150 Vascular dementia without behavioral disturbance: Secondary | ICD-10-CM | POA: Diagnosis not present

## 2018-07-04 ENCOUNTER — Encounter: Payer: Self-pay | Admitting: Internal Medicine

## 2018-07-04 NOTE — Assessment & Plan Note (Signed)
Asymptomatic Continue Amlodipine and HCTZ

## 2018-07-04 NOTE — Patient Instructions (Signed)

## 2018-07-04 NOTE — Assessment & Plan Note (Signed)
Continue Trazodone for sleep No CPAP at this time

## 2018-07-04 NOTE — Progress Notes (Signed)
Subjective:    Patient ID: Michael Kane, male    DOB: 1928/09/06, 85 y.o.   MRN: [id number]  HPI  Routine visit for resident in [address]. Reviewed with RN, no new concerns at this time Resident denies concerns Sleeps well, independent with ADLs.  Walks without device. Appetite good, weight stable. He denies issues with bowel or bladder. He denies pain, reflux or shortness of breath.   WPW: Asymptomatic.  On Amlodipine and HCTZ.  ECG from 09/2014 reviewed.  Vascular Dementia: Mild cognitive issues.  He is taking Namenda as prescribed.  OSA: He sleeps well with trazodone.  He is currently not using a CPAP machine.  HTN: His BP today is 144/76.  He is taking Amlodipine and HCTZ as prescribed.  Review of Systems      Past Medical History:  Diagnosis Date  . History of prostate cancer    RT 2006  . Hx of colonic polyps   . Hypertension   . Mild cognitive impairment   . OSA (obstructive sleep apnea)    on CPAP of 10  . Rosacea   . Tremor, essential   . Wolff-Parkinson-White syndrome 1970    Current Outpatient Medications  Medication Sig Dispense Refill  . amLODipine (NORVASC) 10 MG tablet TAKE 1 TABLET BY MOUTH EVERY DAY 90 tablet 3  . hydrochlorothiazide (HYDRODIURIL) 25 MG tablet Take 1 tablet (25 mg total) by mouth daily. 90 tablet 3  . memantine (NAMENDA) 5 MG tablet Take 5 mg by mouth 2 (two) times daily.    . Multiple Vitamins-Minerals (MENS MULTI VITAMIN & MINERAL PO) Take by mouth daily.      . traZODone (DESYREL) 100 MG tablet TAKE 2 TABLETS (200 MG TOTAL) BY MOUTH AT BEDTIME. 180 tablet 1   No current facility-administered medications for this visit.     Allergies  Allergen Reactions  . Lisinopril     REACTION: cough    Family History  Problem Relation Age of Onset  . Stroke Brother     Social History   Socioeconomic History  . Marital status: Widowed    Spouse name: Not on file  . Number of children: 3  . Years of education: Not on file  .  Highest education level: Not on file  Occupational History  . 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 Financial resource strain: Not on file  . Food insecurity:    Worry: Not on file    Inability: Not on file  . Transportation needs:    Medical: Not on file    Non-medical: Not on file  Tobacco Use  . Smoking status: Former Smoker    Types: Cigarettes    Last attempt to quit: 04/23/1958    Years since quitting: 60.2  . Smokeless tobacco: Never Used  Substance and Sexual Activity  . Alcohol use: Yes    Alcohol/week: 0.0 standard drinks    Comment: 1 beer daily  . Drug use: No  . Sexual activity: Not on file  Lifestyle  . Physical activity:    Days per week: Not on file    Minutes per session: Not on file  . Stress: Not on file  Relationships  . Social connections:    Talks on phone: Not on file    Gets together: Not on file    Attends religious service: Not on file    Active member of club or organization: Not on file  Attends meetings of clubs or organizations: Not on file    Relationship status: Not on file  . Intimate partner violence:    Fear of current or ex partner: Not on file    Emotionally abused: Not on file    Physically abused: Not on file    Forced sexual activity: Not on file  Other Topics Concern  . Not on file  Social History Narrative   Wrote poetry and  short stories.  Occ memoirs (has been published)      Has living will.    Requested DNR so form completed.    Requests daughter [name] as health care POA.    Would accept feeding tube unless vegetative state           Constitutional: Denies fever, malaise, fatigue, headache or abrupt weight changes.  HEENT: Denies eye pain, eye redness, ear pain, ringing in the ears, wax buildup, runny nose, nasal congestion, bloody nose, or sore throat. Respiratory: Denies difficulty breathing, shortness of breath, cough or sputum production.   Cardiovascular: Denies  chest pain, chest tightness, palpitations or swelling in the hands or feet.  Gastrointestinal: Denies abdominal pain, bloating, constipation, diarrhea or blood in the stool.  GU: Denies urgency, frequency, pain with urination, burning sensation, blood in urine, odor or discharge. Musculoskeletal: Denies decrease in range of motion, difficulty with gait, muscle pain or joint pain and swelling.  Skin: Denies redness, rashes, lesions or ulcercations.  Neurological: Denies dizziness, difficulty with memory, difficulty with speech or problems with balance and coordination.  Psych: Denies anxiety, depression, SI/HI.  No other specific complaints in a complete review of systems (except as listed in HPI above).  Objective:   Physical Exam   BP (!) 144/76   Pulse 74   Resp 20   Wt 183 lb 11.2 oz (83.3 kg)   BMI 28.99 kg/m  Wt Readings from Last 3 Encounters:  07/04/18 183 lb 11.2 oz (83.3 kg)  05/14/18 185 lb 3.2 oz (84 kg)  02/20/18 181 lb (82.1 kg)    General: Appears his stated age, well developed, well nourished in NAD. Cardiovascular: Normal rate and rhythm. S1,S2 noted.  Murmur noted. No edema. Pulmonary/Chest: Normal effort and positive vesicular breath sounds. No respiratory distress. No wheezes, rales or ronchi noted.  Abdomen: Soft and nontender. Normal bowel sounds.  Musculoskeletal:  No difficulty with gait.  Neurological: Alert and oriented. HOH Psychiatric: Mood and affect normal.   BMET    Component Value Date/Time   NA 140 05/29/2016 1100   NA 132 (L) 07/11/2012 0425   K 4.1 05/29/2016 1100   K 3.9 07/11/2012 0425   CL 105 05/29/2016 1100   CL 99 07/11/2012 0425   CO2 30 05/29/2016 1100   CO2 25 07/11/2012 0425   GLUCOSE 91 05/29/2016 1100   GLUCOSE 100 (H) 07/11/2012 0425   BUN 20 05/29/2016 1100   BUN 16 07/11/2012 0425   CREATININE 1.06 05/29/2016 1100   CREATININE 0.86 07/11/2012 0425   CALCIUM 9.1 05/29/2016 1100   CALCIUM 8.3 (L) 07/11/2012 0425    GFRNONAA >60 07/11/2012 0425   GFRAA >60 07/11/2012 0425    Lipid Panel  No results found for: CHOL, TRIG, HDL, CHOLHDL, VLDL, LDLCALC  CBC    Component Value Date/Time   WBC 6.0 05/29/2016 1100   RBC 4.54 05/29/2016 1100   HGB 14.7 05/29/2016 1100   HGB 12.3 (L) 07/11/2012 0425   HCT 43.3 05/29/2016 1100   HCT 40.6 06/16/2012 0910  PLT 222.0 05/29/2016 1100   PLT 152 07/11/2012 0425   MCV 95.4 05/29/2016 1100   MCV 96 06/16/2012 0910   MCH 33.2 06/16/2012 0910   MCHC 34.0 05/29/2016 1100   RDW 14.2 05/29/2016 1100   RDW 13.6 06/16/2012 0910   LYMPHSABS 1.7 05/29/2016 1100   MONOABS 0.8 05/29/2016 1100   EOSABS 0.2 05/29/2016 1100   BASOSABS 0.0 05/29/2016 1100    Hgb A1C No results found for: HGBA1C         Assessment & Plan:

## 2018-07-04 NOTE — Assessment & Plan Note (Signed)
Continue Namenda Appreciate ALF care 

## 2018-07-04 NOTE — Assessment & Plan Note (Signed)
Continue Amlodipine and HCTZ Will monitor 

## 2018-09-04 ENCOUNTER — Ambulatory Visit: Payer: Medicare Other | Admitting: Internal Medicine

## 2018-09-04 ENCOUNTER — Encounter: Payer: Self-pay | Admitting: Internal Medicine

## 2018-09-04 VITALS — BP 142/78 | HR 62 | Temp 98.1°F | Resp 18 | Wt 189.0 lb

## 2018-09-04 DIAGNOSIS — G479 Sleep disorder, unspecified: Secondary | ICD-10-CM | POA: Insufficient documentation

## 2018-09-04 DIAGNOSIS — I10 Essential (primary) hypertension: Secondary | ICD-10-CM | POA: Diagnosis not present

## 2018-09-04 DIAGNOSIS — I350 Nonrheumatic aortic (valve) stenosis: Secondary | ICD-10-CM

## 2018-09-04 DIAGNOSIS — F0150 Vascular dementia without behavioral disturbance: Secondary | ICD-10-CM

## 2018-09-04 NOTE — Assessment & Plan Note (Signed)
No chest pain or dizziness No action needed

## 2018-09-04 NOTE — Assessment & Plan Note (Addendum)
BP Readings from Last 3 Encounters:  09/04/18 (!) 142/78  07/04/18 (!) 144/76  05/14/18 (!) 178/83   Mild elevations are fine ---especially given the aortic stenosis (don't want too much afterload reduction)

## 2018-09-04 NOTE — Progress Notes (Signed)
Subjective:    Patient ID: Michael Kane, male    DOB: 1929-03-10, 85 y.o.   MRN: [id number]  HPI Visit in assisted living apartment for review of chronic health conditions Reviewed status with Luellen Pucker RN  He feels well---no new concerns  He feels he has done okay with the social distancing "I cheat"---hard to stay 6 feet apart at meals Otherwise has done okay No depression or anxiety  Sleeps well He is not sure whether he needs the medication now--discussed  No chest pain or SOB No racing or palpitations No dizziness or syncope No edema  Current Outpatient Medications on File Prior to Visit  Medication Sig Dispense Refill  . amLODipine (NORVASC) 10 MG tablet TAKE 1 TABLET BY MOUTH EVERY DAY 90 tablet 3  . hydrochlorothiazide (HYDRODIURIL) 25 MG tablet Take 1 tablet (25 mg total) by mouth daily. 90 tablet 3  . memantine (NAMENDA) 5 MG tablet Take 5 mg by mouth 2 (two) times daily.    . Multiple Vitamins-Minerals (MENS MULTI VITAMIN & MINERAL PO) Take by mouth daily.      . traZODone (DESYREL) 100 MG tablet TAKE 2 TABLETS (200 MG TOTAL) BY MOUTH AT BEDTIME. 180 tablet 1   No current facility-administered medications on file prior to visit.     Allergies  Allergen Reactions  . Lisinopril     REACTION: cough    Past Medical History:  Diagnosis Date  . History of prostate cancer    RT 2006  . Hx of colonic polyps   . Hypertension   . Mild cognitive impairment   . OSA (obstructive sleep apnea)    on CPAP of 10  . Rosacea   . Tremor, essential   . Wolff-Parkinson-White syndrome 1970    Past Surgical History:  Procedure Laterality Date  . TONSILLECTOMY    . TOTAL KNEE ARTHROPLASTY Left 3/14    Family History  Problem Relation Age of Onset  . Stroke Brother     Social History   Socioeconomic History  . Marital status: Widowed    Spouse name: Not on file  . Number of children: 3  . Years of education: Not on file  . Highest education level: Not on file   Occupational History  . 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 Financial resource strain: Not on file  . Food insecurity:    Worry: Not on file    Inability: Not on file  . Transportation needs:    Medical: Not on file    Non-medical: Not on file  Tobacco Use  . Smoking status: Former Smoker    Types: Cigarettes    Last attempt to quit: 04/23/1958    Years since quitting: 60.4  . Smokeless tobacco: Never Used  Substance and Sexual Activity  . Alcohol use: Yes    Alcohol/week: 0.0 standard drinks    Comment: 1 beer daily  . Drug use: No  . Sexual activity: Not on file  Lifestyle  . Physical activity:    Days per week: Not on file    Minutes per session: Not on file  . Stress: Not on file  Relationships  . Social connections:    Talks on phone: Not on file    Gets together: Not on file    Attends religious service: Not on file    Active member of club or organization: Not on file    Attends meetings of clubs or organizations:  Not on file    Relationship status: Not on file  . Intimate partner violence:    Fear of current or ex partner: Not on file    Emotionally abused: Not on file    Physically abused: Not on file    Forced sexual activity: Not on file  Other Topics Concern  . Not on file  Social History Narrative   Wrote poetry and  short stories.  Occ memoirs (has been published)      Has living will.    Requested DNR so form completed.    Requests daughter [name] as health care POA.    Would accept feeding tube unless vegetative state         Review of Systems No headaches Appetite is fine Weight is stable Bowels are regular ---will occasionally gets some urgency (especially after breakfast Voids okay--empties okay. Nocturia x 1 only.no incontinence      Objective:   Physical Exam  Constitutional: He appears well-developed. No distress.  Cardiovascular: Normal rate and regular rhythm. Exam reveals no gallop.   Gr 3/6 coarse systolic murmur  Respiratory: Effort normal and breath sounds normal. No respiratory distress. He has no wheezes. He has no rales.  GI: Soft. There is no abdominal tenderness.  Musculoskeletal:        General: No edema.  Skin: No rash noted.  Psychiatric: He has a normal mood and affect. His behavior is normal.           Assessment & Plan:

## 2018-09-04 NOTE — Assessment & Plan Note (Signed)
Seems to be sleeping okay Will try weaning the trazodone

## 2018-09-04 NOTE — Assessment & Plan Note (Signed)
Stable functional status Has done well here  Understands about the pandemic and has adjusted

## 2018-09-11 DIAGNOSIS — I10 Essential (primary) hypertension: Secondary | ICD-10-CM | POA: Diagnosis not present

## 2018-09-11 LAB — BASIC METABOLIC PANEL: Creatinine: 1.2 (ref 0.6–1.3)

## 2018-11-21 ENCOUNTER — Other Ambulatory Visit: Payer: Self-pay

## 2018-12-17 ENCOUNTER — Ambulatory Visit: Payer: Medicare Other | Admitting: Internal Medicine

## 2018-12-17 ENCOUNTER — Other Ambulatory Visit: Payer: Self-pay

## 2018-12-17 DIAGNOSIS — G4733 Obstructive sleep apnea (adult) (pediatric): Secondary | ICD-10-CM | POA: Diagnosis not present

## 2018-12-17 DIAGNOSIS — G479 Sleep disorder, unspecified: Secondary | ICD-10-CM

## 2018-12-17 DIAGNOSIS — F0150 Vascular dementia without behavioral disturbance: Secondary | ICD-10-CM

## 2018-12-17 DIAGNOSIS — I456 Pre-excitation syndrome: Secondary | ICD-10-CM | POA: Diagnosis not present

## 2018-12-17 DIAGNOSIS — I10 Essential (primary) hypertension: Secondary | ICD-10-CM | POA: Diagnosis not present

## 2018-12-24 ENCOUNTER — Encounter: Payer: Self-pay | Admitting: Internal Medicine

## 2018-12-24 NOTE — Assessment & Plan Note (Signed)
Continue Trazadone 

## 2018-12-24 NOTE — Assessment & Plan Note (Signed)
Asymptomatic  Monitor

## 2018-12-24 NOTE — Progress Notes (Signed)
Subjective:    Patient ID: Michael Kane, male    DOB: Aug 27, 1928, 85 y.o.   MRN: [id number]  HPI  Routine follow up for resident in [address] No new concerns from staff or resident Continues to do well Sleeping fine, independent with ADL's. Walks without device. Appetite good, weight stable. No issues with bowels or bladder. Denies pain, reflux or SOB.  WPW: Asymptomatic. Controlled off meds.  OSA/Insomnia: Sleeping well with Trazadone. He no longer uses  his CPAP.  HTN: Controlled on Amlodipine and HCTZ.  MCI: Mild cognitive issues, confusion. Stable on Namenda.  Review of Systems      Past Medical History:  Diagnosis Date  . History of prostate cancer    RT 2006  . Hx of colonic polyps   . Hypertension   . Mild cognitive impairment   . OSA (obstructive sleep apnea)    on CPAP of 10  . Rosacea   . Tremor, essential   . Wolff-Parkinson-White syndrome 1970    Current Outpatient Medications  Medication Sig Dispense Refill  . amLODipine (NORVASC) 10 MG tablet TAKE 1 TABLET BY MOUTH EVERY DAY 90 tablet 3  . hydrochlorothiazide (HYDRODIURIL) 25 MG tablet Take 1 tablet (25 mg total) by mouth daily. 90 tablet 3  . memantine (NAMENDA) 5 MG tablet Take 5 mg by mouth 2 (two) times daily.    . Multiple Vitamins-Minerals (MENS MULTI VITAMIN & MINERAL PO) Take by mouth daily.      . traZODone (DESYREL) 50 MG tablet Take 50 mg by mouth at bedtime.     No current facility-administered medications for this visit.     Allergies  Allergen Reactions  . Lisinopril     REACTION: cough    Family History  Problem Relation Age of Onset  . Stroke Brother     Social History   Socioeconomic History  . Marital status: Widowed    Spouse name: Not on file  . Number of children: 3  . Years of education: Not on file  . Highest education level: Not on file  Occupational History  . 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 Financial  resource strain: Not on file  . Food insecurity    Worry: Not on file    Inability: Not on file  . Transportation needs    Medical: Not on file    Non-medical: Not on file  Tobacco Use  . Smoking status: Former Smoker    Types: Cigarettes    Quit date: 04/23/1958    Years since quitting: 60.7  . Smokeless tobacco: Never Used  Substance and Sexual Activity  . Alcohol use: Yes    Alcohol/week: 0.0 standard drinks    Comment: 1 beer daily  . Drug use: No  . Sexual activity: Not on file  Lifestyle  . Physical activity    Days per week: Not on file    Minutes per session: Not on file  . Stress: Not on file  Relationships  . Social Herbalist on phone: Not on file    Gets together: Not on file    Attends religious service: Not on file    Active member of club or organization: Not on file    Attends meetings of clubs or organizations: Not on file    Relationship status: Not on file  . Intimate partner violence    Fear of current or ex partner: Not on file  Emotionally abused: Not on file    Physically abused: Not on file    Forced sexual activity: Not on file  Other Topics Concern  . Not on file  Social History Narrative   Wrote poetry and  short stories.  Occ memoirs (has been published)      Has living will.    Requested DNR so form completed.    Requests daughter [name] as health care POA.    Would accept feeding tube unless vegetative state           Constitutional: Denies fever, malaise, fatigue, headache or abrupt weight changes.  HEENT: Denies eye pain, eye redness, ear pain, ringing in the ears, wax buildup, runny nose, nasal congestion, bloody nose, or sore throat. Respiratory: Denies difficulty breathing, shortness of breath, cough or sputum production.   Cardiovascular: Denies chest pain, chest tightness, palpitations or swelling in the hands or feet.  Gastrointestinal: Denies abdominal pain, bloating, constipation, diarrhea or blood in the stool.   GU: Denies urgency, frequency, pain with urination, burning sensation, blood in urine, odor or discharge. Musculoskeletal: Denies decrease in range of motion, difficulty with gait, muscle pain or joint pain and swelling.  Skin: Denies redness, rashes, lesions or ulcercations.  Neurological: Pt reports difficulty with memory. Denies dizziness,  difficulty with speech or problems with balance and coordination.  Psych: Denies anxiety, depression, SI/HI.  No other specific complaints in a complete review of systems (except as listed in HPI above).  Objective:   Physical Exam  BP (!) 145/83   Pulse 62   Temp 97.7 F (36.5 C)   Resp 16   Wt 189 lb 6.4 oz (85.9 kg)   BMI 29.89 kg/m  Wt Readings from Last 3 Encounters:  12/24/18 189 lb 6.4 oz (85.9 kg)  09/04/18 189 lb (85.7 kg)  07/04/18 183 lb 11.2 oz (83.3 kg)    General: Appears his stated age, well developed, well nourished in NAD. Cardiovascular: Normal rate and rhythm. S1,S2 noted.  Murmur noted. No JVD or BLE edema.  Pulmonary/Chest: Normal effort and positive vesicular breath sounds. No respiratory distress. No wheezes, rales or ronchi noted.  Abdomen: Soft and nontender. Normal bowel sounds. No distention or masses noted.  Musculoskeletal: . No difficulty with gait.  Neurological: Alert and oriented to person and place. Psychiatric: Mood and affect normal.   BMET    Component Value Date/Time   NA 140 05/29/2016 1100   NA 132 (L) 07/11/2012 0425   K 4.1 05/29/2016 1100   K 3.9 07/11/2012 0425   CL 105 05/29/2016 1100   CL 99 07/11/2012 0425   CO2 30 05/29/2016 1100   CO2 25 07/11/2012 0425   GLUCOSE 91 05/29/2016 1100   GLUCOSE 100 (H) 07/11/2012 0425   BUN 20 05/29/2016 1100   BUN 16 07/11/2012 0425   CREATININE 1.06 05/29/2016 1100   CREATININE 0.86 07/11/2012 0425   CALCIUM 9.1 05/29/2016 1100   CALCIUM 8.3 (L) 07/11/2012 0425   GFRNONAA >60 07/11/2012 0425   GFRAA >60 07/11/2012 0425    Lipid Panel  No  results found for: CHOL, TRIG, HDL, CHOLHDL, VLDL, LDLCALC  CBC    Component Value Date/Time   WBC 6.0 05/29/2016 1100   RBC 4.54 05/29/2016 1100   HGB 14.7 05/29/2016 1100   HGB 12.3 (L) 07/11/2012 0425   HCT 43.3 05/29/2016 1100   HCT 40.6 06/16/2012 0910   PLT 222.0 05/29/2016 1100   PLT 152 07/11/2012 0425   MCV 95.4  05/29/2016 1100   MCV 96 06/16/2012 0910   MCH 33.2 06/16/2012 0910   MCHC 34.0 05/29/2016 1100   RDW 14.2 05/29/2016 1100   RDW 13.6 06/16/2012 0910   LYMPHSABS 1.7 05/29/2016 1100   MONOABS 0.8 05/29/2016 1100   EOSABS 0.2 05/29/2016 1100   BASOSABS 0.0 05/29/2016 1100    Hgb A1C No results found for: HGBA1C         Assessment & Plan:

## 2018-12-24 NOTE — Assessment & Plan Note (Signed)
Continue Amlodipine and HCTZ Monitor

## 2018-12-24 NOTE — Assessment & Plan Note (Signed)
Stable on Namenda Appreciated ALF care

## 2018-12-24 NOTE — Assessment & Plan Note (Signed)
Stable off CPAP Monitor

## 2018-12-26 ENCOUNTER — Encounter: Payer: Self-pay | Admitting: Internal Medicine

## 2019-04-16 ENCOUNTER — Encounter: Payer: Self-pay | Admitting: Internal Medicine

## 2019-04-16 ENCOUNTER — Other Ambulatory Visit: Payer: Self-pay

## 2019-04-16 ENCOUNTER — Ambulatory Visit: Payer: Medicare Other | Admitting: Internal Medicine

## 2019-04-16 VITALS — BP 148/78 | HR 60 | Temp 97.9°F | Resp 18 | Wt 180.5 lb

## 2019-04-16 DIAGNOSIS — I350 Nonrheumatic aortic (valve) stenosis: Secondary | ICD-10-CM

## 2019-04-16 DIAGNOSIS — I456 Pre-excitation syndrome: Secondary | ICD-10-CM

## 2019-04-16 DIAGNOSIS — N1832 Chronic kidney disease, stage 3b: Secondary | ICD-10-CM | POA: Insufficient documentation

## 2019-04-16 DIAGNOSIS — I10 Essential (primary) hypertension: Secondary | ICD-10-CM | POA: Diagnosis not present

## 2019-04-16 DIAGNOSIS — G479 Sleep disorder, unspecified: Secondary | ICD-10-CM

## 2019-04-16 DIAGNOSIS — F0150 Vascular dementia without behavioral disturbance: Secondary | ICD-10-CM

## 2019-04-16 NOTE — Assessment & Plan Note (Signed)
Sleeps well on the trazodone

## 2019-04-16 NOTE — Assessment & Plan Note (Signed)
No symptoms of tachycardia

## 2019-04-16 NOTE — Assessment & Plan Note (Signed)
Will monitor No action due to age/relative stability

## 2019-04-16 NOTE — Assessment & Plan Note (Signed)
BP Readings from Last 3 Encounters:  04/16/19 (!) 148/78  12/24/18 (!) 145/83  09/04/18 (!) 142/78   Reasonable control given his AS as well No changes Labs okay

## 2019-04-16 NOTE — Progress Notes (Signed)
Subjective:    Patient ID: Michael Kane, male    DOB: 1928-11-01, 85 y.o.   MRN: [id number]  [facility] for review of chronic health conditions Reviewed status with Luellen Pucker RN  Did have COVID infection---but no symptoms Back in apartment for quite some time He feels well  Continues to do well in the supported AL setting Independent with ADLs and remains continent No problem getting down for meals Walks without aid  Stable mild confusion  No chest apin No SOB No palpitations No dizziness or sycnope No edema  Voids okay Nocturia x 1--sometimes 2 He feels the stream is fine  [place] Adjusted to the quarantine--but looks forward to more freedom  Reviewed labs GFR 42  Current Outpatient Medications on File Prior to Visit  Medication Sig Dispense Refill  . amLODipine (NORVASC) 10 MG tablet TAKE 1 TABLET BY MOUTH EVERY DAY 90 tablet 3  . hydrochlorothiazide (HYDRODIURIL) 25 MG tablet Take 1 tablet (25 mg total) by mouth daily. 90 tablet 3  . memantine (NAMENDA) 5 MG tablet Take 5 mg by mouth 2 (two) times daily.    . Multiple Vitamins-Minerals (MENS MULTI VITAMIN & MINERAL PO) Take by mouth daily.      . traZODone (DESYREL) 50 MG tablet Take 50 mg by mouth at bedtime.     No current facility-administered medications on file prior to visit.    Allergies  Allergen Reactions  . Lisinopril     REACTION: cough    Past Medical History:  Diagnosis Date  . History of prostate cancer    RT 2006  . Hx of colonic polyps   . Hypertension   . Mild cognitive impairment   . OSA (obstructive sleep apnea)    on CPAP of 10  . Rosacea   . Tremor, essential   . Wolff-Parkinson-White syndrome 1970    Past Surgical History:  Procedure Laterality Date  . TONSILLECTOMY    . TOTAL KNEE ARTHROPLASTY Left 3/14    Family History  Problem Relation Age of Onset  . Stroke Brother     Social History   Socioeconomic  History  . Marital status: Widowed    Spouse name: Not on file  . Number of children: 3  . Years of education: Not on file  . Highest education level: Not on file  Occupational History  . Occupation: retired Research scientist (physical sciences): retired  Tobacco Use  . Smoking status: Former Smoker    Types: Cigarettes    Quit date: 04/23/1958    Years since quitting: 61.0  . Smokeless tobacco: Never Used  Substance and Sexual Activity  . Alcohol use: Yes    Alcohol/week: 0.0 standard drinks    Comment: 1 beer daily  . Drug use: No  . Sexual activity: Not on file  Other Topics Concern  . Not on file  Social History Narrative   Wrote poetry and  short stories.  Occ memoirs (has been published)      Has living will.    Requested DNR so form completed.    Requests daughter [name] as health care POA.    Would accept feeding tube unless vegetative state         Social Determinants of Health   Financial Resource Strain:   . Difficulty of Paying Living Expenses: Not on file  Food Insecurity:   . Worried About Charity fundraiser in  the Last Year: Not on file  . Ran Out of Food in the Last Year: Not on file  Transportation Needs:   . Lack of Transportation (Medical): Not on file  . Lack of Transportation (Non-Medical): Not on file  Physical Activity:   . Days of Exercise per Week: Not on file  . Minutes of Exercise per Session: Not on file  Stress:   . Feeling of Stress : Not on file  Social Connections:   . Frequency of Communication with Friends and Family: Not on file  . Frequency of Social Gatherings with Friends and Family: Not on file  . Attends Religious Services: Not on file  . Active Member of Clubs or Organizations: Not on file  . Attends Archivist Meetings: Not on file  . Marital Status: Not on file  Intimate Partner Violence:   . Fear of Current or Ex-Partner: Not on file  . Emotionally Abused: Not on file  . Physically Abused: Not on file  .  Sexually Abused: Not on file   Review of Systems Appetite is good Weight down a little Sleeping well No headaches Bowels are fine    Objective:   Physical Exam  Constitutional: He appears well-developed. No distress.  Neck: No thyromegaly present.  Cardiovascular: Normal rate, regular rhythm and intact distal pulses. Exam reveals no gallop.  Gr 3/6 aortic systolic murmur  Respiratory: Effort normal and breath sounds normal. No respiratory distress. He has no wheezes. He has no rales.  GI: Soft.  Lymphadenopathy:    He has no cervical adenopathy.  Neurological:  Repeats himself (same questions) but fairly normal interaction  Psychiatric: He has a normal mood and affect. His behavior is normal.           Assessment & Plan:

## 2019-04-16 NOTE — Assessment & Plan Note (Signed)
Stable and doing well in AL On memantine

## 2019-04-16 NOTE — Assessment & Plan Note (Signed)
No dizziness or chest pain Mild findings on echol 2014

## 2019-05-04 DIAGNOSIS — Z23 Encounter for immunization: Secondary | ICD-10-CM | POA: Diagnosis not present

## 2019-06-01 DIAGNOSIS — Z23 Encounter for immunization: Secondary | ICD-10-CM | POA: Diagnosis not present

## 2019-06-24 ENCOUNTER — Ambulatory Visit: Payer: Medicare Other | Admitting: Internal Medicine

## 2019-06-24 ENCOUNTER — Other Ambulatory Visit: Payer: Self-pay

## 2019-06-24 DIAGNOSIS — I10 Essential (primary) hypertension: Secondary | ICD-10-CM | POA: Diagnosis not present

## 2019-06-24 DIAGNOSIS — F0150 Vascular dementia without behavioral disturbance: Secondary | ICD-10-CM | POA: Diagnosis not present

## 2019-06-24 DIAGNOSIS — G479 Sleep disorder, unspecified: Secondary | ICD-10-CM

## 2019-06-26 ENCOUNTER — Encounter: Payer: Self-pay | Admitting: Internal Medicine

## 2019-06-26 NOTE — Patient Instructions (Signed)
Erectile Dysfunction Erectile dysfunction (ED) is the inability to get or keep an erection in order to have sexual intercourse. Erectile dysfunction may include:  Inability to get an erection.  Lack of enough hardness of the erection to allow penetration.  Loss of the erection before sex is finished. What are the causes? This condition may be caused by:  Certain medicines, such as: ? Pain relievers. ? Antihistamines. ? Antidepressants. ? Blood pressure medicines. ? Water pills (diuretics). ? Ulcer medicines. ? Muscle relaxants. ? Drugs.  Excessive drinking.  Psychological causes, such as: ? Anxiety. ? Depression. ? Sadness. ? Exhaustion. ? Performance fear. ? Stress.  Physical causes, such as: ? Artery problems. This may include diabetes, smoking, liver disease, or atherosclerosis. ? High blood pressure. ? Hormonal problems, such as low testosterone. ? Obesity. ? Nerve problems. This may include back or pelvic injuries, diabetes mellitus, multiple sclerosis, or Parkinson disease. What are the signs or symptoms? Symptoms of this condition include:  Inability to get an erection.  Lack of enough hardness of the erection to allow penetration.  Loss of the erection before sex is finished.  Normal erections at some times, but with frequent unsatisfactory episodes.  Low sexual satisfaction in either partner due to erection problems.  A curved penis occurring with erection. The curve may cause pain or the penis may be too curved to allow for intercourse.  Never having nighttime erections. How is this diagnosed? This condition is often diagnosed by:  Performing a physical exam to find other diseases or specific problems with the penis.  Asking you detailed questions about the problem.  Performing blood tests to check for diabetes mellitus or to measure hormone levels.  Performing other tests to check for underlying health conditions.  Performing an ultrasound  exam to check for scarring.  Performing a test to check blood flow to the penis.  Doing a sleep study at home to measure nighttime erections. How is this treated? This condition may be treated by:  Medicine taken by mouth to help you achieve an erection (oral medicine).  Hormone replacement therapy to replace low testosterone levels.  Medicine that is injected into the penis. Your health care provider may instruct you how to give yourself these injections at home.  Vacuum pump. This is a pump with a ring on it. The pump and ring are placed on the penis and used to create pressure that helps the penis become erect.  Penile implant surgery. In this procedure, you may receive: ? An inflatable implant. This consists of cylinders, a pump, and a reservoir. The cylinders can be inflated with a fluid that helps to create an erection, and they can be deflated after intercourse. ? A semi-rigid implant. This consists of two silicone rubber rods. The rods provide some rigidity. They are also flexible, so the penis can both curve downward in its normal position and become straight for sexual intercourse.  Blood vessel surgery, to improve blood flow to the penis. During this procedure, a blood vessel from a different part of the body is placed into the penis to allow blood to flow around (bypass) damaged or blocked blood vessels.  Lifestyle changes, such as exercising more, losing weight, and quitting smoking. Follow these instructions at home: Medicines   Take over-the-counter and prescription medicines only as told by your health care provider. Do not increase the dosage without first discussing it with your health care provider.  If you are using self-injections, perform injections as directed by your   health care provider. Make sure to avoid any veins that are on the surface of the penis. After giving an injection, apply pressure to the injection site for 5 minutes. General  instructions  Exercise regularly, as directed by your health care provider. Work with your health care provider to lose weight, if needed.  Do not use any products that contain nicotine or tobacco, such as cigarettes and e-cigarettes. If you need help quitting, ask your health care provider.  Before using a vacuum pump, read the instructions that come with the pump and discuss any questions with your health care provider.  Keep all follow-up visits as told by your health care provider. This is important. Contact a health care provider if:  You feel nauseous.  You vomit. Get help right away if:  You are taking oral or injectable medicines and you have an erection that lasts longer than 4 hours. If your health care provider is unavailable, go to the nearest emergency room for evaluation. An erection that lasts much longer than 4 hours can result in permanent damage to your penis.  You have severe pain in your groin or abdomen.  You develop redness or severe swelling of your penis.  You have redness spreading up into your groin or lower abdomen.  You are unable to urinate.  You experience chest pain or a rapid heart beat (palpitations) after taking oral medicines. Summary  Erectile dysfunction (ED) is the inability to get or keep an erection during sexual intercourse. This problem can usually be treated successfully.  This condition is diagnosed based on a physical exam, your symptoms, and tests to determine the cause. Treatment varies depending on the cause, and may include medicines, hormone therapy, surgery, or vacuum pump.  You may need follow-up visits to make sure that you are using your medicines or devices correctly.  Get help right away if you are taking or injecting medicines and you have an erection that lasts longer than 4 hours. This information is not intended to replace advice given to you by your health care provider. Make sure you discuss any questions you have with  your health care provider. Document Revised: 03/22/2017 Document Reviewed: 04/25/2016 Elsevier Patient Education  2020 [employer]  

## 2019-06-26 NOTE — Assessment & Plan Note (Signed)
Continue Trazadone

## 2019-06-26 NOTE — Assessment & Plan Note (Signed)
Controlled on Amlodipine and HCTZ Will monitor

## 2019-06-26 NOTE — Assessment & Plan Note (Signed)
Mild cognitive needs Appreciate ALF care Continue Namenda

## 2019-06-26 NOTE — Progress Notes (Signed)
Subjective:    Patient ID: Michael Kane, male    DOB: 1928/11/08, 86 y.o.   MRN: [id number]  HPI  Resident seen in ALF apt for routine visit No new concerns from staff. Resident is requesting medication to help him with an erection. He is wanting to be intimate with another resident.  Otherwise he is doing about the same. Sleeps well. Walks without device. His appetite is good, weight is stable. His bowels are good. He denies chest pain, SOB or pain.  HTN: He is taking Amlodipine and HCTZ as prescribed.  Dementia: Mild cogniitve, stable functional needs. He is taking Namenda as prescribed.  Insomnia: Controlled on Trazadone.  Review of Systems      Past Medical History:  Diagnosis Date  . History of prostate cancer    RT 2006  . Hx of colonic polyps   . Hypertension   . Mild cognitive impairment   . OSA (obstructive sleep apnea)    on CPAP of 10  . Rosacea   . Tremor, essential   . Wolff-Parkinson-White syndrome 1970    Current Outpatient Medications  Medication Sig Dispense Refill  . amLODipine (NORVASC) 10 MG tablet TAKE 1 TABLET BY MOUTH EVERY DAY 90 tablet 3  . hydrochlorothiazide (HYDRODIURIL) 25 MG tablet Take 1 tablet (25 mg total) by mouth daily. 90 tablet 3  . memantine (NAMENDA) 5 MG tablet Take 5 mg by mouth 2 (two) times daily.    . Multiple Vitamins-Minerals (MENS MULTI VITAMIN & MINERAL PO) Take by mouth daily.      . traZODone (DESYREL) 50 MG tablet Take 50 mg by mouth at bedtime.     No current facility-administered medications for this visit.    Allergies  Allergen Reactions  . Lisinopril     REACTION: cough    Family History  Problem Relation Age of Onset  . Stroke Brother     Social History   Socioeconomic History  . Marital status: Widowed    Spouse name: Not on file  . Number of children: 3  . Years of education: Not on file  . Highest education level: Not on file  Occupational History  . Occupation: retired Educational psychologist: retired  Tobacco Use  . Smoking status: Former Smoker    Types: Cigarettes    Quit date: 04/23/1958    Years since quitting: 61.2  . Smokeless tobacco: Never Used  Substance and Sexual Activity  . Alcohol use: Yes    Alcohol/week: 0.0 standard drinks    Comment: 1 beer daily  . Drug use: No  . Sexual activity: Not on file  Other Topics Concern  . Not on file  Social History Narrative   Wrote poetry and  short stories.  Occ memoirs (has been published)      Has living will.    Requested DNR so form completed.    Requests daughter [name] as health care POA.    Would accept feeding tube unless vegetative state         Social Determinants of Health   Financial Resource Strain:   . Difficulty of Paying Living Expenses: Not on file  Food Insecurity:   . Worried About Charity fundraiser in the Last Year: Not on file  . Ran Out of Food in the Last Year: Not on file  Transportation Needs:   . Lack of Transportation (Medical): Not on file  . Lack of Transportation (Non-Medical): Not on file  Physical Activity:   . Days of Exercise per Week: Not on file  . Minutes of Exercise per Session: Not on file  Stress:   . Feeling of Stress : Not on file  Social Connections:   . Frequency of Communication with Friends and Family: Not on file  . Frequency of Social Gatherings with Friends and Family: Not on file  . Attends Religious Services: Not on file  . Active Member of Clubs or Organizations: Not on file  . Attends Archivist Meetings: Not on file  . Marital Status: Not on file  Intimate Partner Violence:   . Fear of Current or Ex-Partner: Not on file  . Emotionally Abused: Not on file  . Physically Abused: Not on file  . Sexually Abused: Not on file     Constitutional: Denies fever, malaise, fatigue, headache or abrupt weight changes.  HEENT: Denies eye pain, eye redness, ear pain, ringing in the ears, wax buildup, runny nose, nasal congestion,  bloody nose, or sore throat. Respiratory: Denies difficulty breathing, shortness of breath, cough or sputum production.   Cardiovascular: Denies chest pain, chest tightness, palpitations or swelling in the hands or feet.  Gastrointestinal: Denies abdominal pain, bloating, constipation, diarrhea or blood in the stool.  GU: Pt reports erectile dysfunction. Denies urgency, frequency, pain with urination, burning sensation, blood in urine, odor or discharge. Musculoskeletal: Denies decrease in range of motion, difficulty with gait, muscle pain or joint pain and swelling.  Skin: Denies redness, rashes, lesions or ulcercations.  Neurological: Pt reports difficulty with memory. Denies dizziness, difficulty with speech or problems with balance and coordination.  Psych: Denies anxiety, depression, SI/HI.  No other specific complaints in a complete review of systems (except as listed in HPI above).  Objective:   Physical Exam  BP 130/68   Pulse 70  Wt Readings from Last 3 Encounters:  04/16/19 180 lb 8 oz (81.9 kg)  12/24/18 189 lb 6.4 oz (85.9 kg)  09/04/18 189 lb (85.7 kg)    General: Appears his stated age, well developed, well nourished in NAD. Cardiovascular: Normal rate and rhythm. S1,S2 noted.  Murmur noted. No JVD or BLE edema.  Pulmonary/Chest: Normal effort and positive vesicular breath sounds. No respiratory distress. No wheezes, rales or ronchi noted.  Abdomen: Soft and nontender. Normal bowel sounds. No distention or masses noted.  Neurological: Alert and oriented. Repeats himself often.   BMET    Component Value Date/Time   NA 140 05/29/2016 1100   NA 132 (L) 07/11/2012 0425   K 4.1 05/29/2016 1100   K 3.9 07/11/2012 0425   CL 105 05/29/2016 1100   CL 99 07/11/2012 0425   CO2 30 05/29/2016 1100   CO2 25 07/11/2012 0425   GLUCOSE 91 05/29/2016 1100   GLUCOSE 100 (H) 07/11/2012 0425   BUN 20 05/29/2016 1100   BUN 16 07/11/2012 0425   CREATININE 1.2 09/11/2018 0000    CREATININE 1.06 05/29/2016 1100   CREATININE 0.86 07/11/2012 0425   CALCIUM 9.1 05/29/2016 1100   CALCIUM 8.3 (L) 07/11/2012 0425   GFRNONAA >60 07/11/2012 0425   GFRAA >60 07/11/2012 0425    Lipid Panel  No results found for: CHOL, TRIG, HDL, CHOLHDL, VLDL, LDLCALC  CBC    Component Value Date/Time   WBC 6.0 05/29/2016 1100   RBC 4.54 05/29/2016 1100   HGB 14.7 05/29/2016 1100   HGB 12.3 (L) 07/11/2012 0425   HCT 43.3 05/29/2016 1100   HCT 40.6 06/16/2012 0910  PLT 222.0 05/29/2016 1100   PLT 152 07/11/2012 0425   MCV 95.4 05/29/2016 1100   MCV 96 06/16/2012 0910   MCH 33.2 06/16/2012 0910   MCHC 34.0 05/29/2016 1100   RDW 14.2 05/29/2016 1100   RDW 13.6 06/16/2012 0910   LYMPHSABS 1.7 05/29/2016 1100   MONOABS 0.8 05/29/2016 1100   EOSABS 0.2 05/29/2016 1100   BASOSABS 0.0 05/29/2016 1100    Hgb A1C No results found for: HGBA1C      Assessment & Plan:

## 2019-09-10 ENCOUNTER — Encounter: Payer: Self-pay | Admitting: Internal Medicine

## 2019-09-10 DIAGNOSIS — I10 Essential (primary) hypertension: Secondary | ICD-10-CM | POA: Diagnosis not present

## 2019-10-15 ENCOUNTER — Encounter: Payer: Self-pay | Admitting: Internal Medicine

## 2019-10-15 ENCOUNTER — Ambulatory Visit: Payer: PRIVATE HEALTH INSURANCE | Admitting: Internal Medicine

## 2019-10-15 VITALS — BP 160/73 | HR 60 | Temp 98.3°F | Resp 16 | Wt 177.0 lb

## 2019-10-15 DIAGNOSIS — G479 Sleep disorder, unspecified: Secondary | ICD-10-CM

## 2019-10-15 DIAGNOSIS — I456 Pre-excitation syndrome: Secondary | ICD-10-CM | POA: Diagnosis not present

## 2019-10-15 DIAGNOSIS — I10 Essential (primary) hypertension: Secondary | ICD-10-CM | POA: Diagnosis not present

## 2019-10-15 DIAGNOSIS — I350 Nonrheumatic aortic (valve) stenosis: Secondary | ICD-10-CM

## 2019-10-15 DIAGNOSIS — N1831 Chronic kidney disease, stage 3a: Secondary | ICD-10-CM

## 2019-10-15 DIAGNOSIS — F0150 Vascular dementia without behavioral disturbance: Secondary | ICD-10-CM

## 2019-10-15 NOTE — Assessment & Plan Note (Signed)
No symptoms No further evaluation May be worth not overdoing afterload reduction

## 2019-10-15 NOTE — Assessment & Plan Note (Addendum)
BP Readings from Last 3 Encounters:  10/15/19 (!) 160/73  06/26/19 130/68  04/16/19 (!) 148/78   Generally well controlled No change in amlodipine or HCTZ Recent labs fine in May

## 2019-10-15 NOTE — Assessment & Plan Note (Signed)
Sleeping better Will change trazodone to prn

## 2019-10-15 NOTE — Assessment & Plan Note (Signed)
No apparent tachycardia

## 2019-10-15 NOTE — Progress Notes (Signed)
Subjective:    Patient ID: Michael Kane, male    DOB: 12/29/1928, 86 y.o.   MRN: [id number]  HPI Visit in his assisted living apartment for follow up of chronic health conditions Reviewed status with Luellen Pucker RN No apparent new issues per her  He feels well Continues to be independent Currently dating another resident [facility] without device Independent with bathroom and all ADLs [place]  No chest pain No palpitations No dizziness or syncope No edema  Voids okay Seems to empty okay  Sleeps well on the trazodone He is not sure he needs it  Last GFR 55  Current Outpatient Medications on File Prior to Visit  Medication Sig Dispense Refill  . amLODipine (NORVASC) 10 MG tablet TAKE 1 TABLET BY MOUTH EVERY DAY 90 tablet 3  . hydrochlorothiazide (HYDRODIURIL) 25 MG tablet Take 1 tablet (25 mg total) by mouth daily. 90 tablet 3  . memantine (NAMENDA) 5 MG tablet Take 5 mg by mouth 2 (two) times daily.    . Multiple Vitamins-Minerals (MENS MULTI VITAMIN & MINERAL PO) Take by mouth daily.      . traZODone (DESYREL) 50 MG tablet Take 50 mg by mouth at bedtime.     No current facility-administered medications on file prior to visit.    Allergies  Allergen Reactions  . Lisinopril     REACTION: cough    Past Medical History:  Diagnosis Date  . History of prostate cancer    RT 2006  . Hx of colonic polyps   . Hypertension   . Mild cognitive impairment   . OSA (obstructive sleep apnea)    on CPAP of 10  . Rosacea   . Tremor, essential   . Wolff-Parkinson-White syndrome 1970    Past Surgical History:  Procedure Laterality Date  . TONSILLECTOMY    . TOTAL KNEE ARTHROPLASTY Left 3/14    Family History  Problem Relation Age of Onset  . Stroke Brother     Social History   Socioeconomic History  . Marital status: Widowed    Spouse name: Not on file  . Number of children: 3  . Years of education: Not on file  . Highest education level:  Not on file  Occupational History  . Occupation: retired Research scientist (physical sciences): retired  Tobacco Use  . Smoking status: Former Smoker    Types: Cigarettes    Quit date: 04/23/1958    Years since quitting: 61.5  . Smokeless tobacco: Never Used  Substance and Sexual Activity  . Alcohol use: Yes    Alcohol/week: 0.0 standard drinks    Comment: 1 beer daily  . Drug use: No  . Sexual activity: Not on file  Other Topics Concern  . Not on file  Social History Narrative   Wrote poetry and  short stories.  Occ memoirs (has been published)      Has living will.    Requested DNR so form completed.    Requests daughter [name] as health care POA.    Would accept feeding tube unless vegetative state         Social Determinants of Health   Financial Resource Strain:   . Difficulty of Paying Living Expenses:   Food Insecurity:   . Worried About Charity fundraiser in the Last Year:   . Arboriculturist in the Last Year:   Transportation Needs:   . Film/video editor (Medical):   Marland Kitchen  Lack of Transportation (Non-Medical):   Physical Activity:   . Days of Exercise per Week:   . Minutes of Exercise per Session:   Stress:   . Feeling of Stress :   Social Connections:   . Frequency of Communication with Friends and Family:   . Frequency of Social Gatherings with Friends and Family:   . Attends Religious Services:   . Active Member of Clubs or Organizations:   . Attends Archivist Meetings:   Marland Kitchen Marital Status:   Intimate Partner Violence:   . Fear of Current or Ex-Partner:   . Emotionally Abused:   Marland Kitchen Physically Abused:   . Sexually Abused:    Review of Systems  Appetite is fine Weight is fairly stable Bowels move regularly No skin issues     Objective:   Physical Exam  Cardiovascular: Normal rate, regular rhythm and normal pulses. Exam reveals no gallop.  Gr 3/6 coarse systolic murmur---loudest aortic  Respiratory: Effort normal and breath sounds  normal. He has no wheezes. He has no rales.  GI: Soft. Normal appearance. There is no abdominal tenderness.  Musculoskeletal:     Cervical back: Neck supple.     Right lower leg: No edema.     Left lower leg: No edema.  Lymphadenopathy:    He has no cervical adenopathy.  Neurological: He is alert.  Skin: No rash noted.  Psychiatric: His behavior is normal. Mood normal.  Very positive           Assessment & Plan:

## 2019-10-15 NOTE — Assessment & Plan Note (Signed)
Will recheck labs No ACEI/ARB for now--given age and AS

## 2019-10-15 NOTE — Assessment & Plan Note (Signed)
Mild and stable Maintains functional independence in AL setting More upbeat now with new relationship

## 2019-12-31 ENCOUNTER — Other Ambulatory Visit: Payer: Self-pay

## 2019-12-31 ENCOUNTER — Ambulatory Visit: Payer: Medicare Other | Admitting: Internal Medicine

## 2019-12-31 ENCOUNTER — Encounter: Payer: Self-pay | Admitting: Internal Medicine

## 2019-12-31 DIAGNOSIS — F39 Unspecified mood [affective] disorder: Secondary | ICD-10-CM | POA: Diagnosis not present
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
## 2019-12-31 NOTE — Assessment & Plan Note (Signed)
Seems to have anger/mood lability Likely related to the dementia Will try low dose sertraline and monitor

## 2020-01-13 ENCOUNTER — Other Ambulatory Visit: Payer: Self-pay

## 2020-01-13 ENCOUNTER — Ambulatory Visit: Payer: Medicare Other | Admitting: Internal Medicine

## 2020-01-13 ENCOUNTER — Encounter: Payer: Self-pay | Admitting: Internal Medicine

## 2020-01-13 VITALS — BP 149/67 | HR 56 | Temp 97.7°F | Resp 17 | Wt 176.2 lb

## 2020-01-13 DIAGNOSIS — I350 Nonrheumatic aortic (valve) stenosis: Secondary | ICD-10-CM

## 2020-01-13 DIAGNOSIS — Z8546 Personal history of malignant neoplasm of prostate: Secondary | ICD-10-CM | POA: Diagnosis not present

## 2020-01-13 DIAGNOSIS — F0150 Vascular dementia without behavioral disturbance: Secondary | ICD-10-CM | POA: Diagnosis not present

## 2020-01-13 DIAGNOSIS — F39 Unspecified mood [affective] disorder: Secondary | ICD-10-CM

## 2020-01-13 DIAGNOSIS — G4733 Obstructive sleep apnea (adult) (pediatric): Secondary | ICD-10-CM

## 2020-01-13 DIAGNOSIS — N1831 Chronic kidney disease, stage 3a: Secondary | ICD-10-CM

## 2020-01-13 DIAGNOSIS — I456 Pre-excitation syndrome: Secondary | ICD-10-CM

## 2020-01-13 DIAGNOSIS — I10 Essential (primary) hypertension: Secondary | ICD-10-CM

## 2020-01-13 NOTE — Patient Instructions (Signed)

## 2020-01-13 NOTE — Assessment & Plan Note (Addendum)
Asymptomatic, off beta blocker Will monitor

## 2020-01-13 NOTE — Assessment & Plan Note (Signed)
Improved on Sertraline, will continue for now

## 2020-01-13 NOTE — Assessment & Plan Note (Signed)
No issues without use of CPAP

## 2020-01-13 NOTE — Progress Notes (Signed)
Subjective:    Patient ID: Michael Kane, male    DOB: 1929/04/16, 86 y.o.   MRN: [id number]  HPI  Resident seen in [address] for routine followup. RN reports resident having issues with cleaning himself up after bowel movements. RN reports he will have leakage in his underwear, and just throw them in the trash. Resident denies loose stools, diarrhea or bowel incontinence. Otherwise, he seems to be doing better. Mood has improved since starting Sertraline. He is happy to report that he has a girlfriend, and this makes him very happy. He sleeps well. He is independent with ADL's. He walks without a device. His appetite is good, no weight loss. He denies urinary incontinence. He denies pain, reflux or SOB.  HTN/WPW: His BP today is 149/67. He is taking Amlodipine and HCTZ as prescribed.  MCI: Mild cognitive and functional needs. Stable on Namenda.  OSA: He sleeps without a CPAP at this time.  Mood Disorder: Mainly depression. Improved since starting Sertraline.  Review of Systems  Past Medical History:  Diagnosis Date  . History of prostate cancer    RT 2006  . Hx of colonic polyps   . Hypertension   . Mild cognitive impairment   . OSA (obstructive sleep apnea)    on CPAP of 10  . Rosacea   . Tremor, essential   . Wolff-Parkinson-White syndrome 1970    Current Outpatient Medications  Medication Sig Dispense Refill  . amLODipine (NORVASC) 10 MG tablet TAKE 1 TABLET BY MOUTH EVERY DAY 90 tablet 3  . hydrochlorothiazide (HYDRODIURIL) 25 MG tablet Take 1 tablet (25 mg total) by mouth daily. 90 tablet 3  . memantine (NAMENDA) 5 MG tablet Take 5 mg by mouth 2 (two) times daily.    . Multiple Vitamins-Minerals (MENS MULTI VITAMIN & MINERAL PO) Take by mouth daily.      . sertraline (ZOLOFT) 25 MG tablet Take 25 mg by mouth daily.    . traZODone (DESYREL) 50 MG tablet Take 50 mg by mouth at bedtime.     No current facility-administered medications for this visit.    Allergies    Allergen Reactions  . Lisinopril     REACTION: cough    Family History  Problem Relation Age of Onset  . Stroke Brother     Social History   Socioeconomic History  . Marital status: Widowed    Spouse name: Not on file  . Number of children: 3  . Years of education: Not on file  . Highest education level: Not on file  Occupational History  . Occupation: retired Research scientist (physical sciences): retired  Tobacco Use  . Smoking status: Former Smoker    Types: Cigarettes    Quit date: 04/23/1958    Years since quitting: 61.7  . Smokeless tobacco: Never Used  Substance and Sexual Activity  . Alcohol use: Yes    Alcohol/week: 0.0 standard drinks    Comment: 1 beer daily  . Drug use: No  . Sexual activity: Not on file  Other Topics Concern  . Not on file  Social History Narrative   Wrote poetry and  short stories.  Occ memoirs (has been published)      Has living will.    Requested DNR so form completed.    Requests daughter [name] as health care POA.    Would accept feeding tube unless vegetative state         Social Determinants of Health  Financial Resource Strain:   . Difficulty of Paying Living Expenses: Not on file  Food Insecurity:   . Worried About Charity fundraiser in the Last Year: Not on file  . Ran Out of Food in the Last Year: Not on file  Transportation Needs:   . Lack of Transportation (Medical): Not on file  . Lack of Transportation (Non-Medical): Not on file  Physical Activity:   . Days of Exercise per Week: Not on file  . Minutes of Exercise per Session: Not on file  Stress:   . Feeling of Stress : Not on file  Social Connections:   . Frequency of Communication with Friends and Family: Not on file  . Frequency of Social Gatherings with Friends and Family: Not on file  . Attends Religious Services: Not on file  . Active Member of Clubs or Organizations: Not on file  . Attends Archivist Meetings: Not on file  . Marital  Status: Not on file  Intimate Partner Violence:   . Fear of Current or Ex-Partner: Not on file  . Emotionally Abused: Not on file  . Physically Abused: Not on file  . Sexually Abused: Not on file     Constitutional: Denies fever, malaise, fatigue, headache or abrupt weight changes.  HEENT: Denies eye pain, eye redness, ear pain, ringing in the ears, wax buildup, runny nose, nasal congestion, bloody nose, or sore throat. Respiratory: Denies difficulty breathing, shortness of breath, cough or sputum production.   Cardiovascular: Denies chest pain, chest tightness, palpitations or swelling in the hands or feet.  Gastrointestinal: Denies abdominal pain, bloating, constipation, diarrhea or blood in the stool.  GU: Denies urgency, frequency, pain with urination, burning sensation, blood in urine, odor or discharge. Musculoskeletal: Denies decrease in range of motion, difficulty with gait, muscle pain or joint pain and swelling.  Skin: Denies redness, rashes, lesions or ulcercations.  Neurological: Pt reports difficulty with memory. Denies dizziness, difficulty with speech or problems with balance and coordination.  Psych: Pt has a history of depression. Denies anxiety, SI/HI.  No other specific complaints in a complete review of systems (except as listed in HPI above).     Objective:   Physical Exam  BP (!) 149/67   Pulse (!) 56   Temp 97.7 F (36.5 C)   Resp 17   Wt 176 lb 3.2 oz (79.9 kg)   SpO2 98%   BMI 27.80 kg/m   Wt Readings from Last 3 Encounters:  12/31/19 176 lb 3.2 oz (79.9 kg)  10/15/19 177 lb (80.3 kg)  04/16/19 180 lb 8 oz (81.9 kg)    General: Appears his stated age, well developed, well nourished in NAD. Skin: Warm, dry and intact. No rashes noted. HEENT: Head: normal shape and size; Eyes:EOMs intact;  Neck:  Neck supple, trachea midline. No masses, lumps or thyromegaly present.  Cardiovascular: Bradycardic with normal rhythm. S1,S2 noted. Murmur noted. No JVD  or BLE edema.  Pulmonary/Chest: Normal effort and positive vesicular breath sounds. No respiratory distress. No wheezes, rales or ronchi noted.  Abdomen: Soft and nontender. Normal bowel sounds.  Musculoskeletal: Gait slow but steady without device. Neurological: Alert and oriented. Psychiatric: Mood and affect normal. He appears to be in a good mood today.  BMET    Component Value Date/Time   NA 140 05/29/2016 1100   NA 132 (L) 07/11/2012 0425   K 4.1 05/29/2016 1100   K 3.9 07/11/2012 0425   CL 105 05/29/2016 1100  CL 99 07/11/2012 0425   CO2 30 05/29/2016 1100   CO2 25 07/11/2012 0425   GLUCOSE 91 05/29/2016 1100   GLUCOSE 100 (H) 07/11/2012 0425   BUN 20 05/29/2016 1100   BUN 16 07/11/2012 0425   CREATININE 1.2 09/11/2018 0000   CREATININE 1.06 05/29/2016 1100   CREATININE 0.86 07/11/2012 0425   CALCIUM 9.1 05/29/2016 1100   CALCIUM 8.3 (L) 07/11/2012 0425   GFRNONAA >60 07/11/2012 0425   GFRAA >60 07/11/2012 0425    Lipid Panel  No results found for: CHOL, TRIG, HDL, CHOLHDL, VLDL, LDLCALC  CBC    Component Value Date/Time   WBC 6.0 05/29/2016 1100   RBC 4.54 05/29/2016 1100   HGB 14.7 05/29/2016 1100   HGB 12.3 (L) 07/11/2012 0425   HCT 43.3 05/29/2016 1100   HCT 40.6 06/16/2012 0910   PLT 222.0 05/29/2016 1100   PLT 152 07/11/2012 0425   MCV 95.4 05/29/2016 1100   MCV 96 06/16/2012 0910   MCH 33.2 06/16/2012 0910   MCHC 34.0 05/29/2016 1100   RDW 14.2 05/29/2016 1100   RDW 13.6 06/16/2012 0910   LYMPHSABS 1.7 05/29/2016 1100   MONOABS 0.8 05/29/2016 1100   EOSABS 0.2 05/29/2016 1100   BASOSABS 0.0 05/29/2016 1100    Hgb A1C No results found for: HGBA1C         Assessment & Plan:   Webb Silversmith, NP

## 2020-01-13 NOTE — Assessment & Plan Note (Signed)
Asymptomatic. 

## 2020-01-13 NOTE — Assessment & Plan Note (Signed)
No ACEI/ARB Will monitor yearly BMET

## 2020-01-13 NOTE — Assessment & Plan Note (Signed)
Mild, continue Namenda Appreciate ALF care

## 2020-01-13 NOTE — Assessment & Plan Note (Signed)
Continue Amlodipine and HCTZ Will monitor yearly BMET

## 2020-03-21 DIAGNOSIS — F39 Unspecified mood [affective] disorder: Secondary | ICD-10-CM

## 2020-03-21 DIAGNOSIS — N1831 Chronic kidney disease, stage 3a: Secondary | ICD-10-CM

## 2020-03-21 DIAGNOSIS — I10 Essential (primary) hypertension: Secondary | ICD-10-CM

## 2020-03-21 DIAGNOSIS — F0151 Vascular dementia with behavioral disturbance: Secondary | ICD-10-CM

## 2020-03-25 DIAGNOSIS — N183 Chronic kidney disease, stage 3 unspecified: Secondary | ICD-10-CM | POA: Diagnosis not present

## 2020-03-25 DIAGNOSIS — I350 Nonrheumatic aortic (valve) stenosis: Secondary | ICD-10-CM

## 2020-03-25 DIAGNOSIS — F39 Unspecified mood [affective] disorder: Secondary | ICD-10-CM | POA: Diagnosis not present

## 2020-03-25 DIAGNOSIS — I456 Pre-excitation syndrome: Secondary | ICD-10-CM | POA: Diagnosis not present

## 2020-03-25 DIAGNOSIS — I10 Essential (primary) hypertension: Secondary | ICD-10-CM | POA: Diagnosis not present

## 2020-03-25 DIAGNOSIS — F0150 Vascular dementia without behavioral disturbance: Secondary | ICD-10-CM | POA: Diagnosis not present

## 2020-03-25 DIAGNOSIS — R197 Diarrhea, unspecified: Secondary | ICD-10-CM

## 2020-04-04 ENCOUNTER — Encounter: Payer: Self-pay | Admitting: Internal Medicine

## 2020-04-06 DIAGNOSIS — H6123 Impacted cerumen, bilateral: Secondary | ICD-10-CM | POA: Diagnosis not present

## 2020-04-11 ENCOUNTER — Emergency Department: Payer: Medicare Other

## 2020-04-11 ENCOUNTER — Other Ambulatory Visit: Payer: Self-pay

## 2020-04-11 ENCOUNTER — Inpatient Hospital Stay
Admission: EM | Admit: 2020-04-11 | Discharge: 2020-04-15 | DRG: 481 | Disposition: A | Discharge status: Skilled Nursing Facility | Payer: Medicare Other | Source: Skilled Nursing Facility | Attending: Family Medicine | Admitting: Family Medicine

## 2020-04-11 DIAGNOSIS — Z043 Encounter for examination and observation following other accident: Secondary | ICD-10-CM | POA: Diagnosis not present

## 2020-04-11 DIAGNOSIS — S72002A Fracture of unspecified part of neck of left femur, initial encounter for closed fracture: Secondary | ICD-10-CM | POA: Diagnosis present

## 2020-04-11 DIAGNOSIS — Z8719 Personal history of other diseases of the digestive system: Secondary | ICD-10-CM | POA: Diagnosis not present

## 2020-04-11 DIAGNOSIS — Z96652 Presence of left artificial knee joint: Secondary | ICD-10-CM | POA: Diagnosis not present

## 2020-04-11 DIAGNOSIS — S72145D Nondisplaced intertrochanteric fracture of left femur, subsequent encounter for closed fracture with routine healing: Secondary | ICD-10-CM | POA: Diagnosis not present

## 2020-04-11 DIAGNOSIS — R2681 Unsteadiness on feet: Secondary | ICD-10-CM | POA: Diagnosis not present

## 2020-04-11 DIAGNOSIS — Z888 Allergy status to other drugs, medicaments and biological substances status: Secondary | ICD-10-CM | POA: Diagnosis not present

## 2020-04-11 DIAGNOSIS — N1831 Chronic kidney disease, stage 3a: Secondary | ICD-10-CM | POA: Diagnosis present

## 2020-04-11 DIAGNOSIS — G4733 Obstructive sleep apnea (adult) (pediatric): Secondary | ICD-10-CM | POA: Diagnosis not present

## 2020-04-11 DIAGNOSIS — E739 Lactose intolerance, unspecified: Secondary | ICD-10-CM | POA: Diagnosis not present

## 2020-04-11 DIAGNOSIS — Y9354 Activity, bowling: Secondary | ICD-10-CM | POA: Diagnosis not present

## 2020-04-11 DIAGNOSIS — I456 Pre-excitation syndrome: Secondary | ICD-10-CM | POA: Diagnosis not present

## 2020-04-11 DIAGNOSIS — R3911 Hesitancy of micturition: Secondary | ICD-10-CM | POA: Diagnosis not present

## 2020-04-11 DIAGNOSIS — R410 Disorientation, unspecified: Secondary | ICD-10-CM | POA: Diagnosis not present

## 2020-04-11 DIAGNOSIS — R279 Unspecified lack of coordination: Secondary | ICD-10-CM | POA: Diagnosis not present

## 2020-04-11 DIAGNOSIS — E440 Moderate protein-calorie malnutrition: Secondary | ICD-10-CM | POA: Diagnosis not present

## 2020-04-11 DIAGNOSIS — I129 Hypertensive chronic kidney disease with stage 1 through stage 4 chronic kidney disease, or unspecified chronic kidney disease: Secondary | ICD-10-CM | POA: Diagnosis present

## 2020-04-11 DIAGNOSIS — R262 Difficulty in walking, not elsewhere classified: Secondary | ICD-10-CM | POA: Diagnosis present

## 2020-04-11 DIAGNOSIS — Z823 Family history of stroke: Secondary | ICD-10-CM

## 2020-04-11 DIAGNOSIS — F01518 Vascular dementia, unspecified severity, with other behavioral disturbance: Secondary | ICD-10-CM | POA: Diagnosis present

## 2020-04-11 DIAGNOSIS — Z79899 Other long term (current) drug therapy: Secondary | ICD-10-CM

## 2020-04-11 DIAGNOSIS — F32A Depression, unspecified: Secondary | ICD-10-CM | POA: Diagnosis not present

## 2020-04-11 DIAGNOSIS — F0150 Vascular dementia without behavioral disturbance: Secondary | ICD-10-CM | POA: Diagnosis present

## 2020-04-11 DIAGNOSIS — R5381 Other malaise: Secondary | ICD-10-CM | POA: Diagnosis not present

## 2020-04-11 DIAGNOSIS — S72142A Displaced intertrochanteric fracture of left femur, initial encounter for closed fracture: Principal | ICD-10-CM | POA: Diagnosis present

## 2020-04-11 DIAGNOSIS — Z66 Do not resuscitate: Secondary | ICD-10-CM | POA: Diagnosis present

## 2020-04-11 DIAGNOSIS — W1830XA Fall on same level, unspecified, initial encounter: Secondary | ICD-10-CM | POA: Diagnosis present

## 2020-04-11 DIAGNOSIS — F01B0 Vascular dementia, moderate, without behavioral disturbance, psychotic disturbance, mood disturbance, and anxiety: Secondary | ICD-10-CM | POA: Diagnosis present

## 2020-04-11 DIAGNOSIS — E876 Hypokalemia: Secondary | ICD-10-CM | POA: Diagnosis not present

## 2020-04-11 DIAGNOSIS — R52 Pain, unspecified: Secondary | ICD-10-CM | POA: Diagnosis not present

## 2020-04-11 DIAGNOSIS — I350 Nonrheumatic aortic (valve) stenosis: Secondary | ICD-10-CM

## 2020-04-11 DIAGNOSIS — R278 Other lack of coordination: Secondary | ICD-10-CM | POA: Diagnosis not present

## 2020-04-11 DIAGNOSIS — Z20822 Contact with and (suspected) exposure to covid-19: Secondary | ICD-10-CM | POA: Diagnosis present

## 2020-04-11 DIAGNOSIS — Z87891 Personal history of nicotine dependence: Secondary | ICD-10-CM | POA: Diagnosis not present

## 2020-04-11 DIAGNOSIS — R339 Retention of urine, unspecified: Secondary | ICD-10-CM | POA: Diagnosis not present

## 2020-04-11 DIAGNOSIS — M6281 Muscle weakness (generalized): Secondary | ICD-10-CM | POA: Diagnosis not present

## 2020-04-11 DIAGNOSIS — I10 Essential (primary) hypertension: Secondary | ICD-10-CM | POA: Diagnosis not present

## 2020-04-11 DIAGNOSIS — Y9239 Other specified sports and athletic area as the place of occurrence of the external cause: Secondary | ICD-10-CM | POA: Diagnosis not present

## 2020-04-11 DIAGNOSIS — Z419 Encounter for procedure for purposes other than remedying health state, unspecified: Secondary | ICD-10-CM

## 2020-04-11 DIAGNOSIS — Z8546 Personal history of malignant neoplasm of prostate: Secondary | ICD-10-CM | POA: Diagnosis not present

## 2020-04-11 DIAGNOSIS — W19XXXA Unspecified fall, initial encounter: Secondary | ICD-10-CM

## 2020-04-11 DIAGNOSIS — Z6824 Body mass index (BMI) 24.0-24.9, adult: Secondary | ICD-10-CM | POA: Diagnosis not present

## 2020-04-11 DIAGNOSIS — N1832 Chronic kidney disease, stage 3b: Secondary | ICD-10-CM | POA: Diagnosis present

## 2020-04-11 DIAGNOSIS — M25552 Pain in left hip: Secondary | ICD-10-CM | POA: Diagnosis present

## 2020-04-11 DIAGNOSIS — Z741 Need for assistance with personal care: Secondary | ICD-10-CM | POA: Diagnosis not present

## 2020-04-11 DIAGNOSIS — R338 Other retention of urine: Secondary | ICD-10-CM | POA: Diagnosis not present

## 2020-04-11 DIAGNOSIS — R0902 Hypoxemia: Secondary | ICD-10-CM | POA: Diagnosis not present

## 2020-04-11 LAB — CBC WITH DIFFERENTIAL/PLATELET
Abs Immature Granulocytes: 0.04 10*3/uL (ref 0.00–0.07)
Basophils Absolute: 0 10*3/uL (ref 0.0–0.1)
Basophils Relative: 0 %
Eosinophils Absolute: 0.3 10*3/uL (ref 0.0–0.5)
Eosinophils Relative: 4 %
HCT: 44 % (ref 39.0–52.0)
Hemoglobin: 15.3 g/dL (ref 13.0–17.0)
Immature Granulocytes: 1 %
Lymphocytes Relative: 29 %
Lymphs Abs: 2.5 10*3/uL (ref 0.7–4.0)
MCH: 31.9 pg (ref 26.0–34.0)
MCHC: 34.8 g/dL (ref 30.0–36.0)
MCV: 91.9 fL (ref 80.0–100.0)
Monocytes Absolute: 1.1 10*3/uL — ABNORMAL HIGH (ref 0.1–1.0)
Monocytes Relative: 12 %
Neutro Abs: 4.6 10*3/uL (ref 1.7–7.7)
Neutrophils Relative %: 54 %
Platelets: 228 10*3/uL (ref 150–400)
RBC: 4.79 MIL/uL (ref 4.22–5.81)
RDW: 13.8 % (ref 11.5–15.5)
WBC: 8.6 10*3/uL (ref 4.0–10.5)
nRBC: 0 % (ref 0.0–0.2)

## 2020-04-11 LAB — BASIC METABOLIC PANEL
Anion gap: 12 (ref 5–15)
BUN: 24 mg/dL — ABNORMAL HIGH (ref 8–23)
CO2: 26 mmol/L (ref 22–32)
Calcium: 9.5 mg/dL (ref 8.9–10.3)
Chloride: 100 mmol/L (ref 98–111)
Creatinine, Ser: 1.13 mg/dL (ref 0.61–1.24)
GFR, Estimated: >60 mL/min (ref >60)
Glucose, Bld: 111 mg/dL — ABNORMAL HIGH (ref 70–99)
Potassium: 3.8 mmol/L (ref 3.5–5.1)
Sodium: 138 mmol/L (ref 135–145)

## 2020-04-11 LAB — TYPE AND SCREEN
ABO/RH(D): A POS
Antibody Screen: NEGATIVE

## 2020-04-11 LAB — RESP PANEL BY RT-PCR (FLU A&B, COVID) ARPGX2
Influenza A by PCR: NEGATIVE
Influenza B by PCR: NEGATIVE
SARS Coronavirus 2 by RT PCR: NEGATIVE

## 2020-04-11 LAB — MRSA PCR SCREENING: MRSA by PCR: NEGATIVE

## 2020-04-11 MED ORDER — HYDROMORPHONE HCL 1 MG/ML IJ SOLN
0.5000 mg | Freq: Once | INTRAMUSCULAR | Status: AC
  Administered 2020-04-11: 13:00:00 0.5 mg via INTRAVENOUS
  Filled 2020-04-11: qty 1

## 2020-04-11 MED ORDER — HYDROCODONE-ACETAMINOPHEN 5-325 MG PO TABS
1.0000 | ORAL_TABLET | Freq: Four times a day (QID) | ORAL | Status: DC | PRN
  Administered 2020-04-11 – 2020-04-14 (×2): 1 via ORAL
  Filled 2020-04-11 (×2): qty 1

## 2020-04-11 MED ORDER — ONDANSETRON HCL 4 MG/2ML IJ SOLN
INTRAMUSCULAR | Status: AC
  Filled 2020-04-11: qty 2

## 2020-04-11 MED ORDER — METHOCARBAMOL 500 MG PO TABS
500.0000 mg | ORAL_TABLET | Freq: Four times a day (QID) | ORAL | Status: DC | PRN
  Filled 2020-04-11: qty 1

## 2020-04-11 MED ORDER — MORPHINE SULFATE (PF) 2 MG/ML IV SOLN
0.5000 mg | INTRAVENOUS | Status: DC | PRN
  Administered 2020-04-11 – 2020-04-12 (×5): 0.5 mg via INTRAVENOUS
  Filled 2020-04-11 (×5): qty 1

## 2020-04-11 MED ORDER — METHOCARBAMOL 1000 MG/10ML IJ SOLN
500.0000 mg | Freq: Four times a day (QID) | INTRAVENOUS | Status: DC | PRN
  Filled 2020-04-11 (×2): qty 5

## 2020-04-11 MED ORDER — ONDANSETRON HCL 4 MG/2ML IJ SOLN
4.0000 mg | Freq: Once | INTRAMUSCULAR | Status: AC
  Administered 2020-04-11: 12:00:00 4 mg via INTRAVENOUS

## 2020-04-11 MED ORDER — MORPHINE SULFATE (PF) 4 MG/ML IV SOLN
4.0000 mg | Freq: Once | INTRAVENOUS | Status: AC
  Administered 2020-04-11: 12:00:00 4 mg via INTRAVENOUS
  Filled 2020-04-11: qty 1

## 2020-04-11 MED ORDER — MEMANTINE HCL 5 MG PO TABS
5.0000 mg | ORAL_TABLET | Freq: Two times a day (BID) | ORAL | Status: DC
  Administered 2020-04-12 – 2020-04-15 (×6): 5 mg via ORAL
  Filled 2020-04-11 (×6): qty 1

## 2020-04-11 MED ORDER — AMLODIPINE BESYLATE 10 MG PO TABS
10.0000 mg | ORAL_TABLET | Freq: Every day | ORAL | Status: DC
  Administered 2020-04-13 – 2020-04-15 (×3): 10 mg via ORAL
  Filled 2020-04-11 (×3): qty 1

## 2020-04-11 MED ORDER — CHOLESTYRAMINE 4 G PO PACK
4.0000 g | PACK | Freq: Every day | ORAL | Status: DC
  Administered 2020-04-11 – 2020-04-14 (×4): 4 g via ORAL
  Filled 2020-04-11 (×5): qty 1

## 2020-04-11 MED ORDER — TRANEXAMIC ACID-NACL 1000-0.7 MG/100ML-% IV SOLN
1000.0000 mg | INTRAVENOUS | Status: AC
  Filled 2020-04-11: qty 100

## 2020-04-11 MED ORDER — CEFAZOLIN SODIUM-DEXTROSE 2-4 GM/100ML-% IV SOLN
2.0000 g | INTRAVENOUS | Status: AC
  Administered 2020-04-12: 12:00:00 2 g via INTRAVENOUS
  Filled 2020-04-11: qty 100

## 2020-04-11 MED ORDER — HYDROCHLOROTHIAZIDE 25 MG PO TABS
25.0000 mg | ORAL_TABLET | Freq: Every day | ORAL | Status: DC
  Administered 2020-04-13 – 2020-04-15 (×3): 25 mg via ORAL
  Filled 2020-04-11 (×3): qty 1

## 2020-04-11 MED ORDER — SENNA 8.6 MG PO TABS
1.0000 | ORAL_TABLET | Freq: Two times a day (BID) | ORAL | Status: DC
  Administered 2020-04-11 – 2020-04-15 (×8): 8.6 mg via ORAL
  Filled 2020-04-11 (×7): qty 1

## 2020-04-11 MED ORDER — SERTRALINE HCL 50 MG PO TABS
25.0000 mg | ORAL_TABLET | Freq: Every day | ORAL | Status: DC
  Administered 2020-04-13 – 2020-04-15 (×3): 25 mg via ORAL
  Filled 2020-04-11 (×3): qty 1

## 2020-04-11 NOTE — ED Notes (Signed)
Pt to XRAY

## 2020-04-11 NOTE — Anesthesia Preprocedure Evaluation (Addendum)
Anesthesia Evaluation  Patient identified by MRN, date of birth, ID band  Airway Mallampati: II       Dental   Pulmonary neg shortness of breath, sleep apnea , former smoker,     + decreased breath sounds      Cardiovascular hypertension, (-) angina(-) Past MI and (-) DOE  Rhythm:regular Rate:Normal  WPW   Neuro/Psych PSYCHIATRIC DISORDERS Depression Dementia negative neurological ROS     GI/Hepatic negative GI ROS, Neg liver ROS,   Endo/Other  negative endocrine ROS  Renal/GU Renal disease (CKD)     Musculoskeletal   Abdominal   Peds  Hematology negative hematology ROS (+)   Anesthesia Other Findings Past Medical History: No date: History of prostate cancer     Comment:  RT 2006 No date: Hx of colonic polyps No date: Hypertension No date: Mild cognitive impairment No date: OSA (obstructive sleep apnea)     Comment:  on CPAP of 10 No date: Rosacea No date: Tremor, essential 1970: Wolff-Parkinson-White syndrome  Past Surgical History: No date: TONSILLECTOMY 3/14: TOTAL KNEE ARTHROPLASTY; Left  BMI    Body Mass Index: 25.83 kg/m      Reproductive/Obstetrics negative OB ROS                           Anesthesia Physical Anesthesia Plan  ASA: III  Anesthesia Plan: Spinal   Post-op Pain Management:    Induction:   PONV Risk Score and Plan:   Airway Management Planned: Simple Face Mask  Additional Equipment:   Intra-op Plan:   Post-operative Plan:   Informed Consent:   Patient has DNR.  Suspend DNR and Discussed DNR with power of attorney.     Plan Discussed with:   Anesthesia Plan Comments: (History and phone consent from the patients daughter Roni Bread at [phone]   She reports no bleeding problems and no anticoagulant use.  Plan for spinal with backup GA  Daughter consented for risks of anesthesia including but not limited to:  - adverse reactions to  medications - damage to eyes, teeth, lips or other oral mucosa - nerve damage due to positioning  - risk of bleeding, infection and or nerve damage from spinal that could lead to paralysis - risk of headache or failed spinal - damage to teeth, lips or other oral mucosa - sore throat or hoarseness - damage to heart, brain, nerves, lungs, other parts of body or loss of life  She voiced understanding.)       Anesthesia Quick Evaluation

## 2020-04-11 NOTE — ED Provider Notes (Signed)
[facility] Emergency Department Provider Note   ____________________________________________   Event Date/Time   First MD Initiated Contact with Patient 04/11/20 1158     (approximate)  I have reviewed the triage vital signs and the nursing notes.   HISTORY  Chief Complaint Fall   HPI Michael Kane is a 86 y.o. male with past medical history of hypertension, dementia, CKD, and Wolff-Parkinson-White who presents to the ED complaining of left hip pain.  Per EMS, patient was bowling when he got a strike and jumped up and excitement.  Afterwards, he fell to the ground and onto his left hip.  He has complained of significant pain in his left hip since the fall and has been unable to ambulate.  He denies hitting his head or losing consciousness, does not take any blood thinners.  He denies pain anywhere other than his left hip.        Past Medical History:  Diagnosis Date  . History of prostate cancer    RT 2006  . Hx of colonic polyps   . Hypertension   . Mild cognitive impairment   . OSA (obstructive sleep apnea)    on CPAP of 10  . Rosacea   . Tremor, essential   . Wolff-Parkinson-White syndrome 1970    Patient Active Problem List   Diagnosis Date Noted  . Closed left hip fracture ([place]) 04/11/2020  . Stage 3a chronic kidney disease ([place]) 10/15/2019  . History of prostate cancer 08/10/2017  . Mood disorder ([place]) 05/27/2015  . Aortic stenosis 06/24/2012  . Vascular dementia ([place]) 05/19/2008  . Essential hypertension, benign 06/09/2007  . WOLFF (WOLFE)-PARKINSON-WHITE (WPW) SYNDROME 06/09/2007  . OSA (obstructive sleep apnea) 06/09/2007    Past Surgical History:  Procedure Laterality Date  . TONSILLECTOMY    . TOTAL KNEE ARTHROPLASTY Left 3/14    Prior to Admission medications   Medication Sig Start Date End Date Taking? Authorizing Provider  amLODipine (NORVASC) 10 MG tablet TAKE 1 TABLET BY MOUTH EVERY DAY 07/05/17   Viviana Simpler I,  MD  hydrochlorothiazide (HYDRODIURIL) 25 MG tablet Take 1 tablet (25 mg total) by mouth daily. 02/20/18   Venia Carbon, MD  memantine (NAMENDA) 5 MG tablet Take 5 mg by mouth 2 (two) times daily.    [provider]  Multiple Vitamins-Minerals (MENS MULTI VITAMIN & MINERAL PO) Take by mouth daily.      [provider]  sertraline (ZOLOFT) 25 MG tablet Take 25 mg by mouth daily.    [provider]    Allergies Lisinopril  Family History  Problem Relation Age of Onset  . Stroke Brother     Social History Social History   Tobacco Use  . Smoking status: Former Smoker    Types: Cigarettes    Quit date: 04/23/1958    Years since quitting: 62.0  . Smokeless tobacco: Never Used  Substance Use Topics  . Alcohol use: Yes    Alcohol/week: 0.0 standard drinks    Comment: 1 beer daily  . Drug use: No    Review of Systems  Constitutional: No fever/chills Eyes: No visual changes. ENT: No sore throat. Cardiovascular: Denies chest pain. Respiratory: Denies shortness of breath. Gastrointestinal: No abdominal pain.  No nausea, no vomiting.  No diarrhea.  No constipation. Genitourinary: Negative for dysuria. Musculoskeletal: Negative for back pain.  Positive for left hip pain. Skin: Negative for rash. Neurological: Negative for headaches, focal weakness or numbness.  ____________________________________________   PHYSICAL  EXAM:  VITAL SIGNS: ED Triage Vitals  Enc Vitals Group     BP      Pulse      Resp      Temp      Temp src      SpO2      Weight      Height      Head Circumference      Peak Flow      Pain Score      Pain Loc      Pain Edu?      Excl. in [place]?     Constitutional: Alert and oriented. Eyes: Conjunctivae are normal. Head: Atraumatic. Nose: No congestion/rhinnorhea. Mouth/Throat: Mucous membranes are moist. Neck: Normal ROM, no midline cervical spine tenderness. Cardiovascular: Normal rate, regular rhythm. Grossly normal  heart sounds. Respiratory: Normal respiratory effort.  No retractions. Lungs CTAB. Gastrointestinal: Soft and nontender. No distention. Genitourinary: deferred Musculoskeletal: Diffuse tenderness to left hip with shortening and external rotation noted.  No right hip tenderness, bilateral knee tenderness, or bilateral ankle tenderness. Neurologic:  Normal speech and language. No gross focal neurologic deficits are appreciated. Skin:  Skin is warm, dry and intact. No rash noted. Psychiatric: Mood and affect are normal. Speech and behavior are normal.  ____________________________________________   LABS (all labs ordered are listed, but only abnormal results are displayed)  Labs Reviewed  BASIC METABOLIC PANEL - Abnormal; Notable for the following components:      Result Value   Glucose, Bld 111 (*)    BUN 24 (*)    All other components within normal limits  CBC WITH DIFFERENTIAL/PLATELET - Abnormal; Notable for the following components:   Monocytes Absolute 1.1 (*)    All other components within normal limits  RESP PANEL BY RT-PCR (FLU A&B, COVID) ARPGX2  SAMPLE TO BLOOD BANK  TYPE AND SCREEN   ____________________________________________  EKG  ED ECG REPORT I, Blake Divine, the attending physician, personally viewed and interpreted this ECG.   Date: 04/11/2020  EKG Time: 12:00  Rate: 59  Rhythm: normal sinus rhythm  Axis: Normal  Intervals:nonspecific intraventricular conduction delay  ST&T Change: None   PROCEDURES  Procedure(s) performed (including Critical Care):  Procedures   ____________________________________________   INITIAL IMPRESSION / ASSESSMENT AND PLAN / ED COURSE       86 year old male presents to the ED following mechanical fall onto his left hip with severe left hip pain and difficulty walking since the fall.  He has an obvious deformity to his left hip with shortening and external rotation.  He is neurovascularly intact to his left lower  extremity with 2+ DP pulse.  We will further assess with x-ray, EKG, labs, and treat pain with morphine.  X-rays reviewed by me and show left intertrochanteric hip fracture.  Chest x-ray negative for acute process, lab work is unremarkable.  EKG shows no evidence of arrhythmia or ischemia.  Pain is partially improved with morphine and we will dose of 0.5 mg IV Dilaudid.  Case discussed with Dr. Harlow Mares of orthopedics as well as hospitalist for admission.      ____________________________________________   FINAL CLINICAL IMPRESSION(S) / ED DIAGNOSES  Final diagnoses:  Closed fracture of left hip, initial encounter [facility])  Vascular dementia without behavioral disturbance [facility])     ED Discharge Orders    None       Note:  This document was prepared using Dragon voice recognition software and may include unintentional dictation errors.   Blake Divine,  MD 04/11/20 1329

## 2020-04-11 NOTE — H&P (Addendum)
History and Physical    Michael Kane XLK:[id number] DOB: February 16, 1929 DOA: 04/11/2020  PCP: Venia Carbon, MD   Patient coming from: Skilled nursing facility  I have personally briefly reviewed patient's old medical records in [place]  Chief Complaint: Fall                               Left hip pain   Most of the history was obtained from patient's daughter at the bedside.  He is unable to provide any history due to dementia.  HPI: Michael Kane is a 86 y.o. male with medical history significant for hypertension, dementia, stage III chronic kidney disease and Wolff-Parkinson-White syndrome who presents to the ER via EMS following a fall with complaints of left hip pain.  Per his daughter, he was bowling when he got a strike and jumped up in excitement.  He appears to have tripped on his feet and fell landing on his left hip.  He was unable to get up due to significant pain in his left hip and has been unable to ambulate since then.  Per the nursing home staff, he did not hit his head or lose consciousness. I am unable to do a review of systems on this patient due to his dementia. Labs show sodium 138, potassium 3.8, chloride 100, bicarb 26, glucose 111, BUN 24, creatinine 1.13, calcium 9.5, white count 8.6, hemoglobin 15.3, hematocrit 44, MCV 91.9, RDW 13.8, platelet count 228  Chest x-ray reviewed by me shows mild bibasilar scarring.  No acute abnormality noted. Left hip x-ray shows left intratrochanteric fracture Twelve-lead EKG shows sinus rhythm with LVH    ED Course: Patient is an 86 year old Caucasian male who resides in a skilled nursing facility and presents to the emergency room via EMS following a mechanical fall with a left hip fracture.  He will be admitted to the hospital for further evaluation.  Review of Systems: As per HPI otherwise all systems reviewed and negative.    Past Medical History:  Diagnosis Date  . History of prostate cancer    RT 2006  .  Hx of colonic polyps   . Hypertension   . Mild cognitive impairment   . OSA (obstructive sleep apnea)    on CPAP of 10  . Rosacea   . Tremor, essential   . Wolff-Parkinson-White syndrome 1970    Past Surgical History:  Procedure Laterality Date  . TONSILLECTOMY    . TOTAL KNEE ARTHROPLASTY Left 3/14     reports that he quit smoking about 62 years ago. His smoking use included cigarettes. He has never used smokeless tobacco. He reports current alcohol use. He reports that he does not use drugs.  Allergies  Allergen Reactions  . Lisinopril     REACTION: cough    Family History  Problem Relation Age of Onset  . Stroke Brother      Prior to Admission medications   Medication Sig Start Date End Date Taking? Authorizing Provider  amLODipine (NORVASC) 10 MG tablet TAKE 1 TABLET BY MOUTH EVERY DAY Patient taking differently: Take 10 mg by mouth daily. 07/05/17  Yes Venia Carbon, MD  cholestyramine Lucrezia Starch) 4 g packet Take 4 g by mouth at bedtime.   Yes [provider]  hydrochlorothiazide (HYDRODIURIL) 25 MG tablet Take 1 tablet (25 mg total) by mouth daily. 02/20/18  Yes Venia Carbon, MD  memantine (NAMENDA) 5 MG  tablet Take 5 mg by mouth 2 (two) times daily.   Yes [provider]  Multiple Vitamins-Minerals (MENS MULTI VITAMIN & MINERAL PO)    Yes [provider]  sertraline (ZOLOFT) 25 MG tablet Take 25 mg by mouth daily.   Yes [provider]    Physical Exam: Vitals:   04/11/20 1204 04/11/20 1230 04/11/20 1323 04/11/20 1506  BP: (!) 194/74 (!) 152/63 (!) 160/68 (!) 165/75  Pulse:  (!) 56 63 77  Resp:  13 16 16   Temp:    97.6 F (36.4 C)  TempSrc:      SpO2:  95% 94% 96%  Weight: 81.6 kg     Height: 5\' 10"  (1.778 m)        Vitals:   04/11/20 1204 04/11/20 1230 04/11/20 1323 04/11/20 1506  BP: (!) 194/74 (!) 152/63 (!) 160/68 (!) 165/75  Pulse:  (!) 56 63 77  Resp:  13 16 16   Temp:    97.6 F (36.4 C)  TempSrc:       SpO2:  95% 94% 96%  Weight: 81.6 kg     Height: 5\' 10"  (1.778 m)       Constitutional: NAD, alert and oriented to person.  Appears comfortable and in no distress Eyes: PERRL, lids and conjunctivae normal ENMT: Mucous membranes are moist.  Neck: normal, supple, no masses, no thyromegaly Respiratory: clear to auscultation bilaterally, no wheezing, no crackles. Normal respiratory effort. No accessory muscle use.  Cardiovascular: Regular rate and rhythm, systolic ejection murmurs / rubs / gallops. No extremity edema. 2+ pedal pulses. No carotid bruits.  Abdomen: no tenderness, no masses palpated. No hepatosplenomegaly. Bowel sounds positive.  Musculoskeletal: no clubbing / cyanosis.  Shortening of the left lower extremity Skin: no rashes, lesions, ulcers.  Neurologic: No gross focal neurologic deficit. Psychiatric: Normal mood and affect.   Labs on Admission: I have personally reviewed following labs and imaging studies  CBC: Recent Labs  Lab 04/11/20 1206  WBC 8.6  NEUTROABS 4.6  HGB 15.3  HCT 44.0  MCV 91.9  PLT 841   Basic Metabolic Panel: Recent Labs  Lab 04/11/20 1206  NA 138  K 3.8  CL 100  CO2 26  GLUCOSE 111*  BUN 24*  CREATININE 1.13  CALCIUM 9.5   GFR: Estimated Creatinine Clearance: 45.8 mL/min (by C-G formula based on SCr of 1.13 mg/dL). Liver Function Tests: No results for input(s): AST, ALT, ALKPHOS, BILITOT, PROT, ALBUMIN in the last 168 hours. No results for input(s): LIPASE, AMYLASE in the last 168 hours. No results for input(s): AMMONIA in the last 168 hours. Coagulation Profile: No results for input(s): INR, PROTIME in the last 168 hours. Cardiac Enzymes: No results for input(s): CKTOTAL, CKMB, CKMBINDEX, TROPONINI in the last 168 hours. BNP (last 3 results) No results for input(s): PROBNP in the last 8760 hours. HbA1C: No results for input(s): HGBA1C in the last 72 hours. CBG: No results for input(s): GLUCAP in the last 168 hours. Lipid  Profile: No results for input(s): CHOL, HDL, LDLCALC, TRIG, CHOLHDL, LDLDIRECT in the last 72 hours. Thyroid Function Tests: No results for input(s): TSH, T4TOTAL, FREET4, T3FREE, THYROIDAB in the last 72 hours. Anemia Panel: No results for input(s): VITAMINB12, FOLATE, FERRITIN, TIBC, IRON, RETICCTPCT in the last 72 hours. Urine analysis:    Component Value Date/Time   COLORURINE Yellow 06/16/2012 0910   APPEARANCEUR Clear 06/16/2012 0910   LABSPEC 1.016 06/16/2012 0910   PHURINE 7.0 06/16/2012 0910   GLUCOSEU  Negative 06/16/2012 0910   HGBUR 1+ 06/16/2012 0910   BILIRUBINUR neg 02/23/2015 0919   BILIRUBINUR Negative 06/16/2012 0910   KETONESUR Negative 06/16/2012 0910   PROTEINUR 1+ 02/23/2015 0919   PROTEINUR Negative 06/16/2012 0910   UROBILINOGEN negative 02/23/2015 0919   NITRITE neg 02/23/2015 0919   NITRITE Negative 06/16/2012 0910   LEUKOCYTESUR Negative 02/23/2015 0919   LEUKOCYTESUR Negative 06/16/2012 0910    Radiological Exams on Admission: DG Chest 1 View  Result Date: 04/11/2020 CLINICAL DATA:  Recent fall with known left hip fracture EXAM: CHEST  1 VIEW COMPARISON:  None. FINDINGS: Cardiac shadow is within normal limits. Aortic calcifications are noted. Mild scarring is noted in the bases bilaterally. No acute bony abnormality is seen. IMPRESSION: Mild bibasilar scarring.  No acute abnormality noted. Electronically Signed   By: Inez Catalina M.D.   On: 04/11/2020 13:34   DG Hip Unilat W or Wo Pelvis 2-3 Views Left  Result Date: 04/11/2020 CLINICAL DATA:  Recent fall with leg pain, initial encounter EXAM: DG HIP (WITH OR WITHOUT PELVIS) 2-3V LEFT COMPARISON:  None. FINDINGS: Pelvic ring is intact. Left intratrochanteric fracture is noted with impaction and angulation at the fracture site. No other focal abnormality is noted. IMPRESSION: Left intratrochanteric fracture as described. Electronically Signed   By: Inez Catalina M.D.   On: 04/11/2020 13:30    EKG:  Independently reviewed.  Sinus rhythm LVH  Assessment/Plan Principal Problem:   Closed left hip fracture (HCC) Active Problems:   Vascular dementia ([place])   Essential hypertension, benign   Aortic stenosis   Stage 3a chronic kidney disease (HCC)   Depression      Status post mechanical fall With closed left hip fracture Immobilize left lower extremity Place patient on fall precautions Pain control Place patient on muscle relaxants Consult orthopedic surgery   Hypertension with complications of stage III chronic kidney disease Continue amlodipine and hydrochlorothiazide Renal function is stable   Dementia /depression Continue Zoloft and Namenda  DVT prophylaxis: SCD Code Status: DO NOT RESUSCITATE Family Communication: Greater than 50% of time was spent discussing patient's condition and plan of care with his daughter Michael Kane at the bedside.  All questions and concerns have been addressed, she verbalizes understanding and agrees with the plan.  CODE STATUS was discussed and he is a DNR Disposition Plan: Back to previous home environment Consults called: Orthopedic surgery    Ayano Douthitt MD [facility]     04/11/2020, 4:13 PM

## 2020-04-11 NOTE — Consult Note (Addendum)
Full consult note to follow. Plan operative fixation tomorrow 12/21 of left hip. NPO after midnight.   ORTHOPAEDIC CONSULTATION  REQUESTING PHYSICIAN: Collier Bullock, MD  Chief Complaint: left hip pain  HPI: Michael Kane is a 86 y.o. male who complains of left hip pain. Please see H&P and ED notes for details. Denies any numbness, tingling or constitutional symptoms.  Past Medical History:  Diagnosis Date  . History of prostate cancer    RT 2006  . Hx of colonic polyps   . Hypertension   . Mild cognitive impairment   . OSA (obstructive sleep apnea)    on CPAP of 10  . Rosacea   . Tremor, essential   . Wolff-Parkinson-White syndrome 1970   Past Surgical History:  Procedure Laterality Date  . TONSILLECTOMY    . TOTAL KNEE ARTHROPLASTY Left 3/14   Social History   Socioeconomic History  . Marital status: Widowed    Spouse name: Not on file  . Number of children: 3  . Years of education: Not on file  . Highest education level: Not on file  Occupational History  . Occupation: retired Research scientist (physical sciences): retired  Tobacco Use  . Smoking status: Former Smoker    Types: Cigarettes    Quit date: 04/23/1958    Years since quitting: 62.0  . Smokeless tobacco: Never Used  Substance and Sexual Activity  . Alcohol use: Yes    Alcohol/week: 0.0 standard drinks    Comment: 1 beer daily  . Drug use: No  . Sexual activity: Not on file  Other Topics Concern  . Not on file  Social History Narrative   Wrote poetry and  short stories.  Occ memoirs (has been published)      Has living will.    Requested DNR so form completed.    Requests daughter [name] as health care POA.    Would accept feeding tube unless vegetative state         Social Determinants of Health   Financial Resource Strain: Not on file  Food Insecurity: Not on file  Transportation Needs: Not on file  Physical Activity: Not on file  Stress: Not on file  Social Connections: Not on file    Family History  Problem Relation Age of Onset  . Stroke Brother    Allergies  Allergen Reactions  . Lisinopril     REACTION: cough   Prior to Admission medications   Medication Sig Start Date End Date Taking? Authorizing Provider  amLODipine (NORVASC) 10 MG tablet TAKE 1 TABLET BY MOUTH EVERY DAY Patient taking differently: Take 10 mg by mouth daily. 07/05/17  Yes Venia Carbon, MD  cholestyramine Lucrezia Starch) 4 g packet Take 4 g by mouth at bedtime.   Yes [provider]  hydrochlorothiazide (HYDRODIURIL) 25 MG tablet Take 1 tablet (25 mg total) by mouth daily. 02/20/18  Yes Venia Carbon, MD  memantine (NAMENDA) 5 MG tablet Take 5 mg by mouth 2 (two) times daily.   Yes [provider]  Multiple Vitamins-Minerals (MENS MULTI VITAMIN & MINERAL PO)    Yes [provider]  sertraline (ZOLOFT) 25 MG tablet Take 25 mg by mouth daily.   Yes [provider]   DG Chest 1 View  Result Date: 04/11/2020 CLINICAL DATA:  Recent fall with known left hip fracture EXAM: CHEST  1 VIEW COMPARISON:  None. FINDINGS: Cardiac shadow is within normal limits. Aortic calcifications are noted. Mild scarring is  noted in the bases bilaterally. No acute bony abnormality is seen. IMPRESSION: Mild bibasilar scarring.  No acute abnormality noted. Electronically Signed   By: Inez Catalina M.D.   On: 04/11/2020 13:34   DG Hip Unilat W or Wo Pelvis 2-3 Views Left  Result Date: 04/11/2020 CLINICAL DATA:  Recent fall with leg pain, initial encounter EXAM: DG HIP (WITH OR WITHOUT PELVIS) 2-3V LEFT COMPARISON:  None. FINDINGS: Pelvic ring is intact. Left intratrochanteric fracture is noted with impaction and angulation at the fracture site. No other focal abnormality is noted. IMPRESSION: Left intratrochanteric fracture as described. Electronically Signed   By: Inez Catalina M.D.   On: 04/11/2020 13:30    Positive ROS: All other systems have been reviewed and were otherwise  negative with the exception of those mentioned in the HPI and as above.  Physical Exam: General: Alert, no acute distress Cardiovascular: No pedal edema Respiratory: No cyanosis, no use of accessory musculature GI: No organomegaly, abdomen is soft and non-tender Skin: No lesions in the area of chief complaint Neurologic: Sensation intact distally Psychiatric: Patient is competent for consent with normal mood and affect Lymphatic: No axillary or cervical lymphadenopathy  MUSCULOSKELETAL: left hip short, externally rotated. Compartments soft. Good cap refill. Motor and sensory intact distally.  Assessment: Left hip intertrochanteric fracture, closed, displaced  Plan: Left hip TFNA tomorrow at 11am. NPO after midnight.  The diagnosis, risks, benefits and alternatives to treatment are all discussed in detail with the patient and family. Risks include but are not limited to bleeding, infection, deep vein thrombosis, pulmonary embolism, nerve or vascular injury, non-union, repeat operation, persistent pain, weakness, stiffness and death. He and his family understand and are eager to proceed.     Lovell Sheehan, MD    04/11/2020 3:53 PM

## 2020-04-11 NOTE — ED Triage Notes (Signed)
Pt to ed via ems from [facility] for chief complaint of fall while bowling. Denies hitting head. Rotation and shortening noted to left leg.  Hx dementia. Disoriented to time 97% ra

## 2020-04-12 ENCOUNTER — Inpatient Hospital Stay: Payer: Medicare Other | Admitting: Anesthesiology

## 2020-04-12 ENCOUNTER — Encounter
Admission: EM | Disposition: A | Discharge status: Skilled Nursing Facility | Payer: Self-pay | Source: Skilled Nursing Facility | Attending: Family Medicine

## 2020-04-12 ENCOUNTER — Inpatient Hospital Stay: Payer: Medicare Other

## 2020-04-12 ENCOUNTER — Encounter: Payer: Self-pay | Admitting: Orthopedic Surgery

## 2020-04-12 HISTORY — PX: INTRAMEDULLARY (IM) NAIL INTERTROCHANTERIC: SHX5875

## 2020-04-12 LAB — CBC
HCT: 38.6 % — ABNORMAL LOW (ref 39.0–52.0)
Hemoglobin: 13.7 g/dL (ref 13.0–17.0)
MCH: 32.2 pg (ref 26.0–34.0)
MCHC: 35.5 g/dL (ref 30.0–36.0)
MCV: 90.6 fL (ref 80.0–100.0)
Platelets: 202 10*3/uL (ref 150–400)
RBC: 4.26 MIL/uL (ref 4.22–5.81)
RDW: 13.9 % (ref 11.5–15.5)
WBC: 11.1 10*3/uL — ABNORMAL HIGH (ref 4.0–10.5)
nRBC: 0 % (ref 0.0–0.2)

## 2020-04-12 LAB — BASIC METABOLIC PANEL
Anion gap: 10 (ref 5–15)
BUN: 22 mg/dL (ref 8–23)
CO2: 27 mmol/L (ref 22–32)
Calcium: 8.7 mg/dL — ABNORMAL LOW (ref 8.9–10.3)
Chloride: 98 mmol/L (ref 98–111)
Creatinine, Ser: 0.92 mg/dL (ref 0.61–1.24)
GFR, Estimated: >60 mL/min (ref >60)
Glucose, Bld: 132 mg/dL — ABNORMAL HIGH (ref 70–99)
Potassium: 3.2 mmol/L — ABNORMAL LOW (ref 3.5–5.1)
Sodium: 135 mmol/L (ref 135–145)

## 2020-04-12 SURGERY — FIXATION, FRACTURE, INTERTROCHANTERIC, WITH INTRAMEDULLARY ROD
Anesthesia: Spinal | Laterality: Left

## 2020-04-12 MED ORDER — PHENYLEPHRINE HCL (PRESSORS) 10 MG/ML IV SOLN
INTRAVENOUS | Status: DC | PRN
  Administered 2020-04-12 (×4): 50 ug via INTRAVENOUS

## 2020-04-12 MED ORDER — TRANEXAMIC ACID 1000 MG/10ML IV SOLN
INTRAVENOUS | Status: DC | PRN
  Administered 2020-04-12: 12:00:00 1000 mg via TOPICAL

## 2020-04-12 MED ORDER — DOCUSATE SODIUM 100 MG PO CAPS
100.0000 mg | ORAL_CAPSULE | Freq: Two times a day (BID) | ORAL | Status: DC
  Administered 2020-04-12 – 2020-04-15 (×6): 100 mg via ORAL
  Filled 2020-04-12 (×6): qty 1

## 2020-04-12 MED ORDER — ACETAMINOPHEN 325 MG PO TABS
325.0000 mg | ORAL_TABLET | Freq: Four times a day (QID) | ORAL | Status: DC | PRN
  Administered 2020-04-14: 325 mg via ORAL
  Filled 2020-04-12: qty 1

## 2020-04-12 MED ORDER — BUPIVACAINE-EPINEPHRINE (PF) 0.25% -1:200000 IJ SOLN
INTRAMUSCULAR | Status: DC | PRN
  Administered 2020-04-12: 30 mL

## 2020-04-12 MED ORDER — CEFAZOLIN SODIUM-DEXTROSE 2-4 GM/100ML-% IV SOLN
2.0000 g | Freq: Four times a day (QID) | INTRAVENOUS | Status: AC
  Administered 2020-04-12 – 2020-04-13 (×3): 2 g via INTRAVENOUS
  Filled 2020-04-12 (×3): qty 100

## 2020-04-12 MED ORDER — GLYCOPYRROLATE 0.2 MG/ML IJ SOLN
INTRAMUSCULAR | Status: DC | PRN
  Administered 2020-04-12: .2 mg via INTRAVENOUS

## 2020-04-12 MED ORDER — ADULT MULTIVITAMIN W/MINERALS CH
1.0000 | ORAL_TABLET | Freq: Every day | ORAL | Status: DC
  Administered 2020-04-13 – 2020-04-15 (×3): 1 via ORAL
  Filled 2020-04-12 (×3): qty 1

## 2020-04-12 MED ORDER — CHLORHEXIDINE GLUCONATE 0.12 % MT SOLN
15.0000 mL | Freq: Once | OROMUCOSAL | Status: AC
  Administered 2020-04-12: 11:00:00 15 mL via OROMUCOSAL

## 2020-04-12 MED ORDER — LIDOCAINE HCL (CARDIAC) PF 100 MG/5ML IV SOSY
PREFILLED_SYRINGE | INTRAVENOUS | Status: DC | PRN
  Administered 2020-04-12: 60 mg via INTRAVENOUS

## 2020-04-12 MED ORDER — BUPIVACAINE HCL (PF) 0.5 % IJ SOLN
INTRAMUSCULAR | Status: DC | PRN
  Administered 2020-04-12: 2.6 mL

## 2020-04-12 MED ORDER — LACTATED RINGERS IV SOLN: INTRAVENOUS | Status: DC | PRN

## 2020-04-12 MED ORDER — POTASSIUM CHLORIDE 10 MEQ/100ML IV SOLN
10.0000 meq | Freq: Once | INTRAVENOUS | Status: AC
  Administered 2020-04-12: 22:00:00 10 meq via INTRAVENOUS
  Filled 2020-04-12: qty 100

## 2020-04-12 MED ORDER — KETOROLAC TROMETHAMINE 15 MG/ML IJ SOLN
7.5000 mg | Freq: Four times a day (QID) | INTRAMUSCULAR | Status: AC
  Administered 2020-04-12 – 2020-04-13 (×4): 7.5 mg via INTRAVENOUS
  Filled 2020-04-12 (×4): qty 1

## 2020-04-12 MED ORDER — FENTANYL CITRATE (PF) 100 MCG/2ML IJ SOLN: 25.0000 ug | INTRAMUSCULAR | Status: DC | PRN

## 2020-04-12 MED ORDER — ENSURE ENLIVE PO LIQD
237.0000 mL | Freq: Two times a day (BID) | ORAL | Status: DC
  Administered 2020-04-13 – 2020-04-15 (×5): 237 mL via ORAL

## 2020-04-12 MED ORDER — BUPIVACAINE HCL (PF) 0.5 % IJ SOLN
INTRAMUSCULAR | Status: AC
  Filled 2020-04-12: qty 10

## 2020-04-12 MED ORDER — CHLORHEXIDINE GLUCONATE 0.12 % MT SOLN
OROMUCOSAL | Status: AC
  Filled 2020-04-12: qty 15

## 2020-04-12 MED ORDER — ONDANSETRON HCL 4 MG PO TABS: 4.0000 mg | ORAL_TABLET | Freq: Four times a day (QID) | ORAL | Status: DC | PRN

## 2020-04-12 MED ORDER — ENOXAPARIN SODIUM 40 MG/0.4ML ~~LOC~~ SOLN
40.0000 mg | SUBCUTANEOUS | Status: DC
  Administered 2020-04-13 – 2020-04-15 (×3): 40 mg via SUBCUTANEOUS
  Filled 2020-04-12 (×3): qty 0.4

## 2020-04-12 MED ORDER — METOCLOPRAMIDE HCL 5 MG/ML IJ SOLN: 5.0000 mg | Freq: Three times a day (TID) | INTRAMUSCULAR | Status: DC | PRN

## 2020-04-12 MED ORDER — DEXMEDETOMIDINE (PRECEDEX) IN NS 20 MCG/5ML (4 MCG/ML) IV SYRINGE
PREFILLED_SYRINGE | INTRAVENOUS | Status: DC | PRN
  Administered 2020-04-12: 8 ug via INTRAVENOUS
  Administered 2020-04-12: 12 ug via INTRAVENOUS

## 2020-04-12 MED ORDER — ONDANSETRON HCL 4 MG/2ML IJ SOLN: 4.0000 mg | Freq: Four times a day (QID) | INTRAMUSCULAR | Status: DC | PRN

## 2020-04-12 MED ORDER — EPINEPHRINE PF 1 MG/ML IJ SOLN
INTRAMUSCULAR | Status: DC | PRN
  Administered 2020-04-12 (×2): .01 mg via INTRAVENOUS

## 2020-04-12 MED ORDER — METOCLOPRAMIDE HCL 10 MG PO TABS: 5.0000 mg | ORAL_TABLET | Freq: Three times a day (TID) | ORAL | Status: DC | PRN

## 2020-04-12 MED ORDER — PROPOFOL 10 MG/ML IV BOLUS
INTRAVENOUS | Status: DC | PRN
  Administered 2020-04-12: 50 mg via INTRAVENOUS
  Administered 2020-04-12: 20 mg via INTRAVENOUS

## 2020-04-12 MED ORDER — DEXMEDETOMIDINE (PRECEDEX) IN NS 20 MCG/5ML (4 MCG/ML) IV SYRINGE
PREFILLED_SYRINGE | INTRAVENOUS | Status: AC
  Filled 2020-04-12: qty 5

## 2020-04-12 MED ORDER — OXYCODONE HCL 5 MG/5ML PO SOLN: 5.0000 mg | Freq: Once | ORAL | Status: DC | PRN

## 2020-04-12 MED ORDER — POTASSIUM CHLORIDE 10 MEQ/100ML IV SOLN
10.0000 meq | INTRAVENOUS | Status: AC
  Administered 2020-04-12: 09:00:00 10 meq via INTRAVENOUS
  Filled 2020-04-12: qty 100

## 2020-04-12 MED ORDER — PROPOFOL 500 MG/50ML IV EMUL
INTRAVENOUS | Status: DC | PRN
  Administered 2020-04-12: 50 ug/kg/min via INTRAVENOUS

## 2020-04-12 MED ORDER — GLYCOPYRROLATE 0.2 MG/ML IJ SOLN
INTRAMUSCULAR | Status: AC
  Filled 2020-04-12: qty 1

## 2020-04-12 MED ORDER — OXYCODONE HCL 5 MG PO TABS: 5.0000 mg | ORAL_TABLET | Freq: Once | ORAL | Status: DC | PRN

## 2020-04-12 SURGICAL SUPPLY — 36 items
BLADE TFNA HELICAL 105 STRL (Anchor) ×3 IMPLANT
BNDG COHESIVE 4X5 TAN STRL (GAUZE/BANDAGES/DRESSINGS) IMPLANT
BRUSH SCRUB EZ  4% CHG (MISCELLANEOUS) ×4
BRUSH SCRUB EZ 4% CHG (MISCELLANEOUS) ×2 IMPLANT
CANISTER SUCT 1200ML W/VALVE (MISCELLANEOUS) ×3 IMPLANT
CHLORAPREP W/TINT 26 (MISCELLANEOUS) ×3 IMPLANT
COVER WAND RF STERILE (DRAPES) ×3 IMPLANT
DRAPE 3/4 80X56 (DRAPES) ×3 IMPLANT
DRAPE U-SHAPE 47X51 STRL (DRAPES) ×3 IMPLANT
DRSG AQUACEL AG ADV 3.5X 4 (GAUZE/BANDAGES/DRESSINGS) IMPLANT
DRSG AQUACEL AG ADV 3.5X10 (GAUZE/BANDAGES/DRESSINGS) ×3 IMPLANT
ELECT REM PT RETURN 9FT ADLT (ELECTROSURGICAL) ×3
ELECTRODE REM PT RTRN 9FT ADLT (ELECTROSURGICAL) ×1 IMPLANT
GAUZE XEROFORM 1X8 LF (GAUZE/BANDAGES/DRESSINGS) ×3 IMPLANT
GLOVE INDICATOR 8.0 STRL GRN (GLOVE) ×3 IMPLANT
GLOVE SURG ORTHO 8.0 STRL STRW (GLOVE) ×3 IMPLANT
GOWN STRL REUS W/ TWL LRG LVL3 (GOWN DISPOSABLE) ×1 IMPLANT
GOWN STRL REUS W/ TWL XL LVL3 (GOWN DISPOSABLE) ×1 IMPLANT
GOWN STRL REUS W/TWL LRG LVL3 (GOWN DISPOSABLE) ×2
GOWN STRL REUS W/TWL XL LVL3 (GOWN DISPOSABLE) ×2
GUIDEWIRE 3.2X400 (WIRE) ×3 IMPLANT
KIT PATIENT CARE HANA TABLE (KITS) ×3 IMPLANT
KIT TURNOVER CYSTO (KITS) ×3 IMPLANT
MANIFOLD NEPTUNE II (INSTRUMENTS) ×3 IMPLANT
MAT ABSORB  FLUID 56X50 GRAY (MISCELLANEOUS) ×2
MAT ABSORB FLUID 56X50 GRAY (MISCELLANEOUS) ×1 IMPLANT
NAIL CANN TFNA 9 130D 380 (Nail) ×3 IMPLANT
NEEDLE SPNL 20GX3.5 QUINCKE YW (NEEDLE) ×3 IMPLANT
NS IRRIG 1000ML POUR BTL (IV SOLUTION) ×3 IMPLANT
PACK HIP COMPR (MISCELLANEOUS) ×3 IMPLANT
STAPLER SKIN PROX 35W (STAPLE) ×3 IMPLANT
SUT VIC AB 0 CT1 36 (SUTURE) ×3 IMPLANT
SUT VIC AB 2-0 CT1 27 (SUTURE) ×2
SUT VIC AB 2-0 CT1 TAPERPNT 27 (SUTURE) ×1 IMPLANT
SYR 30ML LL (SYRINGE) ×3 IMPLANT
TOWEL OR 17X26 4PK STRL BLUE (TOWEL DISPOSABLE) ×3 IMPLANT

## 2020-04-12 NOTE — Op Note (Signed)
DATE OF SURGERY:  04/12/2020  TIME: 12:33 PM  PATIENT NAME:  Carlyle Dolly  AGE: 86 y.o.  PRE-OPERATIVE DIAGNOSIS:  Left Hip Fracture  POST-OPERATIVE DIAGNOSIS:  SAME  PROCEDURE:  LEFT INTRAMEDULLARY (IM) NAIL INTERTROCHANTRIC  SURGEON:  Lovell Sheehan  EBL:  50 cc  COMPLICATIONS:  None apparent  OPERATIVE IMPLANTS: Synthes trochanteric femoral nail  380 mm by 9 mm  with interlocking helical blade  950 mm  PREOPERATIVE INDICATIONS:  CRUE OTERO is a 86 y.o. year old who fell and suffered a hip fracture. He was brought into the ER and then admitted and optimized and then elected for surgical intervention.    The risks benefits and alternatives were discussed with the patient including but not limited to the risks of nonoperative treatment, versus surgical intervention including infection, bleeding, nerve injury, malunion, nonunion, hardware prominence, hardware failure, need for hardware removal, blood clots, cardiopulmonary complications, morbidity, mortality, among others, and they were willing to proceed.    OPERATIVE PROCEDURE:  The patient was brought to the operating room and placed in the supine position.  Spinal anesthesia was administered. He was placed on the fracture table.  Closed reduction was performed under C-arm guidance. The length of the femur was also measured using fluoroscopy. Time out was then performed after sterile prep and drape. He received preoperative antibiotics.  Incision was made proximal to the greater trochanter. A guidewire was placed in the appropriate position. Confirmation was made on AP and lateral views. The above-named nail was opened. I opened the proximal femur with a reamer. I then placed the nail by hand easily down. I did not need to ream the femur.  Once the nail was completely seated, I placed a guidepin into the femoral head into the center center position through a second incision.  I measured the length, and then reamed the lateral  cortex and up into the head. I then placed the helical blade. Slight compression was applied. Anatomic fixation achieved. Bone quality was poor.  I then secured the proximal interlock.  I then removed the instruments, and took final C-arm pictures AP and lateral the entire length of the leg. Anatomic reconstruction was achieved, and the wounds were irrigated copiously and closed with Vicryl  followed by staples and dry sterile dressing. Sponge and needle count were correct.   The patient was awakened and returned to PACU in stable and satisfactory condition. There no complications and the patient tolerated the procedure well.  He will be weightbearing as tolerated.    Lovell Sheehan

## 2020-04-12 NOTE — Transfer of Care (Signed)
Immediate Anesthesia Transfer of Care Note  Patient: CHEVY SWEIGERT  Procedure(s) Performed: INTRAMEDULLARY (IM) NAIL INTERTROCHANTRIC (Left )  Patient Location: PACU  Anesthesia Type:Spinal  Level of Consciousness: drowsy and patient cooperative  Airway & Oxygen Therapy: Patient Spontanous Breathing and Patient connected to nasal cannula oxygen  Post-op Assessment: Report given to RN and Post -op Vital signs reviewed and stable  Post vital signs: Reviewed and stable  Last Vitals:  Vitals Value Taken Time  BP 94/44 04/12/20 1234  Temp    Pulse 47 04/12/20 1234  Resp 10 04/12/20 1234  SpO2 100 % 04/12/20 1234  Vitals shown include unvalidated device data.  Last Pain:  Vitals:   04/12/20 1044  TempSrc: Oral  PainSc: 3          Complications: No complications documented.

## 2020-04-12 NOTE — Progress Notes (Signed)
Initial Nutrition Assessment  DOCUMENTATION CODES:   Non-severe (moderate) malnutrition in context of chronic illness  INTERVENTION:   Once diet advanced provide:  Ensure Enlive po BID, each supplement provides 350 kcal and 20 grams of protein  MVI with minerals  Recommend putting patient on least restrictive diet when able to be advanced.     NUTRITION DIAGNOSIS:   Moderate Malnutrition related to chronic illness (dementia and CKD stage 3) as evidenced by mild fat depletion,mild muscle depletion,moderate muscle depletion.   GOAL:   Patient will meet greater than or equal to 90% of their needs   MONITOR:   PO intake,Supplement acceptance,Labs,Weight trends  REASON FOR ASSESSMENT:   Consult Hip fracture protocol  ASSESSMENT:   86 y.o. male presenting from SNF with PMH of HTN, dementia, stage 3 CKD, WPWS. Pt admitted for L hip fracture experienced while bowling. Per nursing home staff, he did not hit his head or lose consciousness. IM nail on 12/21.   Spoke with pt at bedside. Pt with mild confusion present.   Per pt, he consumes 3 meals a day and likes to snack. He reports his appetite is good at home. Pt is currently NPO at this time. No documented meal records in chart. Per chart from SNF, pt is able to feed self but no further details provided. Pt denies difficulty chewing and swallowing.   Pt reports no recent weight loss. RD obtained new weight of 78.9 kg via bed scale during exam. Per chart review, pt has lost 3 kg since 04/16/19, indicating a 3.6% weight loss within a year, which is not significant for time frame.   Labs reviewed: Potassium 3.2  Medications reviewed and include: IV KCl    NUTRITION - FOCUSED PHYSICAL EXAM:  Flowsheet Row Most Recent Value  Orbital Region Mild depletion  Upper Arm Region Moderate depletion  Thoracic and Lumbar Region No depletion  Buccal Region Mild depletion  Temple Region Mild depletion  Clavicle Bone Region Mild  depletion  Clavicle and Acromion Bone Region Mild depletion  Scapular Bone Region Unable to assess  Dorsal Hand No depletion  Patellar Region Moderate depletion  Anterior Thigh Region Moderate depletion  Posterior Calf Region Unable to assess  Edema (RD Assessment) None  Hair Reviewed  Eyes Reviewed  Mouth Unable to assess  Skin Reviewed  Nails Reviewed       Diet Order:   Diet Order            Diet NPO time specified  Diet effective midnight                 EDUCATION NEEDS:   No education needs have been identified at this time  Skin:  Skin Assessment: Reviewed RN Assessment  Last BM:  unknown  Height:   Ht Readings from Last 1 Encounters:  04/11/20 5\' 10"  (1.778 m)    Weight:   Wt Readings from Last 1 Encounters:  04/12/20 78.9 kg     BMI:  Body mass index is 24.96 kg/m.  Estimated Nutritional Needs:   Kcal:  1900-2100  Protein:  95-105 grams  Fluid:  >1.9 L/day    Ronnald Nian, Dietetic Intern Pager: [phone] If unavailable: [phone]

## 2020-04-12 NOTE — Progress Notes (Signed)
PROGRESS NOTE    Michael Kane  IRJ:[id number] DOB: 08/31/1928 DOA: 04/11/2020 PCP: Venia Carbon, MD   Chief Complain: Fall, left hip pain  Brief Narrative:  Patient is a 86 year old male with history of hypertension, dementia, stage IIIa CKD, WPW syndrome who presents to the emergency department after a fall at nursing facility.  He complained of left hip pain after the fall.  He was unable to get up after falling due to significant pain in the left hip.  There was no report of loss of consciousness or head injury.  Left hip x-ray showed intertrochanteric fracture.  Orthopedics consulted.  Planning for ORIF today.  Assessment & Plan:   Principal Problem:   Closed left hip fracture (HCC) Active Problems:   Vascular dementia ([place])   Essential hypertension, benign   Aortic stenosis   Stage 3a chronic kidney disease ([place])   Depression   Fall/left hip fracture: Orthopedics consulted.  X-ray showed left intertrochanteric fracture.  Orthopedics planning for ORIF.  Continue pain management, supportive care.  DVT prophylaxis with Lovenox after the surgery.  PT/OT consultation after the surgery.  TOC will be consulted  Hypertension: Blood pressure fluctuating.  Continue to monitor blood pressure.  On amlodipine and hydrochlorothiazide.  Stage III a CKD: Currently kidney function at baseline.  Dementia/depression: Continue Zoloft and Namenda. Delirium  precautions.  Continue supportive care.  Hypokalemia: We will supplement with potassium.  Check BMP tomorrow         DVT prophylaxis:SCD Code Status: DNR Family Communication: None at bedside Status is: Inpatient  Remains inpatient appropriate because:Inpatient level of care appropriate due to severity of illness   Dispo: The patient is from: SNF              Anticipated d/c is to: SNF              Anticipated d/c date is: 2 days              Patient currently is not medically stable to d/c.     Consultants:  orthopedics  Procedures: Plan for ORIF  Antimicrobials:  Anti-infectives (From admission, onward)   Start     Dose/Rate Route Frequency Ordered Stop   04/11/20 1555  ceFAZolin (ANCEF) IVPB 2g/100 mL premix        2 g 200 mL/hr over 30 Minutes Intravenous 30 min pre-op 04/11/20 1556        Subjective: Patient seen and examined at the bedside this morning.  He was lying on the bed.  He did not look uncomfortable, comfortable but was very hard of hearing and was confused.  Objective: Vitals:   04/11/20 1506 04/11/20 2017 04/11/20 2334 04/12/20 0748  BP: (!) 165/75 (!) 154/75 137/73 (!) 158/76  Pulse: 77 97 97 74  Resp: 16 17 16 15   Temp: 97.6 F (36.4 C) 98 F (36.7 C) 97.7 F (36.5 C) 98.2 F (36.8 C)  TempSrc:   Oral   SpO2: 96% 94% 90% 93%  Weight:      Height:        Intake/Output Summary (Last 24 hours) at 04/12/2020 0820 Last data filed at 04/12/2020 3016 Gross per 24 hour  Intake --  Output 275 ml  Net -275 ml   Filed Weights   04/11/20 1204  Weight: 81.6 kg    Examination:  General exam: Very pleasant elderly gentleman Respiratory system: Bilateral equal air entry, normal vesicular breath sounds, no wheezes or crackles  Cardiovascular system: S1 &  S2 heard, RRR. No JVD, murmurs, rubs, gallops or clicks. No pedal edema. Gastrointestinal system: Abdomen is nondistended, soft and nontender. No organomegaly or masses felt. Normal bowel sounds heard. Central nervous system: Alert and oriented. No focal neurological deficits. Extremities: No edema, no clubbing ,no cyanosis, tenderness of the left hip  skin: No rashes, lesions or ulcers,no icterus ,no pallor   Data Reviewed: I have personally reviewed following labs and imaging studies  CBC: Recent Labs  Lab 04/11/20 1206 04/12/20 0539  WBC 8.6 11.1*  NEUTROABS 4.6  --   HGB 15.3 13.7  HCT 44.0 38.6*  MCV 91.9 90.6  PLT 228 160   Basic Metabolic Panel: Recent Labs  Lab 04/11/20 1206  04/12/20 0539  NA 138 135  K 3.8 3.2*  CL 100 98  CO2 26 27  GLUCOSE 111* 132*  BUN 24* 22  CREATININE 1.13 0.92  CALCIUM 9.5 8.7*   GFR: Estimated Creatinine Clearance: 56.2 mL/min (by C-G formula based on SCr of 0.92 mg/dL). Liver Function Tests: No results for input(s): AST, ALT, ALKPHOS, BILITOT, PROT, ALBUMIN in the last 168 hours. No results for input(s): LIPASE, AMYLASE in the last 168 hours. No results for input(s): AMMONIA in the last 168 hours. Coagulation Profile: No results for input(s): INR, PROTIME in the last 168 hours. Cardiac Enzymes: No results for input(s): CKTOTAL, CKMB, CKMBINDEX, TROPONINI in the last 168 hours. BNP (last 3 results) No results for input(s): PROBNP in the last 8760 hours. HbA1C: No results for input(s): HGBA1C in the last 72 hours. CBG: No results for input(s): GLUCAP in the last 168 hours. Lipid Profile: No results for input(s): CHOL, HDL, LDLCALC, TRIG, CHOLHDL, LDLDIRECT in the last 72 hours. Thyroid Function Tests: No results for input(s): TSH, T4TOTAL, FREET4, T3FREE, THYROIDAB in the last 72 hours. Anemia Panel: No results for input(s): VITAMINB12, FOLATE, FERRITIN, TIBC, IRON, RETICCTPCT in the last 72 hours. Sepsis Labs: No results for input(s): PROCALCITON, LATICACIDVEN in the last 168 hours.  Recent Results (from the past 240 hour(s))  Resp Panel by RT-PCR (Flu A&B, Covid) Nasopharyngeal Swab     Status: None   Collection Time: 04/11/20 12:06 PM   Specimen: Nasopharyngeal Swab; Nasopharyngeal(NP) swabs in vial transport medium  Result Value Ref Range Status   SARS Coronavirus 2 by RT PCR NEGATIVE NEGATIVE Final    Comment: (NOTE) SARS-CoV-2 target nucleic acids are NOT DETECTED.  The SARS-CoV-2 RNA is generally detectable in upper respiratory specimens during the acute phase of infection. The lowest concentration of SARS-CoV-2 viral copies this assay can detect is 138 copies/mL. A negative result does not preclude  SARS-Cov-2 infection and should not be used as the sole basis for treatment or other patient management decisions. A negative result may occur with  improper specimen collection/handling, submission of specimen other than nasopharyngeal swab, presence of viral mutation(s) within the areas targeted by this assay, and inadequate number of viral copies(<138 copies/mL). A negative result must be combined with clinical observations, patient history, and epidemiological information. The expected result is Negative.  Fact Sheet for Patients:  EntrepreneurPulse.com.au  Fact Sheet for Healthcare Providers:  IncredibleEmployment.be  This test is no t yet approved or cleared by the [place] FDA and  has been authorized for detection and/or diagnosis of SARS-CoV-2 by FDA under an Emergency Use Authorization (EUA). This EUA will remain  in effect (meaning this test can be used) for the duration of the COVID-19 declaration under Section 564(b)(1) of the Act, 21 U.S.C.section 360bbb-3(b)(1),  unless the authorization is terminated  or revoked sooner.       Influenza A by PCR NEGATIVE NEGATIVE Final   Influenza B by PCR NEGATIVE NEGATIVE Final    Comment: (NOTE) The Xpert Xpress SARS-CoV-2/FLU/RSV plus assay is intended as an aid in the diagnosis of influenza from Nasopharyngeal swab specimens and should not be used as a sole basis for treatment. Nasal washings and aspirates are unacceptable for Xpert Xpress SARS-CoV-2/FLU/RSV testing.  Fact Sheet for Patients: EntrepreneurPulse.com.au  Fact Sheet for Healthcare Providers: IncredibleEmployment.be  This test is not yet approved or cleared by the [place] FDA and has been authorized for detection and/or diagnosis of SARS-CoV-2 by FDA under an Emergency Use Authorization (EUA). This EUA will remain in effect (meaning this test can be used) for the duration of  the COVID-19 declaration under Section 564(b)(1) of the Act, 21 U.S.C. section 360bbb-3(b)(1), unless the authorization is terminated or revoked.  Performed at [facility], [address] [place], [place] [address]   MRSA PCR Screening     Status: None   Collection Time: 04/11/20  4:49 PM   Specimen: Nasopharyngeal  Result Value Ref Range Status   MRSA by PCR NEGATIVE NEGATIVE Final    Comment:        The GeneXpert MRSA Assay (FDA approved for NASAL specimens only), is one component of a comprehensive MRSA colonization surveillance program. It is not intended to diagnose MRSA infection nor to guide or monitor treatment for MRSA infections. Performed at [facility], [address], [place], [place] [address]          Radiology Studies: DG Chest 1 View  Result Date: 04/11/2020 CLINICAL DATA:  Recent fall with known left hip fracture EXAM: CHEST  1 VIEW COMPARISON:  None. FINDINGS: Cardiac shadow is within normal limits. Aortic calcifications are noted. Mild scarring is noted in the bases bilaterally. No acute bony abnormality is seen. IMPRESSION: Mild bibasilar scarring.  No acute abnormality noted. Electronically Signed   By: Inez Catalina M.D.   On: 04/11/2020 13:34   DG Hip Unilat W or Wo Pelvis 2-3 Views Left  Result Date: 04/11/2020 CLINICAL DATA:  Recent fall with leg pain, initial encounter EXAM: DG HIP (WITH OR WITHOUT PELVIS) 2-3V LEFT COMPARISON:  None. FINDINGS: Pelvic ring is intact. Left intratrochanteric fracture is noted with impaction and angulation at the fracture site. No other focal abnormality is noted. IMPRESSION: Left intratrochanteric fracture as described. Electronically Signed   By: Inez Catalina M.D.   On: 04/11/2020 13:30        Scheduled Meds: . amLODipine  10 mg Oral Daily  . cholestyramine  4 g Oral QHS  . hydrochlorothiazide  25 mg Oral Daily  . memantine  5 mg Oral BID  . senna  1 tablet Oral BID  . sertraline  25  mg Oral Daily   Continuous Infusions: .  ceFAZolin (ANCEF) IV    . methocarbamol (ROBAXIN) IV    . tranexamic acid       LOS: 1 day    Time spent:25 mins, More than 50% of that time was spent in counseling and/or coordination of care.      Shelly Coss, MD [facility] P12/21/2021, 8:20 AM

## 2020-04-12 NOTE — Anesthesia Procedure Notes (Addendum)
Spinal  Patient location during procedure: OR End time: 04/12/2020 11:38 AM Staffing Performed: resident/CRNA  Anesthesiologist: Tera Mater, MD Resident/CRNA: Jonna Clark, CRNA Preanesthetic Checklist Completed: patient identified, IV checked, site marked, risks and benefits discussed, surgical consent, monitors and equipment checked, pre-op evaluation and timeout performed Spinal Block Patient position: left lateral decubitus Prep: Betadine Patient monitoring: heart rate, continuous pulse ox, blood pressure and cardiac monitor Approach: midline Location: L4-5 Injection technique: single-shot Needle Needle type: [place]acre and Introducer  Needle gauge: 24 G Needle length: 9 cm Additional Notes Negative paresthesia. Negative blood return. Positive free-flowing CSF. Expiration date of kit checked and confirmed. Patient tolerated procedure well, without complications.

## 2020-04-13 DIAGNOSIS — E440 Moderate protein-calorie malnutrition: Secondary | ICD-10-CM | POA: Insufficient documentation

## 2020-04-13 LAB — BASIC METABOLIC PANEL
Anion gap: 11 (ref 5–15)
BUN: 16 mg/dL (ref 8–23)
CO2: 26 mmol/L (ref 22–32)
Calcium: 8.6 mg/dL — ABNORMAL LOW (ref 8.9–10.3)
Chloride: 97 mmol/L — ABNORMAL LOW (ref 98–111)
Creatinine, Ser: 0.83 mg/dL (ref 0.61–1.24)
GFR, Estimated: >60 mL/min (ref >60)
Glucose, Bld: 128 mg/dL — ABNORMAL HIGH (ref 70–99)
Potassium: 3.2 mmol/L — ABNORMAL LOW (ref 3.5–5.1)
Sodium: 134 mmol/L — ABNORMAL LOW (ref 135–145)

## 2020-04-13 LAB — CBC WITH DIFFERENTIAL/PLATELET
Abs Immature Granulocytes: 0.06 10*3/uL (ref 0.00–0.07)
Basophils Absolute: 0 10*3/uL (ref 0.0–0.1)
Basophils Relative: 0 %
Eosinophils Absolute: 0 10*3/uL (ref 0.0–0.5)
Eosinophils Relative: 0 %
HCT: 38 % — ABNORMAL LOW (ref 39.0–52.0)
Hemoglobin: 13.1 g/dL (ref 13.0–17.0)
Immature Granulocytes: 1 %
Lymphocytes Relative: 12 %
Lymphs Abs: 1.3 10*3/uL (ref 0.7–4.0)
MCH: 31.6 pg (ref 26.0–34.0)
MCHC: 34.5 g/dL (ref 30.0–36.0)
MCV: 91.6 fL (ref 80.0–100.0)
Monocytes Absolute: 1.3 10*3/uL — ABNORMAL HIGH (ref 0.1–1.0)
Monocytes Relative: 11 %
Neutro Abs: 8.4 10*3/uL — ABNORMAL HIGH (ref 1.7–7.7)
Neutrophils Relative %: 76 %
Platelets: 183 10*3/uL (ref 150–400)
RBC: 4.15 MIL/uL — ABNORMAL LOW (ref 4.22–5.81)
RDW: 13.6 % (ref 11.5–15.5)
WBC: 11.1 10*3/uL — ABNORMAL HIGH (ref 4.0–10.5)
nRBC: 0 % (ref 0.0–0.2)

## 2020-04-13 MED ORDER — CHLORHEXIDINE GLUCONATE CLOTH 2 % EX PADS
6.0000 | MEDICATED_PAD | Freq: Every day | CUTANEOUS | Status: DC
  Administered 2020-04-13 – 2020-04-15 (×3): 6 via TOPICAL

## 2020-04-13 MED ORDER — POTASSIUM CHLORIDE 20 MEQ PO PACK
40.0000 meq | PACK | Freq: Once | ORAL | Status: AC
  Administered 2020-04-13: 09:00:00 40 meq via ORAL
  Filled 2020-04-13: qty 2

## 2020-04-13 MED ORDER — HALOPERIDOL LACTATE 5 MG/ML IJ SOLN
2.0000 mg | Freq: Once | INTRAMUSCULAR | Status: AC
  Administered 2020-04-13: 03:00:00 2 mg via INTRAMUSCULAR
  Filled 2020-04-13: qty 1

## 2020-04-13 NOTE — Evaluation (Signed)
Occupational Therapy Evaluation Patient Details Name: Michael Kane MRN: [id number] DOB: 1928/07/26 Today's Date: 04/13/2020    History of Present Illness Pt is 86 y/o M with PMH: HTN, dementia, stage IIIa CKD, WPW syndrome who presents to the emergency department after a fall at nursing facility with c/o L hip pain. Pt now s/p L hip IM nailing.   Clinical Impression   Pt seen for OT evaluation this date in setting of acute hospitalization d/t fall, now s/p L hip IM nailing. Pt reports he is INDEP at baseline, but is questionable historian given his baseline cognition. Pt requires MOD A for CTS on second trial (unsuccessful first trial) with RW and cues for sequence/hand placement. Pt requires MAX A for LB dressing and MIN A for UB dressing. Pt is high fall risk at this time and would benefit from continued skilled OT in acute setting. In addition, anticipate pt will require OT f/u at [facility] in SNF setting upon d/c from acute setting for safety and to restore function to highest possible performance level.     Follow Up Recommendations  SNF    Equipment Recommendations  Other (comment) (defer to next level of care)    Recommendations for Other Services       Precautions / Restrictions Precautions Precautions: Fall Restrictions Weight Bearing Restrictions: Yes LLE Weight Bearing: Weight bearing as tolerated      Mobility Bed Mobility               General bed mobility comments: pt up to chair when OT presents    Transfers Overall transfer level: Needs assistance Equipment used: Rolling walker (2 wheeled) Transfers: Sit to/from Stand Sit to Stand: Mod assist         General transfer comment: Pt requires 2 trials to CTS from recliner with RW as well as rocking and 3-count to gain momentum.    Balance Overall balance assessment: Needs assistance   Sitting balance-Leahy Scale: Fair       Standing balance-Leahy Scale: Poor Standing balance comment: requires  MIN/MOD A with B UE support to suistain static stand                           ADL either performed or assessed with clinical judgement   ADL                                         General ADL Comments: Pt requires MAX A for seated LB ADLs, MIN A for seated UB ADLs as well as MIN verbal/tactile cues to sequence. Pt requires MOD A to CTS with RW from recliner.     Vision Baseline Vision/History: Wears glasses Wears Glasses: At all times Patient Visual Report: No change from baseline       Perception     Praxis      Pertinent Vitals/Pain Pain Assessment: Faces Faces Pain Scale: Hurts even more Pain Location: L hip Pain Descriptors / Indicators: Aching;Tender Pain Intervention(s): Limited activity within patient's tolerance;Monitored during session;Repositioned     Hand Dominance     Extremity/Trunk Assessment Upper Extremity Assessment Upper Extremity Assessment: Generalized weakness   Lower Extremity Assessment Lower Extremity Assessment: Generalized weakness;LLE deficits/detail LLE: Unable to fully assess due to pain       Communication Communication Communication: HOH   Cognition Arousal/Alertness: Awake/alert Behavior During Therapy: WFL for  tasks assessed/performed Overall Cognitive Status: History of cognitive impairments - at baseline                                 General Comments: Pt oriented to self only, does not guess that he is in hospital when given 3 options to choose from. Pt PLOF information is questionable as he reports living home alone, but chart review indicates facility. Pt able to follow simple one-step commands when given increased time.   General Comments       Exercises Other Exercises Other Exercises: OT facilitates ed re: role of OT, but pt with poor capacity for carryover. OT provides setup in the room for optimal safety and encourages pt use of call Font and not to get up I'ly at this time.  Pt confirms understanding. Chair alarm in place, but batteries dead and non in storage closet. charge Therapist, sports and CNA notified.   Shoulder Instructions      Home Living Family/patient expects to be discharged to:: Skilled nursing facility                                        Prior Functioning/Environment Level of Independence: Needs assistance        Comments: pt is poor historinan d/t cognition. Reports he was able to walk with no walker, but then recognized device when prompted. Pt reports he is able to do his basic self care and came from home, but chart review reveals he came from a facility.        OT Problem List: Decreased strength;Impaired balance (sitting and/or standing);Decreased cognition;Decreased knowledge of precautions;Pain;Decreased range of motion;Decreased activity tolerance;Decreased safety awareness;Decreased knowledge of use of DME or AE      OT Treatment/Interventions: Self-care/ADL training;DME and/or AE instruction;Therapeutic activities;Balance training;Therapeutic exercise;Patient/family education    OT Goals(Current goals can be found in the care plan section) Acute Rehab OT Goals Patient Stated Goal: to get stronger OT Goal Formulation: With patient Time For Goal Achievement: 04/27/20 Potential to Achieve Goals: Fair ADL Goals Pt Will Perform Lower Body Dressing: with min assist;with caregiver independent in assisting;with adaptive equipment;sit to/from stand (with LRAD To stand) Pt Will Transfer to Toilet: with min assist;ambulating;bedside commode (with LRAD to [facility] at least ~10' away to improve safety for functional distances) Pt Will Perform Toileting - Clothing Manipulation and hygiene: with min assist;sit to/from stand Pt/caregiver will Perform Home Exercise Program: Increased strength;Both right and left upper extremity;With minimal assist  OT Frequency: Min 1X/week   Barriers to D/C:            Co-evaluation               AM-PAC OT "6 Clicks" Daily Activity     Outcome Measure Help from another person eating meals?: None Help from another person taking care of personal grooming?: A Little Help from another person toileting, which includes using toliet, bedpan, or urinal?: A Lot Help from another person bathing (including washing, rinsing, drying)?: A Lot Help from another person to put on and taking off regular upper body clothing?: A Little Help from another person to put on and taking off regular lower body clothing?: A Lot 6 Click Score: 16   End of Session Equipment Utilized During Treatment: Gait belt;Rolling walker Nurse Communication: Mobility status;Other (comment) (notified about chair alarm needing  AA batterires replaced)  Activity Tolerance: Patient tolerated treatment well;Patient limited by pain Patient left: in chair;with call Streng/phone within reach;with chair alarm set (chair alarm under patient and set, but batteries dead)  OT Visit Diagnosis: Unsteadiness on feet (R26.81);Muscle weakness (generalized) (M62.81);Pain Pain - Right/Left: Left Pain - part of body: Hip                Time: 7893-8101 OT Time Calculation (min): 38 min Charges:  OT General Charges $OT Visit: 1 Visit OT Evaluation $OT Eval Moderate Complexity: 1 Mod OT Treatments $Self Care/Home Management : 8-22 mins  Gerrianne Scale, MS, OTR/L ascom [phone] 04/13/20, 3:20 PM

## 2020-04-13 NOTE — Anesthesia Postprocedure Evaluation (Signed)
Anesthesia Post Note  Patient: DAEMION MCNIEL  Procedure(s) Performed: INTRAMEDULLARY (IM) NAIL INTERTROCHANTRIC (Left )  Patient location during evaluation: Nursing Unit Anesthesia Type: Spinal Level of consciousness: oriented and awake and alert Pain management: pain level controlled Vital Signs Assessment: post-procedure vital signs reviewed and stable Respiratory status: spontaneous breathing, respiratory function stable and patient connected to nasal cannula oxygen Cardiovascular status: blood pressure returned to baseline and stable Postop Assessment: no headache, no backache and no apparent nausea or vomiting Anesthetic complications: no   No complications documented.   Last Vitals:  Vitals:   04/12/20 2332 04/13/20 0342  BP: (!) 166/68 (!) 161/88  Pulse: 70 91  Resp: 17 19  Temp: 37.1 C 36.9 C  SpO2: 93% 91%    Last Pain:  Vitals:   04/12/20 1659  TempSrc: Oral  PainSc:                  Lia Foyer

## 2020-04-13 NOTE — Evaluation (Signed)
Physical Therapy Evaluation Patient Details Name: Michael Kane MRN: [id number] DOB: [birth year] Today's Date: 04/13/2020   History of Present Illness  Pt is 86 y/o M with PMH: HTN, dementia, stage IIIa CKD, WPW syndrome who presents to the emergency department after a fall at nursing facility with c/o L hip pain. Pt now s/p L hip IM nailing (04/12/20), WBAT.  Clinical Impression  Patient sleeping upon arrival to room, but awakens with mod voice/light touch.  Oriented to self and general situation, but does require intermittent reorientation to location, specifics of situation throughout session.  Follows commands, pleasant and cooperative; very HOH.  Pain appears well-controlled, FACES 4/10.  Currently requiring mod/max assist for bed mobility; close sup for unsupported sitting balance; min/mod assist +1-2 for sit/stand, basic transfers and short-distance gait (5') from bed/recliner with RW.  Demonstrates short, shuffling steps; limited WBing L LE, mild/mod buckling with loading. Poor balance, limited activity tolerance Would benefit from skilled PT to address above deficits and promote optimal return to PLOF.; recommend transition to STR upon discharge from acute hospitalization.     Follow Up Recommendations SNF    Equipment Recommendations  Rolling walker with 5" wheels;3in1 (PT)    Recommendations for Other Services       Precautions / Restrictions Precautions Precautions: Fall Restrictions Weight Bearing Restrictions: Yes LLE Weight Bearing: Weight bearing as tolerated      Mobility  Bed Mobility Overal bed mobility: (P) Needs Assistance Bed Mobility: (P) Supine to Sit     Supine to sit: (P) Mod assist;Max assist     General bed mobility comments: (P) extensive assist for L LE management, truncal elevation    Transfers Overall transfer level: (P) Needs assistance Equipment used: (P) Rolling walker (2 wheeled) Transfers: (P) Sit to/from Stand Sit to Stand: (P) Min  assist;Mod assist         General transfer comment: (P) cuing for hand placement, assist for lift off, standing balance (increased sway in A/P plane)  Ambulation/Gait Ambulation/Gait assistance: (P) Mod assist;+2 physical assistance Gait Distance (Feet): (P) 5 Feet Assistive device: (P) Rolling walker (2 wheeled)       General Gait Details: (P) short, shuffling steps; limited WBing L LE, mild/mod buckling with loading. Poor balance, limited activity tolerance  Stairs            Wheelchair Mobility    Modified Rankin (Stroke Patients Only)       Balance Overall balance assessment: Needs assistance Sitting-balance support: No upper extremity supported;Feet supported Sitting balance-Leahy Scale: Fair Sitting balance - Comments: lists posteriorally with fatigue; delayed righting reactions   Standing balance support: Bilateral upper extremity supported Standing balance-Leahy Scale: Poor Standing balance comment: requires consistent physical assist, min/mod assist +1 with RW to maintain static and dynamic balance                             Pertinent Vitals/Pain Pain Assessment: Faces Faces Pain Scale: Hurts little more Pain Location: L hip Pain Descriptors / Indicators: Aching;Tender Pain Intervention(s): Limited activity within patient's tolerance;Repositioned;Monitored during session    Home Living Family/patient expects to be discharged to:: Skilled nursing facility Living Arrangements: Alone               Additional Comments: Resident of [facility] (ALF?)    Prior Function Level of Independence: Needs assistance         Comments: Limited historian; will verify with family as available.  Reports  ambulatory with RW for household distances, to/from dining hall; denies additional fall history.     Hand Dominance        Extremity/Trunk Assessment   Upper Extremity Assessment Upper Extremity Assessment: Overall WFL for tasks assessed     Lower Extremity Assessment Lower Extremity Assessment: Generalized weakness (L LE grossly 3-/5, limited by pain; R LE grossly 4-/5) LLE: Unable to fully assess due to pain       Communication   Communication: HOH  Cognition Arousal/Alertness: Awake/alert Behavior During Therapy: WFL for tasks assessed/performed Overall Cognitive Status: History of cognitive impairments - at baseline                                 General Comments: Alert and oriented to self only; follows commands and participates well with increased time      General Comments      Exercises Other Exercises Other Exercises: Supine L LE therex, 1x10, act assist ROM for muscular strength/endurance; guarded ROM Other Exercises: Sit/stand x3 with RW, min/mod assist +2 for lift off, standing balance   Assessment/Plan    PT Assessment Patient needs continued PT services  PT Problem List Decreased strength;Decreased range of motion;Decreased activity tolerance;Decreased balance;Decreased mobility;Decreased coordination;Decreased cognition;Decreased knowledge of use of DME;Decreased safety awareness;Decreased skin integrity;Decreased knowledge of precautions       PT Treatment Interventions DME instruction;Gait training;Functional mobility training;Balance training;Therapeutic exercise;Therapeutic activities;Cognitive remediation;Patient/family education    PT Goals (Current goals can be found in the Care Plan section)  Acute Rehab PT Goals Patient Stated Goal: to get stronger PT Goal Formulation: With patient Time For Goal Achievement: 04/27/20 Potential to Achieve Goals: Fair    Frequency 7X/week   Barriers to discharge        Co-evaluation               AM-PAC PT "6 Clicks" Mobility  Outcome Measure Help needed turning from your back to your side while in a flat bed without using bedrails?: A Lot Help needed moving from lying on your back to sitting on the side of a flat bed  without using bedrails?: A Lot Help needed moving to and from a bed to a chair (including a wheelchair)?: A Lot Help needed standing up from a chair using your arms (e.g., wheelchair or bedside chair)?: A Lot Help needed to walk in hospital room?: A Lot Help needed climbing 3-5 steps with a railing? : A Lot 6 Click Score: 12    End of Session Equipment Utilized During Treatment: Gait belt Activity Tolerance: Patient tolerated treatment well Patient left: in chair;with call [place]/phone within reach;with chair alarm set Nurse Communication: Mobility status PT Visit Diagnosis: Muscle weakness (generalized) (M62.81);Difficulty in walking, not elsewhere classified (R26.2)    Time: 7035-0093 PT Time Calculation (min) (ACUTE ONLY): 32 min   Charges:   PT Evaluation $PT Eval Moderate Complexity: 1 Mod PT Treatments $Therapeutic Exercise: 8-22 mins       Levi Crass H. Owens Shark, PT, DPT, NCS 04/13/20, 5:16 PM [phone]

## 2020-04-13 NOTE — Progress Notes (Signed)
PROGRESS NOTE    Michael Kane  [id number] DOB: February 25, 1929 DOA: 04/11/2020 PCP: Venia Carbon, MD   Brief Narrative: This  86 year old male with history of hypertension, dementia, stage IIIa CKD, WPW syndrome who presents to the emergency department after a fall at nursing facility. He complained of left hip pain after the fall.  He was unable to get up after falling due to significant pain in the left hip.  There was no report of loss of consciousness or head injury.  Left hip x-ray showed intertrochanteric fracture. Orthopedics consulted.    Patient underwent ORIF, postoperative day 1.  Assessment & Plan:   Principal Problem:   Closed left hip fracture (HCC) Active Problems:   Vascular dementia ([place])   Essential hypertension, benign   Aortic stenosis   Stage 3a chronic kidney disease (HCC)   Depression   Malnutrition of moderate degree   Fall/left hip fracture: Orthopedics consulted.  X-ray showed left intertrochanteric fracture.  Orthopedics consulted, patient underwent ORIF.  Postoperative day 1.  Continue pain management, supportive care.  DVT prophylaxis with Lovenox after the surgery.  PT/OT consultation after the surgery.  TOC will be consulted.  Hypertension: Blood pressure improved. Continue to monitor blood pressure.  On amlodipine and hydrochlorothiazide.  Stage III a CKD: Currently kidney function at baseline.  Dementia/depression: Continue Zoloft and Namenda. Delirium  precautions.  Continue supportive care.  Hypokalemia: Replaced. Check BMP tomorrow   DVT prophylaxis: SCDs Code Status: Full code Family Communication: Wife is at bedside Disposition Plan:  Status is: Inpatient  Remains inpatient appropriate because:Inpatient level of care appropriate due to severity of illness   Dispo: The patient is from: Home              Anticipated d/c is to: SNF              Anticipated d/c date is: 2 days              Patient currently is not medically  stable to d/c.   Consultants:  Orthopedics  Procedures: ORIF Antimicrobials:  Anti-infectives (From admission, onward)   Start     Dose/Rate Route Frequency Ordered Stop   04/12/20 1800  ceFAZolin (ANCEF) IVPB 2g/100 mL premix        2 g 200 mL/hr over 30 Minutes Intravenous Every 6 hours 04/12/20 1526 04/13/20 0559   04/11/20 1555  ceFAZolin (ANCEF) IVPB 2g/100 mL premix        2 g 200 mL/hr over 30 Minutes Intravenous 30 min pre-op 04/11/20 1556 04/12/20 1224      Subjective: Patient was seen and examined at bedside.  Overnight events noted.  Patient is a s/p ORIF postoperative day 1.   Patient denies any pain, he wants to walk.  Objective: Vitals:   04/13/20 0342 04/13/20 0756 04/13/20 1129 04/13/20 1614  BP: (!) 161/88 (!) 153/75 130/66 124/68  Pulse: 91 91 89 77  Resp: 19 17 19 16   Temp: 98.5 F (36.9 C) 98.5 F (36.9 C) 98.9 F (37.2 C) 98.3 F (36.8 C)  TempSrc:      SpO2: 91% 94% 93% 94%  Weight:      Height:        Intake/Output Summary (Last 24 hours) at 04/13/2020 1647 Last data filed at 04/13/2020 1504 Gross per 24 hour  Intake 1145.06 ml  Output 350 ml  Net 795.06 ml   Filed Weights   04/11/20 1204 04/12/20 1416  Weight: 81.6 kg 78.9 kg  Examination:  General exam: Appears calm and comfortable , not in any distress Respiratory system: Clear to auscultation. Respiratory effort normal. Cardiovascular system: S1 & S2 heard, RRR. No JVD, murmurs, rubs, gallops or clicks. No pedal edema. Gastrointestinal system: Abdomen is nondistended, soft and nontender. No organomegaly or masses felt. Normal bowel sounds heard. Central nervous system: Alert and oriented. No focal neurological deficits. Extremities: S/p ORIF.  No edema, no cyanosis, no clubbing. Skin: No rashes, lesions or ulcers Psychiatry: Judgement and insight appear normal. Mood & affect appropriate.     Data Reviewed: I have personally reviewed following labs and imaging  studies  CBC: Recent Labs  Lab 04/11/20 1206 04/12/20 0539 04/13/20 0610  WBC 8.6 11.1* 11.1*  NEUTROABS 4.6  --  8.4*  HGB 15.3 13.7 13.1  HCT 44.0 38.6* 38.0*  MCV 91.9 90.6 91.6  PLT 228 202 [id number]   Basic Metabolic Panel: Recent Labs  Lab 04/11/20 1206 04/12/20 0539 04/13/20 0610  NA 138 135 134*  K 3.8 3.2* 3.2*  CL 100 98 97*  CO2 26 27 26   GLUCOSE 111* 132* 128*  BUN 24* 22 16  CREATININE 1.13 0.92 0.83  CALCIUM 9.5 8.7* 8.6*   GFR: Estimated Creatinine Clearance: 62.3 mL/min (by C-G formula based on SCr of 0.83 mg/dL). Liver Function Tests: No results for input(s): AST, ALT, ALKPHOS, BILITOT, PROT, ALBUMIN in the last 168 hours. No results for input(s): LIPASE, AMYLASE in the last 168 hours. No results for input(s): AMMONIA in the last 168 hours. Coagulation Profile: No results for input(s): INR, PROTIME in the last 168 hours. Cardiac Enzymes: No results for input(s): CKTOTAL, CKMB, CKMBINDEX, TROPONINI in the last 168 hours. BNP (last 3 results) No results for input(s): PROBNP in the last 8760 hours. HbA1C: No results for input(s): HGBA1C in the last 72 hours. CBG: No results for input(s): GLUCAP in the last 168 hours. Lipid Profile: No results for input(s): CHOL, HDL, LDLCALC, TRIG, CHOLHDL, LDLDIRECT in the last 72 hours. Thyroid Function Tests: No results for input(s): TSH, T4TOTAL, FREET4, T3FREE, THYROIDAB in the last 72 hours. Anemia Panel: No results for input(s): VITAMINB12, FOLATE, FERRITIN, TIBC, IRON, RETICCTPCT in the last 72 hours. Sepsis Labs: No results for input(s): PROCALCITON, LATICACIDVEN in the last 168 hours.  Recent Results (from the past 240 hour(s))  Resp Panel by RT-PCR (Flu A&B, Covid) Nasopharyngeal Swab     Status: None   Collection Time: 04/11/20 12:06 PM   Specimen: Nasopharyngeal Swab; Nasopharyngeal(NP) swabs in vial transport medium  Result Value Ref Range Status   SARS Coronavirus 2 by RT PCR NEGATIVE NEGATIVE Final     Comment: (NOTE) SARS-CoV-2 target nucleic acids are NOT DETECTED.  The SARS-CoV-2 RNA is generally detectable in upper respiratory specimens during the acute phase of infection. The lowest concentration of SARS-CoV-2 viral copies this assay can detect is 138 copies/mL. A negative result does not preclude SARS-Cov-2 infection and should not be used as the sole basis for treatment or other patient management decisions. A negative result may occur with  improper specimen collection/handling, submission of specimen other than nasopharyngeal swab, presence of viral mutation(s) within the areas targeted by this assay, and inadequate number of viral copies(<138 copies/mL). A negative result must be combined with clinical observations, patient history, and epidemiological information. The expected result is Negative.  Fact Sheet for Patients:  EntrepreneurPulse.com.au  Fact Sheet for Healthcare Providers:  IncredibleEmployment.be  This test is no t yet approved or cleared by the [place]  FDA and  has been authorized for detection and/or diagnosis of SARS-CoV-2 by FDA under an Emergency Use Authorization (EUA). This EUA will remain  in effect (meaning this test can be used) for the duration of the COVID-19 declaration under Section 564(b)(1) of the Act, 21 U.S.C.section 360bbb-3(b)(1), unless the authorization is terminated  or revoked sooner.       Influenza A by PCR NEGATIVE NEGATIVE Final   Influenza B by PCR NEGATIVE NEGATIVE Final    Comment: (NOTE) The Xpert Xpress SARS-CoV-2/FLU/RSV plus assay is intended as an aid in the diagnosis of influenza from Nasopharyngeal swab specimens and should not be used as a sole basis for treatment. Nasal washings and aspirates are unacceptable for Xpert Xpress SARS-CoV-2/FLU/RSV testing.  Fact Sheet for Patients: EntrepreneurPulse.com.au  Fact Sheet for Healthcare  Providers: IncredibleEmployment.be  This test is not yet approved or cleared by the [place] FDA and has been authorized for detection and/or diagnosis of SARS-CoV-2 by FDA under an Emergency Use Authorization (EUA). This EUA will remain in effect (meaning this test can be used) for the duration of the COVID-19 declaration under Section 564(b)(1) of the Act, 21 U.S.C. section 360bbb-3(b)(1), unless the authorization is terminated or revoked.  Performed at [facility], [address] [place], [place] [address]   MRSA PCR Screening     Status: None   Collection Time: 04/11/20  4:49 PM   Specimen: Nasopharyngeal  Result Value Ref Range Status   MRSA by PCR NEGATIVE NEGATIVE Final    Comment:        The GeneXpert MRSA Assay (FDA approved for NASAL specimens only), is one component of a comprehensive MRSA colonization surveillance program. It is not intended to diagnose MRSA infection nor to guide or monitor treatment for MRSA infections. Performed at [facility], [address], [place], [place] [address]       Radiology Studies: DG HIP OPERATIVE UNILAT W OR W/O PELVIS LEFT  Result Date: 04/12/2020 CLINICAL DATA:  Status post left IM nail. EXAM: OPERATIVE left HIP (WITH PELVIS IF PERFORMED) 2 VIEWS FLUOROSCOPY TIME:  24 seconds TECHNIQUE: Fluoroscopic spot image(s) were submitted for interpretation post-operatively. COMPARISON:  04/11/2020 FINDINGS: Five sequential images obtained via portable C-arm radiography in the operating room show open reduction and internal fixation of the intertrochanteric fracture involving the proximal left femur. Placement of IM nail and hip screw noted. Fracture fragments are in anatomic alignment. IMPRESSION: Status post ORIF of intertrochanteric fracture involving the proximal left femur. Electronically Signed   By: Kerby Moors M.D.   On: 04/12/2020 12:36    Scheduled Meds: . amLODipine  10 mg Oral  Daily  . Chlorhexidine Gluconate Cloth  6 each Topical Daily  . cholestyramine  4 g Oral QHS  . docusate sodium  100 mg Oral BID  . enoxaparin (LOVENOX) injection  40 mg Subcutaneous Q24H  . feeding supplement  237 mL Oral BID BM  . hydrochlorothiazide  25 mg Oral Daily  . memantine  5 mg Oral BID  . multivitamin with minerals  1 tablet Oral Daily  . senna  1 tablet Oral BID  . sertraline  25 mg Oral Daily   Continuous Infusions: . methocarbamol (ROBAXIN) IV       LOS: 2 days    Time spent: 25 mins    Shawna Clamp, MD [facility]   If 7PM-7AM, please contact night-coverage

## 2020-04-13 NOTE — Progress Notes (Signed)
  Subjective:  Patient reports pain as mild to moderate.    Objective:   VITALS:   Vitals:   04/12/20 1659 04/12/20 1937 04/12/20 2332 04/13/20 0342  BP: (!) 170/87 (!) 158/78 (!) 166/68 (!) 161/88  Pulse: 68 76 70 91  Resp: 16 19 17 19   Temp: 99.6 F (37.6 C) 98.9 F (37.2 C) 98.7 F (37.1 C) 98.5 F (36.9 C)  TempSrc: Oral     SpO2: 95% 94% 93% 91%  Weight:      Height:        PHYSICAL EXAM:  Neurologically intact ABD soft Neurovascular intact Sensation intact distally Intact pulses distally Dorsiflexion/Plantar flexion intact Incision: dressing C/D/I No cellulitis present Compartment soft  LABS  No results found for this or any previous visit (from the past 24 hour(s)).  DG Chest 1 View  Result Date: 04/11/2020 CLINICAL DATA:  Recent fall with known left hip fracture EXAM: CHEST  1 VIEW COMPARISON:  None. FINDINGS: Cardiac shadow is within normal limits. Aortic calcifications are noted. Mild scarring is noted in the bases bilaterally. No acute bony abnormality is seen. IMPRESSION: Mild bibasilar scarring.  No acute abnormality noted. Electronically Signed   By: Inez Catalina M.D.   On: 04/11/2020 13:34   DG HIP OPERATIVE UNILAT W OR W/O PELVIS LEFT  Result Date: 04/12/2020 CLINICAL DATA:  Status post left IM nail. EXAM: OPERATIVE left HIP (WITH PELVIS IF PERFORMED) 2 VIEWS FLUOROSCOPY TIME:  24 seconds TECHNIQUE: Fluoroscopic spot image(s) were submitted for interpretation post-operatively. COMPARISON:  04/11/2020 FINDINGS: Five sequential images obtained via portable C-arm radiography in the operating room show open reduction and internal fixation of the intertrochanteric fracture involving the proximal left femur. Placement of IM nail and hip screw noted. Fracture fragments are in anatomic alignment. IMPRESSION: Status post ORIF of intertrochanteric fracture involving the proximal left femur. Electronically Signed   By: Kerby Moors M.D.   On: 04/12/2020 12:36    DG Hip Unilat W or Wo Pelvis 2-3 Views Left  Result Date: 04/11/2020 CLINICAL DATA:  Recent fall with leg pain, initial encounter EXAM: DG HIP (WITH OR WITHOUT PELVIS) 2-3V LEFT COMPARISON:  None. FINDINGS: Pelvic ring is intact. Left intratrochanteric fracture is noted with impaction and angulation at the fracture site. No other focal abnormality is noted. IMPRESSION: Left intratrochanteric fracture as described. Electronically Signed   By: Inez Catalina M.D.   On: 04/11/2020 13:30    Assessment/Plan: 1 Day Post-Op   Principal Problem:   Closed left hip fracture (HCC) Active Problems:   Vascular dementia ([place])   Essential hypertension, benign   Aortic stenosis   Stage 3a chronic kidney disease (HCC)   Depression   Advance diet Up with therapy  WBAT LLE Lovenox 40 1 SQ daily X 2 weeks starting today   Michael Kane , PA-C 04/13/2020, 6:41 AM

## 2020-04-14 LAB — PHOSPHORUS: Phosphorus: 2.4 mg/dL — ABNORMAL LOW (ref 2.5–4.6)

## 2020-04-14 LAB — CBC
HCT: 36.2 % — ABNORMAL LOW (ref 39.0–52.0)
Hemoglobin: 12.8 g/dL — ABNORMAL LOW (ref 13.0–17.0)
MCH: 32.4 pg (ref 26.0–34.0)
MCHC: 35.4 g/dL (ref 30.0–36.0)
MCV: 91.6 fL (ref 80.0–100.0)
Platelets: 182 10*3/uL (ref 150–400)
RBC: 3.95 MIL/uL — ABNORMAL LOW (ref 4.22–5.81)
RDW: 13.8 % (ref 11.5–15.5)
WBC: 12.4 10*3/uL — ABNORMAL HIGH (ref 4.0–10.5)
nRBC: 0 % (ref 0.0–0.2)

## 2020-04-14 LAB — BASIC METABOLIC PANEL
Anion gap: 9 (ref 5–15)
BUN: 18 mg/dL (ref 8–23)
CO2: 26 mmol/L (ref 22–32)
Calcium: 8.3 mg/dL — ABNORMAL LOW (ref 8.9–10.3)
Chloride: 97 mmol/L — ABNORMAL LOW (ref 98–111)
Creatinine, Ser: 0.79 mg/dL (ref 0.61–1.24)
GFR, Estimated: >60 mL/min (ref >60)
Glucose, Bld: 111 mg/dL — ABNORMAL HIGH (ref 70–99)
Potassium: 3.5 mmol/L (ref 3.5–5.1)
Sodium: 132 mmol/L — ABNORMAL LOW (ref 135–145)

## 2020-04-14 LAB — RESP PANEL BY RT-PCR (FLU A&B, COVID) ARPGX2
Influenza A by PCR: NEGATIVE
Influenza B by PCR: NEGATIVE
SARS Coronavirus 2 by RT PCR: NEGATIVE

## 2020-04-14 LAB — MAGNESIUM: Magnesium: 2 mg/dL (ref 1.7–2.4)

## 2020-04-14 MED ORDER — ENOXAPARIN SODIUM 40 MG/0.4ML ~~LOC~~ SOLN: 40.0000 mg | SUBCUTANEOUS | 14 refills | Status: DC

## 2020-04-14 NOTE — Progress Notes (Signed)
PT Cancellation Note  Patient Details Name: Michael Kane MRN: [id number] DOB: [birth year]   Cancelled Treatment:    Reason Eval/Treat Not Completed:  (Treatment session attempted.  Patient currently eating breakfast; family visiting at bedside. Will re-attempt at later time this AM as appropriate and available.)  [name] H. Owens Shark, PT, DPT, NCS 04/14/20, 9:35 AM [phone]

## 2020-04-14 NOTE — Discharge Summary (Addendum)
Physician Discharge Summary  Michael Kane [id number] DOB: Feb 04, 1929 DOA: 04/11/2020  PCP: Venia Carbon, MD  Admit date: 04/11/2020   Discharge date: 04/15/2020  Admitted From: Home.  Disposition: [place].  Recommendations for Outpatient Follow-up:  1. Follow up with PCP in 1-2 weeks 2. Please obtain BMP/CBC in one week 3. Advised to take Lovenox 40 mg subcu daily for  2 weeks for postoperative DVT prophylaxis. 4. Advised to follow-up with orthopedics in 2 weeks.  5. Patient has developed Mild urinary retention, advised in and out cath. 6. Advised follow-up with urology if urinary retention continue to persist.  Home Health: None Equipment/Devices: None  Discharge Condition: Stable CODE STATUS:DNR Diet recommendation: Heart Healthy  Brief [facility] course: This 86 year old male with history of hypertension, dementia, stage IIIa CKD, WPW syndrome who presents to the emergency department after a fall at nursing facility.He complained of left hip pain after the fall. He was unable to get up after falling due to significant pain in the left hip. There was no report of loss of consciousness or head injury. Patient was brought in the ED.  Left hip x-ray showed intertrochanteric fracture. Orthopedics was consulted. Patient underwent ORIF, Postoperative day 3.  Diet was advanced,  Patient has participated in physical therapy,  recommended skilled nursing facility for rehab.  Advised weightbearing as tolerated.  Patient is cleared from Ortho to be discharged.  Advised Lovenox 40 mg subcu daily for 2 weeks for postoperative DVT prophylaxis.  Patient appears better and Patient is being discharged to skilled nursing facility for rehab.  Patient will follow up with Dr. Harlow Mares in 2 weeks for staple removal and imaging.  Patient has developed intermittent urinary retention postoperatively,  requiring in and out cath.  I hope with ambulation,  this might resolve.   Advised in and out cath at nursing home if needed,  if it continues to persist , advised urology consult.   He was managed for below problems.  Discharge Diagnoses:  Principal Problem:   Closed left hip fracture ([place]) Active Problems:   Vascular dementia ([place])   Essential hypertension, benign   Aortic stenosis   Stage 3a chronic kidney disease (HCC)   Depression   Malnutrition of moderate degree  Fall/left hip fracture: Orthopedics consulted. X-ray showed left intertrochanteric fracture. Orthopedics consulted, patient underwent ORIF.  Postoperative day 3. Continue pain management, supportive care. DVT prophylaxis with Lovenox after the surgery. PT/OT consultation , recommended SNF. TOC will be consulted.  Intermittent urinary retention: Patient has developed intermittent urinary retention postoperatively requiring in and out cath.  We hope this will improve with ambulation, consider urology follow-up if it continues to persist.  Hypertension: Blood pressure improved.Continue to monitor blood pressure. On amlodipine and hydrochlorothiazide.  Stage IIIaCKD:Currently kidney functions at baseline.  Dementia/depression: Continue Zoloft and Namenda.Deliriumprecautions. Continue supportive care.  Hypokalemia: Improved.   Discharge Instructions  Discharge Instructions    Call MD for:  difficulty breathing, headache or visual disturbances   Complete by: As directed    Call MD for:  persistant nausea and vomiting   Complete by: As directed    Call MD for:  severe uncontrolled pain   Complete by: As directed    Call MD for:  temperature >100.4   Complete by: As directed    Diet - low sodium heart healthy   Complete by: As directed    Discharge instructions   Complete by: As directed    Advised to follow-up with primary care  physician in 1 week.   Advised to take Lovenox 40 mg subcu daily for postoperative prophylaxis. Advised to follow-u with orthopedics in 2  weeks.   Discharge wound care:   Complete by: As directed    Follow-up in 2 weeks with orthopedics.   Increase activity slowly   Complete by: As directed      Allergies as of 04/15/2020      Reactions   Lisinopril    REACTION: cough   Lactose Intolerance (gi) Diarrhea      Medication List    TAKE these medications   amLODipine 10 MG tablet Commonly known as: NORVASC TAKE 1 TABLET BY MOUTH EVERY DAY   cholestyramine 4 g packet Commonly known as: QUESTRAN Take 4 g by mouth at bedtime.   enoxaparin 40 MG/0.4ML injection Commonly known as: LOVENOX Inject 0.4 mLs (40 mg total) into the skin daily.   hydrochlorothiazide 25 MG tablet Commonly known as: HYDRODIURIL Take 1 tablet (25 mg total) by mouth daily.   memantine 5 MG tablet Commonly known as: NAMENDA Take 5 mg by mouth 2 (two) times daily.   MENS MULTI VITAMIN & MINERAL PO   sertraline 25 MG tablet Commonly known as: ZOLOFT Take 25 mg by mouth daily.            Discharge Care Instructions  (From admission, onward)         Start     Ordered   04/14/20 0000  Discharge wound care:       Comments: Follow-up in 2 weeks with orthopedics.   04/14/20 1039          Contact information for follow-up providers    Viviana Simpler I, MD Follow up in 1 week(s).   Specialties: Internal Medicine, Pediatrics Contact information: [place] [address] [phone]        Lovell Sheehan, MD Follow up in 2 week(s).   Specialty: Orthopedic Surgery Contact information: [place] [address] [phone]            Contact information for after-discharge care    Destination    [facility] .   Service: Skilled Nursing Contact information: [place] [address] [phone]                 Allergies  Allergen Reactions  . Lisinopril     REACTION: cough  . Lactose Intolerance (Gi) Diarrhea     Consultations: Orthopedics  Procedures/Studies: DG Chest 1 View  Result Date: 04/11/2020 CLINICAL DATA:  Recent fall with known left hip fracture EXAM: CHEST  1 VIEW COMPARISON:  None. FINDINGS: Cardiac shadow is within normal limits. Aortic calcifications are noted. Mild scarring is noted in the bases bilaterally. No acute bony abnormality is seen. IMPRESSION: Mild bibasilar scarring.  No acute abnormality noted. Electronically Signed   By: Inez Catalina M.D.   On: 04/11/2020 13:34   DG HIP OPERATIVE UNILAT W OR W/O PELVIS LEFT  Result Date: 04/12/2020 CLINICAL DATA:  Status post left IM nail. EXAM: OPERATIVE left HIP (WITH PELVIS IF PERFORMED) 2 VIEWS FLUOROSCOPY TIME:  24 seconds TECHNIQUE: Fluoroscopic spot image(s) were submitted for interpretation post-operatively. COMPARISON:  04/11/2020 FINDINGS: Five sequential images obtained via portable C-arm radiography in the operating room show open reduction and internal fixation of the intertrochanteric fracture involving the proximal left femur. Placement of IM nail and hip screw noted. Fracture fragments are in anatomic alignment. IMPRESSION:  Status post ORIF of intertrochanteric fracture involving the proximal left femur. Electronically Signed   By: Kerby Moors M.D.   On: 04/12/2020 12:36   DG Hip Unilat W or Wo Pelvis 2-3 Views Left  Result Date: 04/11/2020 CLINICAL DATA:  Recent fall with leg pain, initial encounter EXAM: DG HIP (WITH OR WITHOUT PELVIS) 2-3V LEFT COMPARISON:  None. FINDINGS: Pelvic ring is intact. Left intratrochanteric fracture is noted with impaction and angulation at the fracture site. No other focal abnormality is noted. IMPRESSION: Left intratrochanteric fracture as described. Electronically Signed   By: Inez Catalina M.D.   On: 04/11/2020 13:30   Open reduction internal fixation For left hip fracture.   Subjective: Patient was seen and examined at bedside.  Overnight events noted.  Patient reports feeling  better,  Pain is improved.  Patient has participated in physical therapy yesterday,  tolerated well.  Discharge Exam: Vitals:   04/14/20 2322 04/15/20 0400  BP: (!) 155/84 (!) 162/76  Pulse: 88 72  Resp: 16 15  Temp: 98.5 F (36.9 C) 97.9 F (36.6 C)  SpO2: 92% 94%   Vitals:   04/14/20 1623 04/14/20 1943 04/14/20 2322 04/15/20 0400  BP: (!) 156/74 (!) 160/69 (!) 155/84 (!) 162/76  Pulse: 90 77 88 72  Resp: 16 16 16 15   Temp: 98.2 F (36.8 C) 98.3 F (36.8 C) 98.5 F (36.9 C) 97.9 F (36.6 C)  TempSrc:    Oral  SpO2: 94% 93% 92% 94%  Weight:      Height:        General: Pt is alert, awake, not in acute distress Cardiovascular: RRR, S1/S2 +, no rubs, no gallops Respiratory: CTA bilaterally, no wheezing, no rhonchi Abdominal: Soft, NT, ND, bowel sounds + Extremities: no edema, no cyanosis.     The results of significant diagnostics from this hospitalization (including imaging, microbiology, ancillary and laboratory) are listed below for reference.     Microbiology: Recent Results (from the past 240 hour(s))  Resp Panel by RT-PCR (Flu A&B, Covid) Nasopharyngeal Swab     Status: None   Collection Time: 04/11/20 12:06 PM   Specimen: Nasopharyngeal Swab; Nasopharyngeal(NP) swabs in vial transport medium  Result Value Ref Range Status   SARS Coronavirus 2 by RT PCR NEGATIVE NEGATIVE Final    Comment: (NOTE) SARS-CoV-2 target nucleic acids are NOT DETECTED.  The SARS-CoV-2 RNA is generally detectable in upper respiratory specimens during the acute phase of infection. The lowest concentration of SARS-CoV-2 viral copies this assay can detect is 138 copies/mL. A negative result does not preclude SARS-Cov-2 infection and should not be used as the sole basis for treatment or other patient management decisions. A negative result may occur with  improper specimen collection/handling, submission of specimen other than nasopharyngeal swab, presence of viral mutation(s) within  the areas targeted by this assay, and inadequate number of viral copies(<138 copies/mL). A negative result must be combined with clinical observations, patient history, and epidemiological information. The expected result is Negative.  Fact Sheet for Patients:  EntrepreneurPulse.com.au  Fact Sheet for Healthcare Providers:  IncredibleEmployment.be  This test is no t yet approved or cleared by the [place] FDA and  has been authorized for detection and/or diagnosis of SARS-CoV-2 by FDA under an Emergency Use Authorization (EUA). This EUA will remain  in effect (meaning this test can be used) for the duration of the COVID-19 declaration under Section 564(b)(1) of the Act, 21 U.S.C.section 360bbb-3(b)(1), unless the authorization is terminated  or revoked  sooner.       Influenza A by PCR NEGATIVE NEGATIVE Final   Influenza B by PCR NEGATIVE NEGATIVE Final    Comment: (NOTE) The Xpert Xpress SARS-CoV-2/FLU/RSV plus assay is intended as an aid in the diagnosis of influenza from Nasopharyngeal swab specimens and should not be used as a sole basis for treatment. Nasal washings and aspirates are unacceptable for Xpert Xpress SARS-CoV-2/FLU/RSV testing.  Fact Sheet for Patients: EntrepreneurPulse.com.au  Fact Sheet for Healthcare Providers: IncredibleEmployment.be  This test is not yet approved or cleared by the [place] FDA and has been authorized for detection and/or diagnosis of SARS-CoV-2 by FDA under an Emergency Use Authorization (EUA). This EUA will remain in effect (meaning this test can be used) for the duration of the COVID-19 declaration under Section 564(b)(1) of the Act, 21 U.S.C. section 360bbb-3(b)(1), unless the authorization is terminated or revoked.  Performed at [facility], [address] [place], [place] [address]   MRSA PCR Screening     Status: None   Collection  Time: 04/11/20  4:49 PM   Specimen: Nasopharyngeal  Result Value Ref Range Status   MRSA by PCR NEGATIVE NEGATIVE Final    Comment:        The GeneXpert MRSA Assay (FDA approved for NASAL specimens only), is one component of a comprehensive MRSA colonization surveillance program. It is not intended to diagnose MRSA infection nor to guide or monitor treatment for MRSA infections. Performed at [facility], [address] [place], [place] [address]   Resp Panel by RT-PCR (Flu A&B, Covid) Nasopharyngeal Swab     Status: None   Collection Time: 04/14/20 12:33 PM   Specimen: Nasopharyngeal Swab; Nasopharyngeal(NP) swabs in vial transport medium  Result Value Ref Range Status   SARS Coronavirus 2 by RT PCR NEGATIVE NEGATIVE Final    Comment: (NOTE) SARS-CoV-2 target nucleic acids are NOT DETECTED.  The SARS-CoV-2 RNA is generally detectable in upper respiratory specimens during the acute phase of infection. The lowest concentration of SARS-CoV-2 viral copies this assay can detect is 138 copies/mL. A negative result does not preclude SARS-Cov-2 infection and should not be used as the sole basis for treatment or other patient management decisions. A negative result may occur with  improper specimen collection/handling, submission of specimen other than nasopharyngeal swab, presence of viral mutation(s) within the areas targeted by this assay, and inadequate number of viral copies(<138 copies/mL). A negative result must be combined with clinical observations, patient history, and epidemiological information. The expected result is Negative.  Fact Sheet for Patients:  EntrepreneurPulse.com.au  Fact Sheet for Healthcare Providers:  IncredibleEmployment.be  This test is no t yet approved or cleared by the [place] FDA and  has been authorized for detection and/or diagnosis of SARS-CoV-2 by FDA under an Emergency Use Authorization  (EUA). This EUA will remain  in effect (meaning this test can be used) for the duration of the COVID-19 declaration under Section 564(b)(1) of the Act, 21 U.S.C.section 360bbb-3(b)(1), unless the authorization is terminated  or revoked sooner.       Influenza A by PCR NEGATIVE NEGATIVE Final   Influenza B by PCR NEGATIVE NEGATIVE Final    Comment: (NOTE) The Xpert Xpress SARS-CoV-2/FLU/RSV plus assay is intended as an aid in the diagnosis of influenza from Nasopharyngeal swab specimens and should not be used as a sole basis for treatment. Nasal washings and aspirates are unacceptable for Xpert Xpress SARS-CoV-2/FLU/RSV testing.  Fact Sheet for Patients: EntrepreneurPulse.com.au  Fact  Sheet for Healthcare Providers: IncredibleEmployment.be  This test is not yet approved or cleared by the [place] and has been authorized for detection and/or diagnosis of SARS-CoV-2 by FDA under an Emergency Use Authorization (EUA). This EUA will remain in effect (meaning this test can be used) for the duration of the COVID-19 declaration under Section 564(b)(1) of the Act, 21 U.S.C. section 360bbb-3(b)(1), unless the authorization is terminated or revoked.  Performed at [facility] [address] [place], [place] [address]      Labs: BNP (last 3 results) No results for input(s): BNP in the last 8760 hours. Basic Metabolic Panel: Recent Labs  Lab 04/11/20 1206 04/12/20 0539 04/13/20 0610 04/14/20 0838  NA 138 135 134* 132*  K 3.8 3.2* 3.2* 3.5  CL 100 98 97* 97*  CO2 26 27 26 26   GLUCOSE 111* 132* 128* 111*  BUN 24* 22 16 18   CREATININE 1.13 0.92 0.83 0.79  CALCIUM 9.5 8.7* 8.6* 8.3*  MG  --   --   --  2.0  PHOS  --   --   --  2.4*   Liver Function Tests: No results for input(s): AST, ALT, ALKPHOS, BILITOT, PROT, ALBUMIN in the last 168 hours. No results for input(s): LIPASE, AMYLASE in the last 168 hours. No results for  input(s): AMMONIA in the last 168 hours. CBC: Recent Labs  Lab 04/11/20 1206 04/12/20 0539 04/13/20 0610 04/14/20 0838  WBC 8.6 11.1* 11.1* 12.4*  NEUTROABS 4.6  --  8.4*  --   HGB 15.3 13.7 13.1 12.8*  HCT 44.0 38.6* 38.0* 36.2*  MCV 91.9 90.6 91.6 91.6  PLT 228 202 183 182   Cardiac Enzymes: No results for input(s): CKTOTAL, CKMB, CKMBINDEX, TROPONINI in the last 168 hours. BNP: Invalid input(s): POCBNP CBG: No results for input(s): GLUCAP in the last 168 hours. D-Dimer No results for input(s): DDIMER in the last 72 hours. Hgb A1c No results for input(s): HGBA1C in the last 72 hours. Lipid Profile No results for input(s): CHOL, HDL, LDLCALC, TRIG, CHOLHDL, LDLDIRECT in the last 72 hours. Thyroid function studies No results for input(s): TSH, T4TOTAL, T3FREE, THYROIDAB in the last 72 hours.  Invalid input(s): FREET3 Anemia work up No results for input(s): VITAMINB12, FOLATE, FERRITIN, TIBC, IRON, RETICCTPCT in the last 72 hours. Urinalysis    Component Value Date/Time   COLORURINE Yellow 06/16/2012 0910   APPEARANCEUR Clear 06/16/2012 0910   LABSPEC 1.016 06/16/2012 0910   PHURINE 7.0 06/16/2012 0910   GLUCOSEU Negative 06/16/2012 0910   HGBUR 1+ 06/16/2012 0910   BILIRUBINUR neg 02/23/2015 0919   BILIRUBINUR Negative 06/16/2012 0910   KETONESUR Negative 06/16/2012 0910   PROTEINUR 1+ 02/23/2015 0919   PROTEINUR Negative 06/16/2012 0910   UROBILINOGEN negative 02/23/2015 0919   NITRITE neg 02/23/2015 0919   NITRITE Negative 06/16/2012 0910   LEUKOCYTESUR Negative 02/23/2015 0919   LEUKOCYTESUR Negative 06/16/2012 0910   Sepsis Labs Invalid input(s): PROCALCITONIN,  WBC,  LACTICIDVEN Microbiology Recent Results (from the past 240 hour(s))  Resp Panel by RT-PCR (Flu A&B, Covid) Nasopharyngeal Swab     Status: None   Collection Time: 04/11/20 12:06 PM   Specimen: Nasopharyngeal Swab; Nasopharyngeal(NP) swabs in vial transport medium  Result Value Ref Range  Status   SARS Coronavirus 2 by RT PCR NEGATIVE NEGATIVE Final    Comment: (NOTE) SARS-CoV-2 target nucleic acids are NOT DETECTED.  The SARS-CoV-2 RNA is generally detectable in upper respiratory specimens during the acute phase of  infection. The lowest concentration of SARS-CoV-2 viral copies this assay can detect is 138 copies/mL. A negative result does not preclude SARS-Cov-2 infection and should not be used as the sole basis for treatment or other patient management decisions. A negative result may occur with  improper specimen collection/handling, submission of specimen other than nasopharyngeal swab, presence of viral mutation(s) within the areas targeted by this assay, and inadequate number of viral copies(<138 copies/mL). A negative result must be combined with clinical observations, patient history, and epidemiological information. The expected result is Negative.  Fact Sheet for Patients:  EntrepreneurPulse.com.au  Fact Sheet for Healthcare Providers:  IncredibleEmployment.be  This test is no t yet approved or cleared by the [place] FDA and  has been authorized for detection and/or diagnosis of SARS-CoV-2 by FDA under an Emergency Use Authorization (EUA). This EUA will remain  in effect (meaning this test can be used) for the duration of the COVID-19 declaration under Section 564(b)(1) of the Act, 21 U.S.C.section 360bbb-3(b)(1), unless the authorization is terminated  or revoked sooner.       Influenza A by PCR NEGATIVE NEGATIVE Final   Influenza B by PCR NEGATIVE NEGATIVE Final    Comment: (NOTE) The Xpert Xpress SARS-CoV-2/FLU/RSV plus assay is intended as an aid in the diagnosis of influenza from Nasopharyngeal swab specimens and should not be used as a sole basis for treatment. Nasal washings and aspirates are unacceptable for Xpert Xpress SARS-CoV-2/FLU/RSV testing.  Fact Sheet for  Patients: EntrepreneurPulse.com.au  Fact Sheet for Healthcare Providers: IncredibleEmployment.be  This test is not yet approved or cleared by the [place] FDA and has been authorized for detection and/or diagnosis of SARS-CoV-2 by FDA under an Emergency Use Authorization (EUA). This EUA will remain in effect (meaning this test can be used) for the duration of the COVID-19 declaration under Section 564(b)(1) of the Act, 21 U.S.C. section 360bbb-3(b)(1), unless the authorization is terminated or revoked.  Performed at [facility], [address] [place], [place] [address]   MRSA PCR Screening     Status: None   Collection Time: 04/11/20  4:49 PM   Specimen: Nasopharyngeal  Result Value Ref Range Status   MRSA by PCR NEGATIVE NEGATIVE Final    Comment:        The GeneXpert MRSA Assay (FDA approved for NASAL specimens only), is one component of a comprehensive MRSA colonization surveillance program. It is not intended to diagnose MRSA infection nor to guide or monitor treatment for MRSA infections. Performed at [facility], [address] [place], [place] [address]   Resp Panel by RT-PCR (Flu A&B, Covid) Nasopharyngeal Swab     Status: None   Collection Time: 04/14/20 12:33 PM   Specimen: Nasopharyngeal Swab; Nasopharyngeal(NP) swabs in vial transport medium  Result Value Ref Range Status   SARS Coronavirus 2 by RT PCR NEGATIVE NEGATIVE Final    Comment: (NOTE) SARS-CoV-2 target nucleic acids are NOT DETECTED.  The SARS-CoV-2 RNA is generally detectable in upper respiratory specimens during the acute phase of infection. The lowest concentration of SARS-CoV-2 viral copies this assay can detect is 138 copies/mL. A negative result does not preclude SARS-Cov-2 infection and should not be used as the sole basis for treatment or other patient management decisions. A negative result may occur with  improper specimen  collection/handling, submission of specimen other than nasopharyngeal swab, presence of viral mutation(s) within the areas targeted by this assay, and inadequate number of viral copies(<138 copies/mL). A negative result must  be combined with clinical observations, patient history, and epidemiological information. The expected result is Negative.  Fact Sheet for Patients:  EntrepreneurPulse.com.au  Fact Sheet for Healthcare Providers:  IncredibleEmployment.be  This test is no t yet approved or cleared by the [place] FDA and  has been authorized for detection and/or diagnosis of SARS-CoV-2 by FDA under an Emergency Use Authorization (EUA). This EUA will remain  in effect (meaning this test can be used) for the duration of the COVID-19 declaration under Section 564(b)(1) of the Act, 21 U.S.C.section 360bbb-3(b)(1), unless the authorization is terminated  or revoked sooner.       Influenza A by PCR NEGATIVE NEGATIVE Final   Influenza B by PCR NEGATIVE NEGATIVE Final    Comment: (NOTE) The Xpert Xpress SARS-CoV-2/FLU/RSV plus assay is intended as an aid in the diagnosis of influenza from Nasopharyngeal swab specimens and should not be used as a sole basis for treatment. Nasal washings and aspirates are unacceptable for Xpert Xpress SARS-CoV-2/FLU/RSV testing.  Fact Sheet for Patients: EntrepreneurPulse.com.au  Fact Sheet for Healthcare Providers: IncredibleEmployment.be  This test is not yet approved or cleared by the [place] FDA and has been authorized for detection and/or diagnosis of SARS-CoV-2 by FDA under an Emergency Use Authorization (EUA). This EUA will remain in effect (meaning this test can be used) for the duration of the COVID-19 declaration under Section 564(b)(1) of the Act, 21 U.S.C. section 360bbb-3(b)(1), unless the authorization is terminated or revoked.  Performed at [facility], [address], [place], [place] [address]      Time coordinating discharge: Over 30 minutes  SIGNED:   Shawna Clamp, MD  [facility] 04/15/2020, 9:57 AM Pager   If 7PM-7AM, please contact night-coverage [email or web address]

## 2020-04-14 NOTE — Discharge Instructions (Signed)
Advised to follow-up with primary care physician in 1 week.   Advised to take Lovenox 40 mg subcu daily for postoperative prophylaxis. Advised to follow-u with orthopedics in 2 weeks.

## 2020-04-14 NOTE — Progress Notes (Signed)
  Subjective:  Patient reports pain as mild to moderate.    Objective:   VITALS:   Vitals:   04/13/20 1614 04/13/20 1942 04/13/20 2336 04/14/20 0434  BP: 124/68 (!) 156/69 (!) 152/80 (!) 163/85  Pulse: 77 73 85 84  Resp: 16 16 15 17   Temp: 98.3 F (36.8 C) 99.2 F (37.3 C) 98.8 F (37.1 C) 98 F (36.7 C)  TempSrc:      SpO2: 94% 94% 93% 93%  Weight:      Height:        PHYSICAL EXAM:  Neurologically intact ABD soft Neurovascular intact Sensation intact distally Intact pulses distally Dorsiflexion/Plantar flexion intact Incision: dressing C/D/I No cellulitis present Compartment soft  LABS  No results found for this or any previous visit (from the past 24 hour(s)).  DG HIP OPERATIVE UNILAT W OR W/O PELVIS LEFT  Result Date: 04/12/2020 CLINICAL DATA:  Status post left IM nail. EXAM: OPERATIVE left HIP (WITH PELVIS IF PERFORMED) 2 VIEWS FLUOROSCOPY TIME:  24 seconds TECHNIQUE: Fluoroscopic spot image(s) were submitted for interpretation post-operatively. COMPARISON:  04/11/2020 FINDINGS: Five sequential images obtained via portable C-arm radiography in the operating room show open reduction and internal fixation of the intertrochanteric fracture involving the proximal left femur. Placement of IM nail and hip screw noted. Fracture fragments are in anatomic alignment. IMPRESSION: Status post ORIF of intertrochanteric fracture involving the proximal left femur. Electronically Signed   By: Kerby Moors M.D.   On: 04/12/2020 12:36    Assessment/Plan: 2 Days Post-Op   Principal Problem:   Closed left hip fracture (HCC) Active Problems:   Vascular dementia ([place])   Essential hypertension, benign   Aortic stenosis   Stage 3a chronic kidney disease (HCC)   Depression   Malnutrition of moderate degree   Advance diet Up with therapy WBAT LLE Lovenox 40 1 SQ daily X 2 weeks starting 04/13/20 Discharge per medicine Follow up in 2 weeks (04/26/20) for imaging and staple  removal Call office to confirm appointment [place] , PA-C 04/14/2020, 6:30 AM

## 2020-04-14 NOTE — NC FL2 (Signed)
[place] MEDICAID FL2 LEVEL OF CARE SCREENING TOOL     IDENTIFICATION  Patient Name: Michael Kane Birthdate: [birth year] Sex: male Admission Date (Current Location): 04/11/2020  [facility] and [place] Number:  Chiropodist and Address:  [facility], [address], [place], [place] [address]      Provider Number: [id number]  Attending Physician Name and Address:  Cipriano Bunker, MD  Relative Name and Phone Number:  Delila Pereyra [phone]    Current Level of Care: Hospital Recommended Level of Care: Skilled Nursing Facility Prior Approval Number:    Date Approved/Denied:   PASRR Number:    Discharge Plan: SNF    Current Diagnoses: Patient Active Problem List   Diagnosis Date Noted   Malnutrition of moderate degree 04/13/2020   Closed left hip fracture (HCC) 04/11/2020   Depression 04/11/2020   Stage 3a chronic kidney disease (HCC) 10/15/2019   History of prostate cancer 08/10/2017   Mood disorder (HCC) 05/27/2015   Aortic stenosis 06/24/2012   Vascular dementia (HCC) 05/19/2008   Essential hypertension, benign 06/09/2007   WOLFF (WOLFE)-PARKINSON-WHITE (WPW) SYNDROME 06/09/2007   OSA (obstructive sleep apnea) 06/09/2007    Orientation RESPIRATION BLADDER Height & Weight     Self,Place  Normal External catheter Weight: 78.9 kg Height:  5\' 10"  (177.8 cm)  BEHAVIORAL SYMPTOMS/MOOD NEUROLOGICAL BOWEL NUTRITION STATUS      Continent Diet (Regular)  AMBULATORY STATUS COMMUNICATION OF NEEDS Skin   Extensive Assist Verbally Surgical wounds                       Personal Care Assistance Level of Assistance  Bathing,Feeding,Dressing Bathing Assistance: Maximum assistance Feeding assistance: Limited assistance Dressing Assistance: Maximum assistance     Functional Limitations Info             SPECIAL CARE FACTORS FREQUENCY  PT (By licensed PT),OT (By licensed OT)                    Contractures  Contractures Info: Not present    Additional Factors Info  Code Status,Allergies Code Status Info: DNR Allergies Info: Lisinopril, Lactose Intolerance           Current Medications (04/14/2020):  This is the current hospital active medication list Current Facility-Administered Medications  Medication Dose Route Frequency Provider Last Rate Last Admin   acetaminophen (TYLENOL) tablet 325-650 mg  325-650 mg Oral Q6H PRN Lyndle Herrlich, MD       amLODipine (NORVASC) tablet 10 mg  10 mg Oral Daily Lyndle Herrlich, MD   10 mg at 04/13/20 3532   Chlorhexidine Gluconate Cloth 2 % PADS 6 each  6 each Topical Daily Lyndle Herrlich, MD   6 each at 04/13/20 0859   cholestyramine (QUESTRAN) packet 4 g  4 g Oral QHS Lyndle Herrlich, MD   4 g at 04/13/20 2213   docusate sodium (COLACE) capsule 100 mg  100 mg Oral BID Lyndle Herrlich, MD   100 mg at 04/13/20 2213   enoxaparin (LOVENOX) injection 40 mg  40 mg Subcutaneous Q24H Lyndle Herrlich, MD   40 mg at 04/13/20 0902   feeding supplement (ENSURE ENLIVE / ENSURE PLUS) liquid 237 mL  237 mL Oral BID BM Adhikari, Amrit, MD   237 mL at 04/13/20 1330   hydrochlorothiazide (HYDRODIURIL) tablet 25 mg  25 mg Oral Daily Lyndle Herrlich, MD   25 mg at 04/13/20 [phone]  HYDROcodone-acetaminophen (NORCO/VICODIN) 5-325 MG per tablet 1-2 tablet  1-2 tablet Oral Q6H PRN Lovell Sheehan, MD   1 tablet at 04/11/20 2011   memantine (NAMENDA) tablet 5 mg  5 mg Oral BID Lovell Sheehan, MD   5 mg at 04/13/20 2213   methocarbamol (ROBAXIN) tablet 500 mg  500 mg Oral Q6H PRN Lovell Sheehan, MD       Or   methocarbamol (ROBAXIN) 500 mg in dextrose 5 % 50 mL IVPB  500 mg Intravenous Q6H PRN Lovell Sheehan, MD       metoCLOPramide (REGLAN) tablet 5-10 mg  5-10 mg Oral Q8H PRN Lovell Sheehan, MD       Or   metoCLOPramide (REGLAN) injection 5-10 mg  5-10 mg Intravenous Q8H PRN Lovell Sheehan, MD       morphine 2 MG/ML injection 0.5 mg  0.5 mg Intravenous  Q2H PRN Lovell Sheehan, MD   0.5 mg at 04/12/20 4174   multivitamin with minerals tablet 1 tablet  1 tablet Oral Daily Shelly Coss, MD   1 tablet at 04/13/20 0902   ondansetron (ZOFRAN) tablet 4 mg  4 mg Oral Q6H PRN Lovell Sheehan, MD       Or   ondansetron [facility]) injection 4 mg  4 mg Intravenous Q6H PRN Lovell Sheehan, MD       senna Donavan Burnet) tablet 8.6 mg  1 tablet Oral BID Lovell Sheehan, MD   8.6 mg at 04/13/20 2213   sertraline (ZOLOFT) tablet 25 mg  25 mg Oral Daily Lovell Sheehan, MD   25 mg at 04/13/20 0814     Discharge Medications: Please see discharge summary for a list of discharge medications.  Relevant Imaging Results:  Relevant Lab Results:   Additional Information SS# [id number]  Shelbie Ammons, RN

## 2020-04-14 NOTE — Progress Notes (Addendum)
Physical Therapy Treatment Patient Details Name: Michael Kane MRN: [id number] DOB: 25-Dec-1928 Today's Date: 04/14/2020    History of Present Illness Pt is 86 y/o M with PMH: HTN, dementia, stage IIIa CKD, WPW syndrome who presents to the emergency department after a fall at nursing facility with c/o L hip pain. Pt now s/p L hip IM nailing (04/12/20), WBAT.    PT Comments    Pt was asleep long sitting in bed with supportive daughter at bedside. He agrees to PT session and is cooperative but confused. Is HOH but able to converse. He was able to exit L side of bed with increased time + mod-max assist. Stood 2 x EOB and stand pivot to recliner with mod assist. Pt struggles with LE progression with minimal knee buckling present. Plan is to DC to rehab tomorrow pr daughter. Pt will benefit from SNF at DC to address deficits while assisting pt to PLOF. Acute PT will continue to follow per POC.    Follow Up Recommendations  SNF     Equipment Recommendations  Rolling walker with 5" wheels;3in1 (PT)    Recommendations for Other Services       Precautions / Restrictions Precautions Precautions: Fall Restrictions Weight Bearing Restrictions: Yes    Mobility  Bed Mobility Overal bed mobility: Needs Assistance Bed Mobility: Supine to Sit     Supine to sit: Mod assist;Max assist     General bed mobility comments: extensive assist for L LE management, truncal elevation  Transfers Overall transfer level: Needs assistance Equipment used: Rolling walker (2 wheeled) Transfers: Sit to/from Stand Sit to Stand: From elevated surface;Min assist     Ambulation/Gait Ambulation/Gait assistance: Mod assist Gait Distance (Feet): 3 Feet Assistive device: Rolling walker (2 wheeled) Gait Pattern/deviations: Step-to pattern Gait velocity: decreased   General Gait Details: Pt was able to take a few shuffling steps to recliner from EOB however increased time required with vcs throughout for  posture correctiong and sequencing.      Balance Overall balance assessment: Needs assistance Sitting-balance support: No upper extremity supported;Feet supported Sitting balance-Leahy Scale: Good Sitting balance - Comments: no LOB seated EOB   Standing balance support: Bilateral upper extremity supported Standing balance-Leahy Scale: Fair Standing balance comment: reliant on BUE support       Cognition Arousal/Alertness: Awake/alert Behavior During Therapy: WFL for tasks assessed/performed Overall Cognitive Status: History of cognitive impairments - at baseline      General Comments: Alert and oriented to self only; follows commands and participates well with increased time             Pertinent Vitals/Pain Pain Assessment: No/denies pain Faces Pain Scale: Hurts a little bit Pain Location: L hip Pain Descriptors / Indicators: Aching;Tender Pain Intervention(s): Limited activity within patient's tolerance;Monitored during session;Premedicated before session;Repositioned;Ice applied           PT Goals (current goals can now be found in the care plan section) Acute Rehab PT Goals Patient Stated Goal: to get stronger Progress towards PT goals: Progressing toward goals    Frequency    7X/week      PT Plan Current plan remains appropriate       AM-PAC PT "6 Clicks" Mobility   Outcome Measure  Help needed turning from your back to your side while in a flat bed without using bedrails?: A Lot Help needed moving from lying on your back to sitting on the side of a flat bed without using bedrails?: A Lot Help needed moving  to and from a bed to a chair (including a wheelchair)?: A Lot Help needed standing up from a chair using your arms (e.g., wheelchair or bedside chair)?: A Lot Help needed to walk in hospital room?: A Lot Help needed climbing 3-5 steps with a railing? : A Lot 6 Click Score: 12    End of Session Equipment Utilized During Treatment: Gait  belt Activity Tolerance: Patient tolerated treatment well Patient left: in chair;with call [place]/phone within reach;with chair alarm set Nurse Communication: Mobility status PT Visit Diagnosis: Muscle weakness (generalized) (M62.81);Difficulty in walking, not elsewhere classified (R26.2)     Time: [id number] PT Time Calculation (min) (ACUTE ONLY): 23 min  Charges:  $Therapeutic Activity: 23-37 mins                     Julaine Fusi PTA 04/14/20, 1:49 PM

## 2020-04-15 DIAGNOSIS — R278 Other lack of coordination: Secondary | ICD-10-CM | POA: Diagnosis not present

## 2020-04-15 DIAGNOSIS — R2681 Unsteadiness on feet: Secondary | ICD-10-CM | POA: Diagnosis not present

## 2020-04-15 DIAGNOSIS — F39 Unspecified mood [affective] disorder: Secondary | ICD-10-CM | POA: Diagnosis not present

## 2020-04-15 DIAGNOSIS — F0150 Vascular dementia without behavioral disturbance: Secondary | ICD-10-CM | POA: Diagnosis not present

## 2020-04-15 DIAGNOSIS — E7801 Familial hypercholesterolemia: Secondary | ICD-10-CM | POA: Diagnosis not present

## 2020-04-15 DIAGNOSIS — R262 Difficulty in walking, not elsewhere classified: Secondary | ICD-10-CM | POA: Diagnosis not present

## 2020-04-15 DIAGNOSIS — R338 Other retention of urine: Secondary | ICD-10-CM | POA: Diagnosis not present

## 2020-04-15 DIAGNOSIS — I10 Essential (primary) hypertension: Secondary | ICD-10-CM | POA: Diagnosis not present

## 2020-04-15 DIAGNOSIS — S72002A Fracture of unspecified part of neck of left femur, initial encounter for closed fracture: Secondary | ICD-10-CM | POA: Diagnosis not present

## 2020-04-15 DIAGNOSIS — S7292XA Unspecified fracture of left femur, initial encounter for closed fracture: Secondary | ICD-10-CM | POA: Diagnosis not present

## 2020-04-15 DIAGNOSIS — Z741 Need for assistance with personal care: Secondary | ICD-10-CM | POA: Diagnosis not present

## 2020-04-15 DIAGNOSIS — R279 Unspecified lack of coordination: Secondary | ICD-10-CM | POA: Diagnosis not present

## 2020-04-15 DIAGNOSIS — S72145D Nondisplaced intertrochanteric fracture of left femur, subsequent encounter for closed fracture with routine healing: Secondary | ICD-10-CM | POA: Diagnosis not present

## 2020-04-15 DIAGNOSIS — M6281 Muscle weakness (generalized): Secondary | ICD-10-CM | POA: Diagnosis not present

## 2020-04-15 DIAGNOSIS — B351 Tinea unguium: Secondary | ICD-10-CM | POA: Diagnosis not present

## 2020-04-15 DIAGNOSIS — R5381 Other malaise: Secondary | ICD-10-CM | POA: Diagnosis not present

## 2020-04-15 NOTE — Progress Notes (Signed)
Physical Therapy Treatment Patient Details Name: Michael Kane MRN: [id number] DOB: [birth year] Today's Date: 04/15/2020    History of Present Illness Pt is 86 y/o M with PMH: HTN, dementia, stage IIIa CKD, WPW syndrome who presents to the emergency department after a fall at nursing facility with c/o L hip pain. Pt now s/p L hip IM nailing (04/12/20), WBAT.    PT Comments    Pt was supine in bed asleep upon arriving. He easily awakes and agrees to PT session. Pt is confused however pleasant and willing to perform all desired task. Does require extensive assistance to exit bed. Stood from elevated bed height to RW with min assist however required mod assist from lower recliner height. Increased time to perform all standing/wt bearing task 2/2 to pain. He was able to ambulate ~ 8 ft with very antalgic shuffling gait pattern. Pain most limiting. Per daughter (previous date) "He ambulated with shuffling prior to admission." Overall pt tolerated session well and will benefit from continues skilled PT to address deficits while assisting pt to PLOF. Recommend SNF at DC.    Follow Up Recommendations  SNF     Equipment Recommendations  Other (comment) (defer to next level of care)    Recommendations for Other Services       Precautions / Restrictions Precautions Precautions: Fall Restrictions Weight Bearing Restrictions: Yes LLE Weight Bearing: Weight bearing as tolerated    Mobility  Bed Mobility Overal bed mobility: Needs Assistance Bed Mobility: Supine to Sit     Supine to sit: Mod assist;Max assist     General bed mobility comments: Pt required mod-max assist to exit bed with increased time and max vcs for technique. pain limiting  Transfers Overall transfer level: Needs assistance Equipment used: Rolling walker (2 wheeled) Transfers: Sit to/from Stand Sit to Stand: From elevated surface;Min assist;Mod assist         General transfer comment: Min assist to stand from  elevated bed height. does require increased assistance from lower recliner height. needs increased time for priocessing and vcs for safety throughout.  Ambulation/Gait Ambulation/Gait assistance: Min assist Gait Distance (Feet): 8 Feet Assistive device: Rolling walker (2 wheeled) Gait Pattern/deviations: Step-to pattern;Trunk flexed;Shuffle;Antalgic Gait velocity: decreased   General Gait Details: Pt was able to ambulate ~ 8 ft with poor gait kinematics. pt very limited by pain. poor foot clearance during steps with max vcs for posture coreection and lateral wt shifting.       Balance Overall balance assessment: Needs assistance Sitting-balance support: No upper extremity supported;Feet supported Sitting balance-Leahy Scale: Good Sitting balance - Comments: no LOB once sitting EOB. initial posterior lean upon sitting up 2/2 to pain   Standing balance support: Bilateral upper extremity supported;During functional activity Standing balance-Leahy Scale: Fair Standing balance comment: reliant on BUE support         Cognition Arousal/Alertness: Awake/alert Behavior During Therapy: WFL for tasks assessed/performed Overall Cognitive Status: History of cognitive impairments - at baseline        General Comments: Alert and oriented to self only; follows commands and participates well with increased time         General Comments General comments (skin integrity, edema, etc.): Reviewed importance and pt perform several exercises in recliner.      Pertinent Vitals/Pain Pain Assessment: Faces Faces Pain Scale: Hurts little more Pain Location: L hip Pain Descriptors / Indicators: Aching;Tender Pain Intervention(s): Limited activity within patient's tolerance;Monitored during session;Premedicated before session;Repositioned  PT Goals (current goals can now be found in the care plan section) Acute Rehab PT Goals Patient Stated Goal: none stated Progress towards PT  goals: Progressing toward goals    Frequency    7X/week      PT Plan Current plan remains appropriate       AM-PAC PT "6 Clicks" Mobility   Outcome Measure  Help needed turning from your back to your side while in a flat bed without using bedrails?: A Lot Help needed moving from lying on your back to sitting on the side of a flat bed without using bedrails?: A Lot Help needed moving to and from a bed to a chair (including a wheelchair)?: A Lot Help needed standing up from a chair using your arms (e.g., wheelchair or bedside chair)?: A Little Help needed to walk in hospital room?: A Little Help needed climbing 3-5 steps with a railing? : A Lot 6 Click Score: 14    End of Session Equipment Utilized During Treatment: Gait belt Activity Tolerance: Patient tolerated treatment well Patient left: in chair;with call [place]/phone within reach;with chair alarm set Nurse Communication: Mobility status PT Visit Diagnosis: Muscle weakness (generalized) (M62.81);Difficulty in walking, not elsewhere classified (R26.2)     Time: 3295-1884 PT Time Calculation (min) (ACUTE ONLY): 29 min  Charges:  $Therapeutic Exercise: 8-22 mins $Therapeutic Activity: 8-22 mins                     Julaine Fusi PTA 04/15/20, 8:50 AM

## 2020-04-15 NOTE — TOC Initial Note (Signed)
Transition of Care [facility]) - Initial/Assessment Note    Patient Details  Name: OCTAVE MONTROSE MRN: [id number] Date of Birth: 21-Sep-1928  [facility]) CM/SW Contact:    Shelbie Ammons, RN Phone Number: 04/15/2020, 8:18 AM  Clinical Narrative:  Patient is a resident of memory care at [facility], he will go to their skilled nursing at discharge. RNCM received insurance authorization through [employer] portal with auth ID of [id number] and reference number of [id number]. The authorization is good 12/23 with a next review date of 12/28. He will go to room 101 and the phone number for report is [phone]. RNCM will complete discharge paperwork and call for transport when ready.                        Patient Goals and CMS Choice        Expected Discharge Plan and Services           Expected Discharge Date: 04/14/20                                    Prior Living Arrangements/Services                       Activities of Daily Living Home Assistive Devices/Equipment: None ADL Screening (condition at time of admission) Patient's cognitive ability adequate to safely complete daily activities?: Yes Is the patient deaf or have difficulty hearing?: No Does the patient have difficulty seeing, even when wearing glasses/contacts?: No Does the patient have difficulty concentrating, remembering, or making decisions?: Yes Patient able to express need for assistance with ADLs?: Yes Does the patient have difficulty dressing or bathing?: No Independently performs ADLs?: Yes (appropriate for developmental age) Does the patient have difficulty walking or climbing stairs?: Yes Weakness of Legs: Both Weakness of Arms/Hands: None  Permission Sought/Granted                  Emotional Assessment              Admission diagnosis:  Fall [W19.XXXA] Vascular dementia without behavioral disturbance ([place]) [F01.50] Closed left hip fracture ([place]) [S72.002A] Closed  fracture of left hip, initial encounter [facility]) [S72.002A] Patient Active Problem List   Diagnosis Date Noted  . Malnutrition of moderate degree 04/13/2020  . Closed left hip fracture ([place]) 04/11/2020  . Depression 04/11/2020  . Stage 3a chronic kidney disease ([place]) 10/15/2019  . History of prostate cancer 08/10/2017  . Mood disorder ([place]) 05/27/2015  . Aortic stenosis 06/24/2012  . Vascular dementia ([place]) 05/19/2008  . Essential hypertension, benign 06/09/2007  . WOLFF (WOLFE)-PARKINSON-WHITE (WPW) SYNDROME 06/09/2007  . OSA (obstructive sleep apnea) 06/09/2007   PCP:  Venia Carbon, MD Pharmacy:   [id number] [address]ina [address]on, [place] [address] [place] [address] Phone: [phone] Fax: [place], [place] [address] Phone: [phone] Fax: [phone]     Social Determinants of Health (SDOH) Interventions    Readmission Risk Interventions No flowsheet data found.

## 2020-04-15 NOTE — Progress Notes (Signed)
PROGRESS NOTE    Michael Kane  NWG:[id number] DOB: 08-16-1928 DOA: 04/11/2020 PCP: Venia Carbon, MD   Brief Narrative: This  86 year old male with history of hypertension, dementia, stage IIIa CKD, WPW syndrome who presents to the emergency department after a fall at nursing facility. He complained of left hip pain after the fall.  He was unable to get up after falling due to significant pain in the left hip.  There was no report of loss of consciousness or head injury.  Left hip x-ray showed intertrochanteric fracture. Orthopedics consulted.    Patient underwent ORIF, postoperative day 3.  Patient has developed intermittent urinary retention requiring in and out cath postoperatively.  Assessment & Plan:   Principal Problem:   Closed left hip fracture (HCC) Active Problems:   Vascular dementia ([place])   Essential hypertension, benign   Aortic stenosis   Stage 3a chronic kidney disease (HCC)   Depression   Malnutrition of moderate degree   Fall/left hip fracture: Orthopedics consulted.  X-ray showed left intertrochanteric fracture.  Orthopedics consulted, patient underwent ORIF.  Postoperative day 3.  Continue pain management, supportive care.  DVT prophylaxis with Lovenox after the surgery.  PT/OT consultation after the surgery.  TOC will be consulted.  Intermittent urinary retention: Patient has developed intermittent urinary retention requiring in and out cath postoperatively.  We hope with ambulation this might resolve.  Confirm with nursing home and they states they will encourage the patient to ambulate and consider in and out cath if needed.  Hypertension: Blood pressure improved. Continue to monitor blood pressure.  On amlodipine and hydrochlorothiazide.  Stage III a CKD: Currently kidney function at baseline.  Dementia/depression: Continue Zoloft and Namenda. Delirium  precautions.  Continue supportive care.  Hypokalemia: Replaced.   Potassium improved   DVT  prophylaxis: SCDs Code Status: Full code Family Communication: Wife is at bedside Disposition Plan:  Status is: Inpatient  Remains inpatient appropriate because:Inpatient level of care appropriate due to severity of illness   Dispo: The patient is from: Home              Anticipated d/c is to: SNF              Anticipated d/c date is: 04/15/20              Patient currently is medically clear for discharge to SNF   Consultants:  Orthopedics  Procedures: ORIF Antimicrobials:  Anti-infectives (From admission, onward)   Start     Dose/Rate Route Frequency Ordered Stop   04/12/20 1800  ceFAZolin (ANCEF) IVPB 2g/100 mL premix        2 g 200 mL/hr over 30 Minutes Intravenous Every 6 hours 04/12/20 1526 04/13/20 0559   04/11/20 1555  ceFAZolin (ANCEF) IVPB 2g/100 mL premix        2 g 200 mL/hr over 30 Minutes Intravenous 30 min pre-op 04/11/20 1556 04/12/20 1224      Subjective: Patient was seen and examined at bedside.  Overnight events noted.  Patient is a s/p ORIF postoperative day 3.   Patient has developed intermittent urinary retention requiring in and out cath.   Patient reports he feels the urge to pee,  does not want Foley catheter.  Objective: Vitals:   04/14/20 1623 04/14/20 1943 04/14/20 2322 04/15/20 0400  BP: (!) 156/74 (!) 160/69 (!) 155/84 (!) 162/76  Pulse: 90 77 88 72  Resp: 16 16 16 15   Temp: 98.2 F (36.8 C) 98.3 F (36.8 C)  98.5 F (36.9 C) 97.9 F (36.6 C)  TempSrc:    Oral  SpO2: 94% 93% 92% 94%  Weight:      Height:        Intake/Output Summary (Last 24 hours) at 04/15/2020 1000 Last data filed at 04/14/2020 1900 Gross per 24 hour  Intake 480 ml  Output 400 ml  Net 80 ml   Filed Weights   04/11/20 1204 04/12/20 1416  Weight: 81.6 kg 78.9 kg    Examination:  General exam: Appears calm and comfortable , not in any distress Respiratory system: Clear to auscultation. Respiratory effort normal. Cardiovascular system: S1 & S2 heard,  RRR. No JVD, murmurs, rubs, gallops or clicks. No pedal edema. Gastrointestinal system: Abdomen is nondistended, soft and nontender. No organomegaly or masses felt.  Normal bowel sounds heard. Central nervous system: Alert and oriented. No focal neurological deficits. Extremities: S/p ORIF.  No edema, no cyanosis, no clubbing. Skin: No rashes, lesions or ulcers Psychiatry: Judgement and insight appear normal. Mood & affect appropriate.     Data Reviewed: I have personally reviewed following labs and imaging studies  CBC: Recent Labs  Lab 04/11/20 1206 04/12/20 0539 04/13/20 0610 04/14/20 0838  WBC 8.6 11.1* 11.1* 12.4*  NEUTROABS 4.6  --  8.4*  --   HGB 15.3 13.7 13.1 12.8*  HCT 44.0 38.6* 38.0* 36.2*  MCV 91.9 90.6 91.6 91.6  PLT 228 202 183 [id number]   Basic Metabolic Panel: Recent Labs  Lab 04/11/20 1206 04/12/20 0539 04/13/20 0610 04/14/20 0838  NA 138 135 134* 132*  K 3.8 3.2* 3.2* 3.5  CL 100 98 97* 97*  CO2 26 27 26 26   GLUCOSE 111* 132* 128* 111*  BUN 24* 22 16 18   CREATININE 1.13 0.92 0.83 0.79  CALCIUM 9.5 8.7* 8.6* 8.3*  MG  --   --   --  2.0  PHOS  --   --   --  2.4*   GFR: Estimated Creatinine Clearance: 64.6 mL/min (by C-G formula based on SCr of 0.79 mg/dL). Liver Function Tests: No results for input(s): AST, ALT, ALKPHOS, BILITOT, PROT, ALBUMIN in the last 168 hours. No results for input(s): LIPASE, AMYLASE in the last 168 hours. No results for input(s): AMMONIA in the last 168 hours. Coagulation Profile: No results for input(s): INR, PROTIME in the last 168 hours. Cardiac Enzymes: No results for input(s): CKTOTAL, CKMB, CKMBINDEX, TROPONINI in the last 168 hours. BNP (last 3 results) No results for input(s): PROBNP in the last 8760 hours. HbA1C: No results for input(s): HGBA1C in the last 72 hours. CBG: No results for input(s): GLUCAP in the last 168 hours. Lipid Profile: No results for input(s): CHOL, HDL, LDLCALC, TRIG, CHOLHDL, LDLDIRECT in  the last 72 hours. Thyroid Function Tests: No results for input(s): TSH, T4TOTAL, FREET4, T3FREE, THYROIDAB in the last 72 hours. Anemia Panel: No results for input(s): VITAMINB12, FOLATE, FERRITIN, TIBC, IRON, RETICCTPCT in the last 72 hours. Sepsis Labs: No results for input(s): PROCALCITON, LATICACIDVEN in the last 168 hours.  Recent Results (from the past 240 hour(s))  Resp Panel by RT-PCR (Flu A&B, Covid) Nasopharyngeal Swab     Status: None   Collection Time: 04/11/20 12:06 PM   Specimen: Nasopharyngeal Swab; Nasopharyngeal(NP) swabs in vial transport medium  Result Value Ref Range Status   SARS Coronavirus 2 by RT PCR NEGATIVE NEGATIVE Final    Comment: (NOTE) SARS-CoV-2 target nucleic acids are NOT DETECTED.  The SARS-CoV-2 RNA is generally detectable in upper  respiratory specimens during the acute phase of infection. The lowest concentration of SARS-CoV-2 viral copies this assay can detect is 138 copies/mL. A negative result does not preclude SARS-Cov-2 infection and should not be used as the sole basis for treatment or other patient management decisions. A negative result may occur with  improper specimen collection/handling, submission of specimen other than nasopharyngeal swab, presence of viral mutation(s) within the areas targeted by this assay, and inadequate number of viral copies(<138 copies/mL). A negative result must be combined with clinical observations, patient history, and epidemiological information. The expected result is Negative.  Fact Sheet for Patients:  EntrepreneurPulse.com.au  Fact Sheet for Healthcare Providers:  IncredibleEmployment.be  This test is no t yet approved or cleared by the [place] FDA and  has been authorized for detection and/or diagnosis of SARS-CoV-2 by FDA under an Emergency Use Authorization (EUA). This EUA will remain  in effect (meaning this test can be used) for the duration of  the COVID-19 declaration under Section 564(b)(1) of the Act, 21 U.S.C.section 360bbb-3(b)(1), unless the authorization is terminated  or revoked sooner.       Influenza A by PCR NEGATIVE NEGATIVE Final   Influenza B by PCR NEGATIVE NEGATIVE Final    Comment: (NOTE) The Xpert Xpress SARS-CoV-2/FLU/RSV plus assay is intended as an aid in the diagnosis of influenza from Nasopharyngeal swab specimens and should not be used as a sole basis for treatment. Nasal washings and aspirates are unacceptable for Xpert Xpress SARS-CoV-2/FLU/RSV testing.  Fact Sheet for Patients: EntrepreneurPulse.com.au  Fact Sheet for Healthcare Providers: IncredibleEmployment.be  This test is not yet approved or cleared by the [place] FDA and has been authorized for detection and/or diagnosis of SARS-CoV-2 by FDA under an Emergency Use Authorization (EUA). This EUA will remain in effect (meaning this test can be used) for the duration of the COVID-19 declaration under Section 564(b)(1) of the Act, 21 U.S.C. section 360bbb-3(b)(1), unless the authorization is terminated or revoked.  Performed at [facility], [address] [place], [place] [address]   MRSA PCR Screening     Status: None   Collection Time: 04/11/20  4:49 PM   Specimen: Nasopharyngeal  Result Value Ref Range Status   MRSA by PCR NEGATIVE NEGATIVE Final    Comment:        The GeneXpert MRSA Assay (FDA approved for NASAL specimens only), is one component of a comprehensive MRSA colonization surveillance program. It is not intended to diagnose MRSA infection nor to guide or monitor treatment for MRSA infections. Performed at [facility], [address] [place], [place] [address]   Resp Panel by RT-PCR (Flu A&B, Covid) Nasopharyngeal Swab     Status: None   Collection Time: 04/14/20 12:33 PM   Specimen: Nasopharyngeal Swab; Nasopharyngeal(NP) swabs in vial transport  medium  Result Value Ref Range Status   SARS Coronavirus 2 by RT PCR NEGATIVE NEGATIVE Final    Comment: (NOTE) SARS-CoV-2 target nucleic acids are NOT DETECTED.  The SARS-CoV-2 RNA is generally detectable in upper respiratory specimens during the acute phase of infection. The lowest concentration of SARS-CoV-2 viral copies this assay can detect is 138 copies/mL. A negative result does not preclude SARS-Cov-2 infection and should not be used as the sole basis for treatment or other patient management decisions. A negative result may occur with  improper specimen collection/handling, submission of specimen other than nasopharyngeal swab, presence of viral mutation(s) within the areas targeted by this assay, and inadequate number of  viral copies(<138 copies/mL). A negative result must be combined with clinical observations, patient history, and epidemiological information. The expected result is Negative.  Fact Sheet for Patients:  EntrepreneurPulse.com.au  Fact Sheet for Healthcare Providers:  IncredibleEmployment.be  This test is no t yet approved or cleared by the [place] FDA and  has been authorized for detection and/or diagnosis of SARS-CoV-2 by FDA under an Emergency Use Authorization (EUA). This EUA will remain  in effect (meaning this test can be used) for the duration of the COVID-19 declaration under Section 564(b)(1) of the Act, 21 U.S.C.section 360bbb-3(b)(1), unless the authorization is terminated  or revoked sooner.       Influenza A by PCR NEGATIVE NEGATIVE Final   Influenza B by PCR NEGATIVE NEGATIVE Final    Comment: (NOTE) The Xpert Xpress SARS-CoV-2/FLU/RSV plus assay is intended as an aid in the diagnosis of influenza from Nasopharyngeal swab specimens and should not be used as a sole basis for treatment. Nasal washings and aspirates are unacceptable for Xpert Xpress SARS-CoV-2/FLU/RSV testing.  Fact Sheet for  Patients: EntrepreneurPulse.com.au  Fact Sheet for Healthcare Providers: IncredibleEmployment.be  This test is not yet approved or cleared by the [place] FDA and has been authorized for detection and/or diagnosis of SARS-CoV-2 by FDA under an Emergency Use Authorization (EUA). This EUA will remain in effect (meaning this test can be used) for the duration of the COVID-19 declaration under Section 564(b)(1) of the Act, 21 U.S.C. section 360bbb-3(b)(1), unless the authorization is terminated or revoked.  Performed at [facility], [address] [place], [place] [address]       Radiology Studies: No results found.  Scheduled Meds: . amLODipine  10 mg Oral Daily  . Chlorhexidine Gluconate Cloth  6 each Topical Daily  . cholestyramine  4 g Oral QHS  . docusate sodium  100 mg Oral BID  . enoxaparin (LOVENOX) injection  40 mg Subcutaneous Q24H  . feeding supplement  237 mL Oral BID BM  . hydrochlorothiazide  25 mg Oral Daily  . memantine  5 mg Oral BID  . multivitamin with minerals  1 tablet Oral Daily  . senna  1 tablet Oral BID  . sertraline  25 mg Oral Daily   Continuous Infusions: . methocarbamol (ROBAXIN) IV       LOS: 4 days    Time spent: 25 mins    Shawna Clamp, MD [facility]   If 7PM-7AM, please contact night-coverage

## 2020-04-15 NOTE — Care Management Important Message (Signed)
Important Message  Patient Details  Name: Michael Kane MRN: [id number] Date of Birth: 09/06/1928   Medicare Important Message Given:  Yes     Juliann Pulse A Chyrel Taha 04/15/2020, 12:39 PM

## 2020-04-15 NOTE — Progress Notes (Signed)
  Subjective:  POD #3  s/p intramedullary fixation for left IT hip fracture.   Patient's daughter is at the bedside.  Patient reports left hip pain as mild to moderate.  Patient up out of bed to a chair.  Patient with urinary hesitancy overnight.  Patient is from [facility] and will turn there upon discharge.  Objective:   VITALS:   Vitals:   04/14/20 1623 04/14/20 1943 04/14/20 2322 04/15/20 0400  BP: (!) 156/74 (!) 160/69 (!) 155/84 (!) 162/76  Pulse: 90 77 88 72  Resp: 16 16 16 15   Temp: 98.2 F (36.8 C) 98.3 F (36.8 C) 98.5 F (36.9 C) 97.9 F (36.6 C)  TempSrc:    Oral  SpO2: 94% 93% 92% 94%  Weight:      Height:        PHYSICAL EXAM: Left lower extremity Neurovascular intact Sensation intact distally Intact pulses distally Dorsiflexion/Plantar flexion intact Aquacel Dressing: scant drainage No cellulitis present Compartment soft  LABS  Results for orders placed or performed during the hospital encounter of 04/11/20 (from the past 24 hour(s))  Resp Panel by RT-PCR (Flu A&B, Covid) Nasopharyngeal Swab     Status: None   Collection Time: 04/14/20 12:33 PM   Specimen: Nasopharyngeal Swab; Nasopharyngeal(NP) swabs in vial transport medium  Result Value Ref Range   SARS Coronavirus 2 by RT PCR NEGATIVE NEGATIVE   Influenza A by PCR NEGATIVE NEGATIVE   Influenza B by PCR NEGATIVE NEGATIVE    No results found.  Assessment/Plan: 3 Days Post-Op   Principal Problem:   Closed left hip fracture (HCC) Active Problems:   Vascular dementia (HCC)   Essential hypertension, benign   Aortic stenosis   Stage 3a chronic kidney disease (HCC)   Depression   Malnutrition of moderate degree  Patient doing well from orthopedic standpoint.  He may be discharged when cleared medically.  Patient may be weightbearing as tolerated left lower extremity.  Patient will follow up at [facility] on 04/26/2020 for staple removal and x-ray.  Patient should continue Lovenox 40 mg  daily for 2 weeks starting on 04/13/2020.    Thornton Park , MD 04/15/2020, 10:57 AM

## 2020-04-15 NOTE — Progress Notes (Addendum)
Bladder scan done indicating 58mls. Pt declined intermittent catheterization. Multiple staff attempted to encourage pt without success. Pt reported that he does not have any difficulty passing urine. Pt denied any discomfort at this time. Medical Provider notified via securechat that the pt had refused. Will continue to offer.  0629am: Pt used the commode and passed urine and had a large bowel movement. Unable to accurately measure urine content. Pt refused post void bladder scan stating that he was comfortable. Pt ambulated with a shuffling gait and required maximum assistance.

## 2020-04-18 DIAGNOSIS — F0150 Vascular dementia without behavioral disturbance: Secondary | ICD-10-CM | POA: Diagnosis not present

## 2020-04-18 DIAGNOSIS — S7292XA Unspecified fracture of left femur, initial encounter for closed fracture: Secondary | ICD-10-CM | POA: Diagnosis not present

## 2020-04-18 DIAGNOSIS — I10 Essential (primary) hypertension: Secondary | ICD-10-CM | POA: Diagnosis not present

## 2020-04-18 DIAGNOSIS — F39 Unspecified mood [affective] disorder: Secondary | ICD-10-CM | POA: Diagnosis not present

## 2020-04-20 DIAGNOSIS — B351 Tinea unguium: Secondary | ICD-10-CM | POA: Diagnosis not present

## 2020-04-21 DIAGNOSIS — E7801 Familial hypercholesterolemia: Secondary | ICD-10-CM | POA: Diagnosis not present

## 2020-05-03 DIAGNOSIS — Z96642 Presence of left artificial hip joint: Secondary | ICD-10-CM | POA: Diagnosis not present

## 2020-05-04 DIAGNOSIS — R2681 Unsteadiness on feet: Secondary | ICD-10-CM | POA: Diagnosis not present

## 2020-05-04 DIAGNOSIS — R278 Other lack of coordination: Secondary | ICD-10-CM | POA: Diagnosis not present

## 2020-05-04 DIAGNOSIS — R262 Difficulty in walking, not elsewhere classified: Secondary | ICD-10-CM | POA: Diagnosis not present

## 2020-05-04 DIAGNOSIS — I10 Essential (primary) hypertension: Secondary | ICD-10-CM | POA: Diagnosis not present

## 2020-05-04 DIAGNOSIS — R338 Other retention of urine: Secondary | ICD-10-CM | POA: Diagnosis not present

## 2020-05-04 DIAGNOSIS — Z741 Need for assistance with personal care: Secondary | ICD-10-CM | POA: Diagnosis not present

## 2020-05-04 DIAGNOSIS — S72145D Nondisplaced intertrochanteric fracture of left femur, subsequent encounter for closed fracture with routine healing: Secondary | ICD-10-CM | POA: Diagnosis not present

## 2020-05-05 DIAGNOSIS — S7292XA Unspecified fracture of left femur, initial encounter for closed fracture: Secondary | ICD-10-CM | POA: Diagnosis not present

## 2020-05-05 DIAGNOSIS — F0150 Vascular dementia without behavioral disturbance: Secondary | ICD-10-CM

## 2020-05-05 DIAGNOSIS — F39 Unspecified mood [affective] disorder: Secondary | ICD-10-CM

## 2020-05-05 DIAGNOSIS — I10 Essential (primary) hypertension: Secondary | ICD-10-CM | POA: Diagnosis not present

## 2020-05-05 DIAGNOSIS — I352 Nonrheumatic aortic (valve) stenosis with insufficiency: Secondary | ICD-10-CM | POA: Diagnosis not present

## 2020-05-06 DIAGNOSIS — R2681 Unsteadiness on feet: Secondary | ICD-10-CM | POA: Diagnosis not present

## 2020-05-06 DIAGNOSIS — R278 Other lack of coordination: Secondary | ICD-10-CM | POA: Diagnosis not present

## 2020-05-06 DIAGNOSIS — R262 Difficulty in walking, not elsewhere classified: Secondary | ICD-10-CM | POA: Diagnosis not present

## 2020-05-06 DIAGNOSIS — Z741 Need for assistance with personal care: Secondary | ICD-10-CM | POA: Diagnosis not present

## 2020-05-06 DIAGNOSIS — I10 Essential (primary) hypertension: Secondary | ICD-10-CM | POA: Diagnosis not present

## 2020-05-06 DIAGNOSIS — S72145D Nondisplaced intertrochanteric fracture of left femur, subsequent encounter for closed fracture with routine healing: Secondary | ICD-10-CM | POA: Diagnosis not present

## 2020-05-06 DIAGNOSIS — R338 Other retention of urine: Secondary | ICD-10-CM | POA: Diagnosis not present

## 2020-05-12 DIAGNOSIS — R278 Other lack of coordination: Secondary | ICD-10-CM | POA: Diagnosis not present

## 2020-05-12 DIAGNOSIS — R2681 Unsteadiness on feet: Secondary | ICD-10-CM | POA: Diagnosis not present

## 2020-05-12 DIAGNOSIS — R338 Other retention of urine: Secondary | ICD-10-CM | POA: Diagnosis not present

## 2020-05-12 DIAGNOSIS — Z741 Need for assistance with personal care: Secondary | ICD-10-CM | POA: Diagnosis not present

## 2020-05-12 DIAGNOSIS — I10 Essential (primary) hypertension: Secondary | ICD-10-CM | POA: Diagnosis not present

## 2020-05-12 DIAGNOSIS — S72145D Nondisplaced intertrochanteric fracture of left femur, subsequent encounter for closed fracture with routine healing: Secondary | ICD-10-CM | POA: Diagnosis not present

## 2020-05-12 DIAGNOSIS — R262 Difficulty in walking, not elsewhere classified: Secondary | ICD-10-CM | POA: Diagnosis not present

## 2020-05-16 DIAGNOSIS — I10 Essential (primary) hypertension: Secondary | ICD-10-CM | POA: Diagnosis not present

## 2020-05-16 DIAGNOSIS — Z741 Need for assistance with personal care: Secondary | ICD-10-CM | POA: Diagnosis not present

## 2020-05-16 DIAGNOSIS — S72145D Nondisplaced intertrochanteric fracture of left femur, subsequent encounter for closed fracture with routine healing: Secondary | ICD-10-CM | POA: Diagnosis not present

## 2020-05-16 DIAGNOSIS — R262 Difficulty in walking, not elsewhere classified: Secondary | ICD-10-CM | POA: Diagnosis not present

## 2020-05-16 DIAGNOSIS — R2681 Unsteadiness on feet: Secondary | ICD-10-CM | POA: Diagnosis not present

## 2020-05-16 DIAGNOSIS — R278 Other lack of coordination: Secondary | ICD-10-CM | POA: Diagnosis not present

## 2020-05-16 DIAGNOSIS — R338 Other retention of urine: Secondary | ICD-10-CM | POA: Diagnosis not present

## 2020-05-17 DIAGNOSIS — Z741 Need for assistance with personal care: Secondary | ICD-10-CM | POA: Diagnosis not present

## 2020-05-17 DIAGNOSIS — R278 Other lack of coordination: Secondary | ICD-10-CM | POA: Diagnosis not present

## 2020-05-17 DIAGNOSIS — I10 Essential (primary) hypertension: Secondary | ICD-10-CM | POA: Diagnosis not present

## 2020-05-17 DIAGNOSIS — R262 Difficulty in walking, not elsewhere classified: Secondary | ICD-10-CM | POA: Diagnosis not present

## 2020-05-17 DIAGNOSIS — S72145D Nondisplaced intertrochanteric fracture of left femur, subsequent encounter for closed fracture with routine healing: Secondary | ICD-10-CM | POA: Diagnosis not present

## 2020-05-17 DIAGNOSIS — R338 Other retention of urine: Secondary | ICD-10-CM | POA: Diagnosis not present

## 2020-05-17 DIAGNOSIS — R2681 Unsteadiness on feet: Secondary | ICD-10-CM | POA: Diagnosis not present

## 2020-05-18 DIAGNOSIS — R278 Other lack of coordination: Secondary | ICD-10-CM | POA: Diagnosis not present

## 2020-05-18 DIAGNOSIS — S72145D Nondisplaced intertrochanteric fracture of left femur, subsequent encounter for closed fracture with routine healing: Secondary | ICD-10-CM | POA: Diagnosis not present

## 2020-05-18 DIAGNOSIS — R262 Difficulty in walking, not elsewhere classified: Secondary | ICD-10-CM | POA: Diagnosis not present

## 2020-05-18 DIAGNOSIS — I10 Essential (primary) hypertension: Secondary | ICD-10-CM | POA: Diagnosis not present

## 2020-05-18 DIAGNOSIS — R2681 Unsteadiness on feet: Secondary | ICD-10-CM | POA: Diagnosis not present

## 2020-05-18 DIAGNOSIS — Z741 Need for assistance with personal care: Secondary | ICD-10-CM | POA: Diagnosis not present

## 2020-05-18 DIAGNOSIS — R338 Other retention of urine: Secondary | ICD-10-CM | POA: Diagnosis not present

## 2020-05-20 DIAGNOSIS — I10 Essential (primary) hypertension: Secondary | ICD-10-CM | POA: Diagnosis not present

## 2020-05-20 DIAGNOSIS — S72145D Nondisplaced intertrochanteric fracture of left femur, subsequent encounter for closed fracture with routine healing: Secondary | ICD-10-CM | POA: Diagnosis not present

## 2020-05-20 DIAGNOSIS — R338 Other retention of urine: Secondary | ICD-10-CM | POA: Diagnosis not present

## 2020-05-20 DIAGNOSIS — R262 Difficulty in walking, not elsewhere classified: Secondary | ICD-10-CM | POA: Diagnosis not present

## 2020-05-20 DIAGNOSIS — Z741 Need for assistance with personal care: Secondary | ICD-10-CM | POA: Diagnosis not present

## 2020-05-20 DIAGNOSIS — R2681 Unsteadiness on feet: Secondary | ICD-10-CM | POA: Diagnosis not present

## 2020-05-20 DIAGNOSIS — R278 Other lack of coordination: Secondary | ICD-10-CM | POA: Diagnosis not present

## 2020-05-23 DIAGNOSIS — R2681 Unsteadiness on feet: Secondary | ICD-10-CM | POA: Diagnosis not present

## 2020-05-23 DIAGNOSIS — R338 Other retention of urine: Secondary | ICD-10-CM | POA: Diagnosis not present

## 2020-05-23 DIAGNOSIS — R278 Other lack of coordination: Secondary | ICD-10-CM | POA: Diagnosis not present

## 2020-05-23 DIAGNOSIS — Z741 Need for assistance with personal care: Secondary | ICD-10-CM | POA: Diagnosis not present

## 2020-05-23 DIAGNOSIS — R262 Difficulty in walking, not elsewhere classified: Secondary | ICD-10-CM | POA: Diagnosis not present

## 2020-05-23 DIAGNOSIS — I10 Essential (primary) hypertension: Secondary | ICD-10-CM | POA: Diagnosis not present

## 2020-05-23 DIAGNOSIS — S72145D Nondisplaced intertrochanteric fracture of left femur, subsequent encounter for closed fracture with routine healing: Secondary | ICD-10-CM | POA: Diagnosis not present

## 2020-05-24 DIAGNOSIS — I10 Essential (primary) hypertension: Secondary | ICD-10-CM | POA: Diagnosis not present

## 2020-05-24 DIAGNOSIS — Z741 Need for assistance with personal care: Secondary | ICD-10-CM | POA: Diagnosis not present

## 2020-05-24 DIAGNOSIS — S72145D Nondisplaced intertrochanteric fracture of left femur, subsequent encounter for closed fracture with routine healing: Secondary | ICD-10-CM | POA: Diagnosis not present

## 2020-05-24 DIAGNOSIS — R262 Difficulty in walking, not elsewhere classified: Secondary | ICD-10-CM | POA: Diagnosis not present

## 2020-05-24 DIAGNOSIS — R278 Other lack of coordination: Secondary | ICD-10-CM | POA: Diagnosis not present

## 2020-05-24 DIAGNOSIS — R2681 Unsteadiness on feet: Secondary | ICD-10-CM | POA: Diagnosis not present

## 2020-05-24 DIAGNOSIS — R338 Other retention of urine: Secondary | ICD-10-CM | POA: Diagnosis not present

## 2020-05-25 DIAGNOSIS — S72145D Nondisplaced intertrochanteric fracture of left femur, subsequent encounter for closed fracture with routine healing: Secondary | ICD-10-CM | POA: Diagnosis not present

## 2020-05-25 DIAGNOSIS — Z741 Need for assistance with personal care: Secondary | ICD-10-CM | POA: Diagnosis not present

## 2020-05-25 DIAGNOSIS — R278 Other lack of coordination: Secondary | ICD-10-CM | POA: Diagnosis not present

## 2020-05-25 DIAGNOSIS — I10 Essential (primary) hypertension: Secondary | ICD-10-CM | POA: Diagnosis not present

## 2020-05-25 DIAGNOSIS — R338 Other retention of urine: Secondary | ICD-10-CM | POA: Diagnosis not present

## 2020-05-25 DIAGNOSIS — R2681 Unsteadiness on feet: Secondary | ICD-10-CM | POA: Diagnosis not present

## 2020-05-25 DIAGNOSIS — R262 Difficulty in walking, not elsewhere classified: Secondary | ICD-10-CM | POA: Diagnosis not present

## 2020-05-30 DIAGNOSIS — I10 Essential (primary) hypertension: Secondary | ICD-10-CM | POA: Diagnosis not present

## 2020-05-30 DIAGNOSIS — Z741 Need for assistance with personal care: Secondary | ICD-10-CM | POA: Diagnosis not present

## 2020-05-30 DIAGNOSIS — R262 Difficulty in walking, not elsewhere classified: Secondary | ICD-10-CM | POA: Diagnosis not present

## 2020-05-30 DIAGNOSIS — R338 Other retention of urine: Secondary | ICD-10-CM | POA: Diagnosis not present

## 2020-05-30 DIAGNOSIS — R2681 Unsteadiness on feet: Secondary | ICD-10-CM | POA: Diagnosis not present

## 2020-05-30 DIAGNOSIS — S72145D Nondisplaced intertrochanteric fracture of left femur, subsequent encounter for closed fracture with routine healing: Secondary | ICD-10-CM | POA: Diagnosis not present

## 2020-05-30 DIAGNOSIS — R278 Other lack of coordination: Secondary | ICD-10-CM | POA: Diagnosis not present

## 2020-05-31 DIAGNOSIS — Z741 Need for assistance with personal care: Secondary | ICD-10-CM | POA: Diagnosis not present

## 2020-05-31 DIAGNOSIS — S72145D Nondisplaced intertrochanteric fracture of left femur, subsequent encounter for closed fracture with routine healing: Secondary | ICD-10-CM | POA: Diagnosis not present

## 2020-05-31 DIAGNOSIS — R338 Other retention of urine: Secondary | ICD-10-CM | POA: Diagnosis not present

## 2020-05-31 DIAGNOSIS — R278 Other lack of coordination: Secondary | ICD-10-CM | POA: Diagnosis not present

## 2020-05-31 DIAGNOSIS — I10 Essential (primary) hypertension: Secondary | ICD-10-CM | POA: Diagnosis not present

## 2020-05-31 DIAGNOSIS — R262 Difficulty in walking, not elsewhere classified: Secondary | ICD-10-CM | POA: Diagnosis not present

## 2020-05-31 DIAGNOSIS — R2681 Unsteadiness on feet: Secondary | ICD-10-CM | POA: Diagnosis not present

## 2020-06-02 DIAGNOSIS — R338 Other retention of urine: Secondary | ICD-10-CM | POA: Diagnosis not present

## 2020-06-02 DIAGNOSIS — R2681 Unsteadiness on feet: Secondary | ICD-10-CM | POA: Diagnosis not present

## 2020-06-02 DIAGNOSIS — I10 Essential (primary) hypertension: Secondary | ICD-10-CM | POA: Diagnosis not present

## 2020-06-02 DIAGNOSIS — R262 Difficulty in walking, not elsewhere classified: Secondary | ICD-10-CM | POA: Diagnosis not present

## 2020-06-02 DIAGNOSIS — R278 Other lack of coordination: Secondary | ICD-10-CM | POA: Diagnosis not present

## 2020-06-02 DIAGNOSIS — Z741 Need for assistance with personal care: Secondary | ICD-10-CM | POA: Diagnosis not present

## 2020-06-02 DIAGNOSIS — S72145D Nondisplaced intertrochanteric fracture of left femur, subsequent encounter for closed fracture with routine healing: Secondary | ICD-10-CM | POA: Diagnosis not present

## 2020-06-03 DIAGNOSIS — R278 Other lack of coordination: Secondary | ICD-10-CM | POA: Diagnosis not present

## 2020-06-03 DIAGNOSIS — R262 Difficulty in walking, not elsewhere classified: Secondary | ICD-10-CM | POA: Diagnosis not present

## 2020-06-03 DIAGNOSIS — S72145D Nondisplaced intertrochanteric fracture of left femur, subsequent encounter for closed fracture with routine healing: Secondary | ICD-10-CM | POA: Diagnosis not present

## 2020-06-03 DIAGNOSIS — R338 Other retention of urine: Secondary | ICD-10-CM | POA: Diagnosis not present

## 2020-06-03 DIAGNOSIS — Z741 Need for assistance with personal care: Secondary | ICD-10-CM | POA: Diagnosis not present

## 2020-06-03 DIAGNOSIS — R2681 Unsteadiness on feet: Secondary | ICD-10-CM | POA: Diagnosis not present

## 2020-06-03 DIAGNOSIS — I10 Essential (primary) hypertension: Secondary | ICD-10-CM | POA: Diagnosis not present

## 2020-06-06 DIAGNOSIS — S72145D Nondisplaced intertrochanteric fracture of left femur, subsequent encounter for closed fracture with routine healing: Secondary | ICD-10-CM | POA: Diagnosis not present

## 2020-06-06 DIAGNOSIS — R2681 Unsteadiness on feet: Secondary | ICD-10-CM | POA: Diagnosis not present

## 2020-06-06 DIAGNOSIS — Z741 Need for assistance with personal care: Secondary | ICD-10-CM | POA: Diagnosis not present

## 2020-06-06 DIAGNOSIS — R338 Other retention of urine: Secondary | ICD-10-CM | POA: Diagnosis not present

## 2020-06-06 DIAGNOSIS — R262 Difficulty in walking, not elsewhere classified: Secondary | ICD-10-CM | POA: Diagnosis not present

## 2020-06-06 DIAGNOSIS — R278 Other lack of coordination: Secondary | ICD-10-CM | POA: Diagnosis not present

## 2020-06-06 DIAGNOSIS — I10 Essential (primary) hypertension: Secondary | ICD-10-CM | POA: Diagnosis not present

## 2020-06-07 DIAGNOSIS — R338 Other retention of urine: Secondary | ICD-10-CM | POA: Diagnosis not present

## 2020-06-07 DIAGNOSIS — R278 Other lack of coordination: Secondary | ICD-10-CM | POA: Diagnosis not present

## 2020-06-07 DIAGNOSIS — S72145D Nondisplaced intertrochanteric fracture of left femur, subsequent encounter for closed fracture with routine healing: Secondary | ICD-10-CM | POA: Diagnosis not present

## 2020-06-07 DIAGNOSIS — R2681 Unsteadiness on feet: Secondary | ICD-10-CM | POA: Diagnosis not present

## 2020-06-07 DIAGNOSIS — Z741 Need for assistance with personal care: Secondary | ICD-10-CM | POA: Diagnosis not present

## 2020-06-07 DIAGNOSIS — R262 Difficulty in walking, not elsewhere classified: Secondary | ICD-10-CM | POA: Diagnosis not present

## 2020-06-07 DIAGNOSIS — I10 Essential (primary) hypertension: Secondary | ICD-10-CM | POA: Diagnosis not present

## 2020-06-09 DIAGNOSIS — R278 Other lack of coordination: Secondary | ICD-10-CM | POA: Diagnosis not present

## 2020-06-09 DIAGNOSIS — I10 Essential (primary) hypertension: Secondary | ICD-10-CM | POA: Diagnosis not present

## 2020-06-09 DIAGNOSIS — S72145D Nondisplaced intertrochanteric fracture of left femur, subsequent encounter for closed fracture with routine healing: Secondary | ICD-10-CM | POA: Diagnosis not present

## 2020-06-09 DIAGNOSIS — R2681 Unsteadiness on feet: Secondary | ICD-10-CM | POA: Diagnosis not present

## 2020-06-09 DIAGNOSIS — R338 Other retention of urine: Secondary | ICD-10-CM | POA: Diagnosis not present

## 2020-06-09 DIAGNOSIS — Z741 Need for assistance with personal care: Secondary | ICD-10-CM | POA: Diagnosis not present

## 2020-06-09 DIAGNOSIS — R262 Difficulty in walking, not elsewhere classified: Secondary | ICD-10-CM | POA: Diagnosis not present

## 2020-06-13 DIAGNOSIS — I350 Nonrheumatic aortic (valve) stenosis: Secondary | ICD-10-CM | POA: Diagnosis not present

## 2020-06-13 DIAGNOSIS — I10 Essential (primary) hypertension: Secondary | ICD-10-CM | POA: Diagnosis not present

## 2020-06-13 DIAGNOSIS — S72145D Nondisplaced intertrochanteric fracture of left femur, subsequent encounter for closed fracture with routine healing: Secondary | ICD-10-CM | POA: Diagnosis not present

## 2020-06-13 DIAGNOSIS — R278 Other lack of coordination: Secondary | ICD-10-CM | POA: Diagnosis not present

## 2020-06-13 DIAGNOSIS — R2681 Unsteadiness on feet: Secondary | ICD-10-CM | POA: Diagnosis not present

## 2020-06-13 DIAGNOSIS — Z741 Need for assistance with personal care: Secondary | ICD-10-CM | POA: Diagnosis not present

## 2020-06-13 DIAGNOSIS — F0150 Vascular dementia without behavioral disturbance: Secondary | ICD-10-CM | POA: Diagnosis not present

## 2020-06-13 DIAGNOSIS — F39 Unspecified mood [affective] disorder: Secondary | ICD-10-CM | POA: Diagnosis not present

## 2020-06-13 DIAGNOSIS — R262 Difficulty in walking, not elsewhere classified: Secondary | ICD-10-CM | POA: Diagnosis not present

## 2020-06-13 DIAGNOSIS — R338 Other retention of urine: Secondary | ICD-10-CM | POA: Diagnosis not present

## 2020-06-14 DIAGNOSIS — S72145D Nondisplaced intertrochanteric fracture of left femur, subsequent encounter for closed fracture with routine healing: Secondary | ICD-10-CM | POA: Diagnosis not present

## 2020-06-14 DIAGNOSIS — Z741 Need for assistance with personal care: Secondary | ICD-10-CM | POA: Diagnosis not present

## 2020-06-14 DIAGNOSIS — R278 Other lack of coordination: Secondary | ICD-10-CM | POA: Diagnosis not present

## 2020-06-14 DIAGNOSIS — I10 Essential (primary) hypertension: Secondary | ICD-10-CM | POA: Diagnosis not present

## 2020-06-14 DIAGNOSIS — R338 Other retention of urine: Secondary | ICD-10-CM | POA: Diagnosis not present

## 2020-06-14 DIAGNOSIS — R2681 Unsteadiness on feet: Secondary | ICD-10-CM | POA: Diagnosis not present

## 2020-06-14 DIAGNOSIS — R262 Difficulty in walking, not elsewhere classified: Secondary | ICD-10-CM | POA: Diagnosis not present

## 2020-06-16 DIAGNOSIS — R2681 Unsteadiness on feet: Secondary | ICD-10-CM | POA: Diagnosis not present

## 2020-06-16 DIAGNOSIS — R262 Difficulty in walking, not elsewhere classified: Secondary | ICD-10-CM | POA: Diagnosis not present

## 2020-06-16 DIAGNOSIS — R338 Other retention of urine: Secondary | ICD-10-CM | POA: Diagnosis not present

## 2020-06-16 DIAGNOSIS — S72145D Nondisplaced intertrochanteric fracture of left femur, subsequent encounter for closed fracture with routine healing: Secondary | ICD-10-CM | POA: Diagnosis not present

## 2020-06-16 DIAGNOSIS — Z741 Need for assistance with personal care: Secondary | ICD-10-CM | POA: Diagnosis not present

## 2020-06-16 DIAGNOSIS — R278 Other lack of coordination: Secondary | ICD-10-CM | POA: Diagnosis not present

## 2020-06-16 DIAGNOSIS — I10 Essential (primary) hypertension: Secondary | ICD-10-CM | POA: Diagnosis not present

## 2020-06-17 DIAGNOSIS — R278 Other lack of coordination: Secondary | ICD-10-CM | POA: Diagnosis not present

## 2020-06-17 DIAGNOSIS — S72145D Nondisplaced intertrochanteric fracture of left femur, subsequent encounter for closed fracture with routine healing: Secondary | ICD-10-CM | POA: Diagnosis not present

## 2020-06-17 DIAGNOSIS — R2681 Unsteadiness on feet: Secondary | ICD-10-CM | POA: Diagnosis not present

## 2020-06-17 DIAGNOSIS — Z741 Need for assistance with personal care: Secondary | ICD-10-CM | POA: Diagnosis not present

## 2020-06-17 DIAGNOSIS — I10 Essential (primary) hypertension: Secondary | ICD-10-CM | POA: Diagnosis not present

## 2020-06-17 DIAGNOSIS — R338 Other retention of urine: Secondary | ICD-10-CM | POA: Diagnosis not present

## 2020-06-17 DIAGNOSIS — R262 Difficulty in walking, not elsewhere classified: Secondary | ICD-10-CM | POA: Diagnosis not present

## 2020-06-21 DIAGNOSIS — R2681 Unsteadiness on feet: Secondary | ICD-10-CM | POA: Diagnosis not present

## 2020-06-21 DIAGNOSIS — R338 Other retention of urine: Secondary | ICD-10-CM | POA: Diagnosis not present

## 2020-06-21 DIAGNOSIS — S72145D Nondisplaced intertrochanteric fracture of left femur, subsequent encounter for closed fracture with routine healing: Secondary | ICD-10-CM | POA: Diagnosis not present

## 2020-06-21 DIAGNOSIS — R262 Difficulty in walking, not elsewhere classified: Secondary | ICD-10-CM | POA: Diagnosis not present

## 2020-06-21 DIAGNOSIS — I10 Essential (primary) hypertension: Secondary | ICD-10-CM | POA: Diagnosis not present

## 2020-06-21 DIAGNOSIS — Z741 Need for assistance with personal care: Secondary | ICD-10-CM | POA: Diagnosis not present

## 2020-06-21 DIAGNOSIS — R278 Other lack of coordination: Secondary | ICD-10-CM | POA: Diagnosis not present

## 2020-06-23 DIAGNOSIS — Z741 Need for assistance with personal care: Secondary | ICD-10-CM | POA: Diagnosis not present

## 2020-06-23 DIAGNOSIS — R338 Other retention of urine: Secondary | ICD-10-CM | POA: Diagnosis not present

## 2020-06-23 DIAGNOSIS — R2681 Unsteadiness on feet: Secondary | ICD-10-CM | POA: Diagnosis not present

## 2020-06-23 DIAGNOSIS — R278 Other lack of coordination: Secondary | ICD-10-CM | POA: Diagnosis not present

## 2020-06-23 DIAGNOSIS — R262 Difficulty in walking, not elsewhere classified: Secondary | ICD-10-CM | POA: Diagnosis not present

## 2020-06-23 DIAGNOSIS — S72145D Nondisplaced intertrochanteric fracture of left femur, subsequent encounter for closed fracture with routine healing: Secondary | ICD-10-CM | POA: Diagnosis not present

## 2020-06-23 DIAGNOSIS — I10 Essential (primary) hypertension: Secondary | ICD-10-CM | POA: Diagnosis not present

## 2020-06-27 DIAGNOSIS — R2681 Unsteadiness on feet: Secondary | ICD-10-CM | POA: Diagnosis not present

## 2020-06-27 DIAGNOSIS — Z741 Need for assistance with personal care: Secondary | ICD-10-CM | POA: Diagnosis not present

## 2020-06-27 DIAGNOSIS — R262 Difficulty in walking, not elsewhere classified: Secondary | ICD-10-CM | POA: Diagnosis not present

## 2020-06-27 DIAGNOSIS — R278 Other lack of coordination: Secondary | ICD-10-CM | POA: Diagnosis not present

## 2020-06-27 DIAGNOSIS — S72145D Nondisplaced intertrochanteric fracture of left femur, subsequent encounter for closed fracture with routine healing: Secondary | ICD-10-CM | POA: Diagnosis not present

## 2020-06-27 DIAGNOSIS — I10 Essential (primary) hypertension: Secondary | ICD-10-CM | POA: Diagnosis not present

## 2020-06-27 DIAGNOSIS — R338 Other retention of urine: Secondary | ICD-10-CM | POA: Diagnosis not present

## 2020-06-28 DIAGNOSIS — R278 Other lack of coordination: Secondary | ICD-10-CM | POA: Diagnosis not present

## 2020-06-28 DIAGNOSIS — R2681 Unsteadiness on feet: Secondary | ICD-10-CM | POA: Diagnosis not present

## 2020-06-28 DIAGNOSIS — R338 Other retention of urine: Secondary | ICD-10-CM | POA: Diagnosis not present

## 2020-06-28 DIAGNOSIS — Z741 Need for assistance with personal care: Secondary | ICD-10-CM | POA: Diagnosis not present

## 2020-06-28 DIAGNOSIS — S72145D Nondisplaced intertrochanteric fracture of left femur, subsequent encounter for closed fracture with routine healing: Secondary | ICD-10-CM | POA: Diagnosis not present

## 2020-06-28 DIAGNOSIS — I10 Essential (primary) hypertension: Secondary | ICD-10-CM | POA: Diagnosis not present

## 2020-06-28 DIAGNOSIS — R262 Difficulty in walking, not elsewhere classified: Secondary | ICD-10-CM | POA: Diagnosis not present

## 2020-06-30 DIAGNOSIS — Z741 Need for assistance with personal care: Secondary | ICD-10-CM | POA: Diagnosis not present

## 2020-06-30 DIAGNOSIS — R262 Difficulty in walking, not elsewhere classified: Secondary | ICD-10-CM | POA: Diagnosis not present

## 2020-06-30 DIAGNOSIS — R278 Other lack of coordination: Secondary | ICD-10-CM | POA: Diagnosis not present

## 2020-06-30 DIAGNOSIS — S72145D Nondisplaced intertrochanteric fracture of left femur, subsequent encounter for closed fracture with routine healing: Secondary | ICD-10-CM | POA: Diagnosis not present

## 2020-06-30 DIAGNOSIS — R2681 Unsteadiness on feet: Secondary | ICD-10-CM | POA: Diagnosis not present

## 2020-06-30 DIAGNOSIS — R338 Other retention of urine: Secondary | ICD-10-CM | POA: Diagnosis not present

## 2020-06-30 DIAGNOSIS — I10 Essential (primary) hypertension: Secondary | ICD-10-CM | POA: Diagnosis not present

## 2020-07-04 DIAGNOSIS — S72145D Nondisplaced intertrochanteric fracture of left femur, subsequent encounter for closed fracture with routine healing: Secondary | ICD-10-CM | POA: Diagnosis not present

## 2020-07-04 DIAGNOSIS — R338 Other retention of urine: Secondary | ICD-10-CM | POA: Diagnosis not present

## 2020-07-04 DIAGNOSIS — Z741 Need for assistance with personal care: Secondary | ICD-10-CM | POA: Diagnosis not present

## 2020-07-04 DIAGNOSIS — R262 Difficulty in walking, not elsewhere classified: Secondary | ICD-10-CM | POA: Diagnosis not present

## 2020-07-04 DIAGNOSIS — I10 Essential (primary) hypertension: Secondary | ICD-10-CM | POA: Diagnosis not present

## 2020-07-04 DIAGNOSIS — R278 Other lack of coordination: Secondary | ICD-10-CM | POA: Diagnosis not present

## 2020-07-04 DIAGNOSIS — R2681 Unsteadiness on feet: Secondary | ICD-10-CM | POA: Diagnosis not present

## 2020-07-05 DIAGNOSIS — R2681 Unsteadiness on feet: Secondary | ICD-10-CM | POA: Diagnosis not present

## 2020-07-05 DIAGNOSIS — S72145D Nondisplaced intertrochanteric fracture of left femur, subsequent encounter for closed fracture with routine healing: Secondary | ICD-10-CM | POA: Diagnosis not present

## 2020-07-05 DIAGNOSIS — I10 Essential (primary) hypertension: Secondary | ICD-10-CM | POA: Diagnosis not present

## 2020-07-05 DIAGNOSIS — R278 Other lack of coordination: Secondary | ICD-10-CM | POA: Diagnosis not present

## 2020-07-05 DIAGNOSIS — R262 Difficulty in walking, not elsewhere classified: Secondary | ICD-10-CM | POA: Diagnosis not present

## 2020-07-05 DIAGNOSIS — Z741 Need for assistance with personal care: Secondary | ICD-10-CM | POA: Diagnosis not present

## 2020-07-05 DIAGNOSIS — R338 Other retention of urine: Secondary | ICD-10-CM | POA: Diagnosis not present

## 2020-07-06 DIAGNOSIS — R202 Paresthesia of skin: Secondary | ICD-10-CM | POA: Diagnosis not present

## 2020-07-07 DIAGNOSIS — I10 Essential (primary) hypertension: Secondary | ICD-10-CM | POA: Diagnosis not present

## 2020-07-07 DIAGNOSIS — R2681 Unsteadiness on feet: Secondary | ICD-10-CM | POA: Diagnosis not present

## 2020-07-07 DIAGNOSIS — S72145D Nondisplaced intertrochanteric fracture of left femur, subsequent encounter for closed fracture with routine healing: Secondary | ICD-10-CM | POA: Diagnosis not present

## 2020-07-07 DIAGNOSIS — R278 Other lack of coordination: Secondary | ICD-10-CM | POA: Diagnosis not present

## 2020-07-07 DIAGNOSIS — Z741 Need for assistance with personal care: Secondary | ICD-10-CM | POA: Diagnosis not present

## 2020-07-07 DIAGNOSIS — R262 Difficulty in walking, not elsewhere classified: Secondary | ICD-10-CM | POA: Diagnosis not present

## 2020-07-07 DIAGNOSIS — R338 Other retention of urine: Secondary | ICD-10-CM | POA: Diagnosis not present

## 2020-07-11 DIAGNOSIS — Z741 Need for assistance with personal care: Secondary | ICD-10-CM | POA: Diagnosis not present

## 2020-07-11 DIAGNOSIS — I10 Essential (primary) hypertension: Secondary | ICD-10-CM | POA: Diagnosis not present

## 2020-07-11 DIAGNOSIS — R278 Other lack of coordination: Secondary | ICD-10-CM | POA: Diagnosis not present

## 2020-07-11 DIAGNOSIS — R262 Difficulty in walking, not elsewhere classified: Secondary | ICD-10-CM | POA: Diagnosis not present

## 2020-07-11 DIAGNOSIS — R338 Other retention of urine: Secondary | ICD-10-CM | POA: Diagnosis not present

## 2020-07-11 DIAGNOSIS — R2681 Unsteadiness on feet: Secondary | ICD-10-CM | POA: Diagnosis not present

## 2020-07-11 DIAGNOSIS — S72145D Nondisplaced intertrochanteric fracture of left femur, subsequent encounter for closed fracture with routine healing: Secondary | ICD-10-CM | POA: Diagnosis not present

## 2020-07-12 DIAGNOSIS — R2681 Unsteadiness on feet: Secondary | ICD-10-CM | POA: Diagnosis not present

## 2020-07-12 DIAGNOSIS — R262 Difficulty in walking, not elsewhere classified: Secondary | ICD-10-CM | POA: Diagnosis not present

## 2020-07-12 DIAGNOSIS — Z741 Need for assistance with personal care: Secondary | ICD-10-CM | POA: Diagnosis not present

## 2020-07-12 DIAGNOSIS — S72145D Nondisplaced intertrochanteric fracture of left femur, subsequent encounter for closed fracture with routine healing: Secondary | ICD-10-CM | POA: Diagnosis not present

## 2020-07-12 DIAGNOSIS — R278 Other lack of coordination: Secondary | ICD-10-CM | POA: Diagnosis not present

## 2020-07-12 DIAGNOSIS — I10 Essential (primary) hypertension: Secondary | ICD-10-CM | POA: Diagnosis not present

## 2020-07-12 DIAGNOSIS — R338 Other retention of urine: Secondary | ICD-10-CM | POA: Diagnosis not present

## 2020-07-14 DIAGNOSIS — R2681 Unsteadiness on feet: Secondary | ICD-10-CM | POA: Diagnosis not present

## 2020-07-14 DIAGNOSIS — R338 Other retention of urine: Secondary | ICD-10-CM | POA: Diagnosis not present

## 2020-07-14 DIAGNOSIS — R262 Difficulty in walking, not elsewhere classified: Secondary | ICD-10-CM | POA: Diagnosis not present

## 2020-07-14 DIAGNOSIS — I10 Essential (primary) hypertension: Secondary | ICD-10-CM | POA: Diagnosis not present

## 2020-07-14 DIAGNOSIS — R278 Other lack of coordination: Secondary | ICD-10-CM | POA: Diagnosis not present

## 2020-07-14 DIAGNOSIS — Z741 Need for assistance with personal care: Secondary | ICD-10-CM | POA: Diagnosis not present

## 2020-07-14 DIAGNOSIS — S72145D Nondisplaced intertrochanteric fracture of left femur, subsequent encounter for closed fracture with routine healing: Secondary | ICD-10-CM | POA: Diagnosis not present

## 2020-07-18 DIAGNOSIS — S72145D Nondisplaced intertrochanteric fracture of left femur, subsequent encounter for closed fracture with routine healing: Secondary | ICD-10-CM | POA: Diagnosis not present

## 2020-07-18 DIAGNOSIS — R2681 Unsteadiness on feet: Secondary | ICD-10-CM | POA: Diagnosis not present

## 2020-07-18 DIAGNOSIS — R278 Other lack of coordination: Secondary | ICD-10-CM | POA: Diagnosis not present

## 2020-07-18 DIAGNOSIS — R262 Difficulty in walking, not elsewhere classified: Secondary | ICD-10-CM | POA: Diagnosis not present

## 2020-07-18 DIAGNOSIS — Z741 Need for assistance with personal care: Secondary | ICD-10-CM | POA: Diagnosis not present

## 2020-07-18 DIAGNOSIS — I10 Essential (primary) hypertension: Secondary | ICD-10-CM | POA: Diagnosis not present

## 2020-07-18 DIAGNOSIS — R338 Other retention of urine: Secondary | ICD-10-CM | POA: Diagnosis not present

## 2020-07-19 DIAGNOSIS — I10 Essential (primary) hypertension: Secondary | ICD-10-CM | POA: Diagnosis not present

## 2020-07-19 DIAGNOSIS — S72145D Nondisplaced intertrochanteric fracture of left femur, subsequent encounter for closed fracture with routine healing: Secondary | ICD-10-CM | POA: Diagnosis not present

## 2020-07-19 DIAGNOSIS — R262 Difficulty in walking, not elsewhere classified: Secondary | ICD-10-CM | POA: Diagnosis not present

## 2020-07-19 DIAGNOSIS — R278 Other lack of coordination: Secondary | ICD-10-CM | POA: Diagnosis not present

## 2020-07-19 DIAGNOSIS — Z741 Need for assistance with personal care: Secondary | ICD-10-CM | POA: Diagnosis not present

## 2020-07-19 DIAGNOSIS — R338 Other retention of urine: Secondary | ICD-10-CM | POA: Diagnosis not present

## 2020-07-19 DIAGNOSIS — R2681 Unsteadiness on feet: Secondary | ICD-10-CM | POA: Diagnosis not present

## 2020-07-21 DIAGNOSIS — I10 Essential (primary) hypertension: Secondary | ICD-10-CM | POA: Diagnosis not present

## 2020-07-21 DIAGNOSIS — Z741 Need for assistance with personal care: Secondary | ICD-10-CM | POA: Diagnosis not present

## 2020-07-21 DIAGNOSIS — S72145D Nondisplaced intertrochanteric fracture of left femur, subsequent encounter for closed fracture with routine healing: Secondary | ICD-10-CM | POA: Diagnosis not present

## 2020-07-21 DIAGNOSIS — R262 Difficulty in walking, not elsewhere classified: Secondary | ICD-10-CM | POA: Diagnosis not present

## 2020-07-21 DIAGNOSIS — R338 Other retention of urine: Secondary | ICD-10-CM | POA: Diagnosis not present

## 2020-07-21 DIAGNOSIS — R278 Other lack of coordination: Secondary | ICD-10-CM | POA: Diagnosis not present

## 2020-07-21 DIAGNOSIS — R2681 Unsteadiness on feet: Secondary | ICD-10-CM | POA: Diagnosis not present

## 2020-07-26 DIAGNOSIS — R338 Other retention of urine: Secondary | ICD-10-CM | POA: Diagnosis not present

## 2020-07-26 DIAGNOSIS — R278 Other lack of coordination: Secondary | ICD-10-CM | POA: Diagnosis not present

## 2020-07-26 DIAGNOSIS — S72145D Nondisplaced intertrochanteric fracture of left femur, subsequent encounter for closed fracture with routine healing: Secondary | ICD-10-CM | POA: Diagnosis not present

## 2020-07-26 DIAGNOSIS — R262 Difficulty in walking, not elsewhere classified: Secondary | ICD-10-CM | POA: Diagnosis not present

## 2020-07-26 DIAGNOSIS — Z741 Need for assistance with personal care: Secondary | ICD-10-CM | POA: Diagnosis not present

## 2020-07-26 DIAGNOSIS — I10 Essential (primary) hypertension: Secondary | ICD-10-CM | POA: Diagnosis not present

## 2020-07-26 DIAGNOSIS — R2681 Unsteadiness on feet: Secondary | ICD-10-CM | POA: Diagnosis not present

## 2020-07-28 DIAGNOSIS — R2681 Unsteadiness on feet: Secondary | ICD-10-CM | POA: Diagnosis not present

## 2020-07-28 DIAGNOSIS — R338 Other retention of urine: Secondary | ICD-10-CM | POA: Diagnosis not present

## 2020-07-28 DIAGNOSIS — I10 Essential (primary) hypertension: Secondary | ICD-10-CM | POA: Diagnosis not present

## 2020-07-28 DIAGNOSIS — Z741 Need for assistance with personal care: Secondary | ICD-10-CM | POA: Diagnosis not present

## 2020-07-28 DIAGNOSIS — R262 Difficulty in walking, not elsewhere classified: Secondary | ICD-10-CM | POA: Diagnosis not present

## 2020-07-28 DIAGNOSIS — R278 Other lack of coordination: Secondary | ICD-10-CM | POA: Diagnosis not present

## 2020-07-28 DIAGNOSIS — S72145D Nondisplaced intertrochanteric fracture of left femur, subsequent encounter for closed fracture with routine healing: Secondary | ICD-10-CM | POA: Diagnosis not present

## 2020-08-01 DIAGNOSIS — R278 Other lack of coordination: Secondary | ICD-10-CM | POA: Diagnosis not present

## 2020-08-01 DIAGNOSIS — I10 Essential (primary) hypertension: Secondary | ICD-10-CM | POA: Diagnosis not present

## 2020-08-01 DIAGNOSIS — R262 Difficulty in walking, not elsewhere classified: Secondary | ICD-10-CM | POA: Diagnosis not present

## 2020-08-01 DIAGNOSIS — R338 Other retention of urine: Secondary | ICD-10-CM | POA: Diagnosis not present

## 2020-08-01 DIAGNOSIS — Z741 Need for assistance with personal care: Secondary | ICD-10-CM | POA: Diagnosis not present

## 2020-08-01 DIAGNOSIS — R2681 Unsteadiness on feet: Secondary | ICD-10-CM | POA: Diagnosis not present

## 2020-08-01 DIAGNOSIS — S72145D Nondisplaced intertrochanteric fracture of left femur, subsequent encounter for closed fracture with routine healing: Secondary | ICD-10-CM | POA: Diagnosis not present

## 2020-08-02 DIAGNOSIS — S72145D Nondisplaced intertrochanteric fracture of left femur, subsequent encounter for closed fracture with routine healing: Secondary | ICD-10-CM | POA: Diagnosis not present

## 2020-08-02 DIAGNOSIS — R338 Other retention of urine: Secondary | ICD-10-CM | POA: Diagnosis not present

## 2020-08-02 DIAGNOSIS — R262 Difficulty in walking, not elsewhere classified: Secondary | ICD-10-CM | POA: Diagnosis not present

## 2020-08-02 DIAGNOSIS — I10 Essential (primary) hypertension: Secondary | ICD-10-CM | POA: Diagnosis not present

## 2020-08-02 DIAGNOSIS — R278 Other lack of coordination: Secondary | ICD-10-CM | POA: Diagnosis not present

## 2020-08-02 DIAGNOSIS — Z741 Need for assistance with personal care: Secondary | ICD-10-CM | POA: Diagnosis not present

## 2020-08-02 DIAGNOSIS — R2681 Unsteadiness on feet: Secondary | ICD-10-CM | POA: Diagnosis not present

## 2020-08-03 DIAGNOSIS — I350 Nonrheumatic aortic (valve) stenosis: Secondary | ICD-10-CM | POA: Diagnosis not present

## 2020-08-03 DIAGNOSIS — R197 Diarrhea, unspecified: Secondary | ICD-10-CM | POA: Diagnosis not present

## 2020-08-03 DIAGNOSIS — B351 Tinea unguium: Secondary | ICD-10-CM | POA: Diagnosis not present

## 2020-08-03 DIAGNOSIS — F0150 Vascular dementia without behavioral disturbance: Secondary | ICD-10-CM | POA: Diagnosis not present

## 2020-08-03 DIAGNOSIS — N183 Chronic kidney disease, stage 3 unspecified: Secondary | ICD-10-CM | POA: Diagnosis not present

## 2020-08-03 DIAGNOSIS — F39 Unspecified mood [affective] disorder: Secondary | ICD-10-CM | POA: Diagnosis not present

## 2020-08-03 DIAGNOSIS — I456 Pre-excitation syndrome: Secondary | ICD-10-CM | POA: Diagnosis not present

## 2020-08-03 DIAGNOSIS — I10 Essential (primary) hypertension: Secondary | ICD-10-CM | POA: Diagnosis not present

## 2020-08-04 DIAGNOSIS — R2681 Unsteadiness on feet: Secondary | ICD-10-CM | POA: Diagnosis not present

## 2020-08-04 DIAGNOSIS — I10 Essential (primary) hypertension: Secondary | ICD-10-CM | POA: Diagnosis not present

## 2020-08-04 DIAGNOSIS — S72145D Nondisplaced intertrochanteric fracture of left femur, subsequent encounter for closed fracture with routine healing: Secondary | ICD-10-CM | POA: Diagnosis not present

## 2020-08-04 DIAGNOSIS — R278 Other lack of coordination: Secondary | ICD-10-CM | POA: Diagnosis not present

## 2020-08-04 DIAGNOSIS — R262 Difficulty in walking, not elsewhere classified: Secondary | ICD-10-CM | POA: Diagnosis not present

## 2020-08-04 DIAGNOSIS — Z741 Need for assistance with personal care: Secondary | ICD-10-CM | POA: Diagnosis not present

## 2020-08-04 DIAGNOSIS — R338 Other retention of urine: Secondary | ICD-10-CM | POA: Diagnosis not present

## 2020-08-22 DIAGNOSIS — I350 Nonrheumatic aortic (valve) stenosis: Secondary | ICD-10-CM | POA: Diagnosis not present

## 2020-08-22 DIAGNOSIS — I10 Essential (primary) hypertension: Secondary | ICD-10-CM | POA: Diagnosis not present

## 2020-09-22 DIAGNOSIS — F0150 Vascular dementia without behavioral disturbance: Secondary | ICD-10-CM

## 2020-09-22 DIAGNOSIS — I350 Nonrheumatic aortic (valve) stenosis: Secondary | ICD-10-CM | POA: Diagnosis not present

## 2020-09-22 DIAGNOSIS — I455 Other specified heart block: Secondary | ICD-10-CM | POA: Diagnosis not present

## 2020-09-22 DIAGNOSIS — I10 Essential (primary) hypertension: Secondary | ICD-10-CM | POA: Diagnosis not present

## 2020-09-22 DIAGNOSIS — F39 Unspecified mood [affective] disorder: Secondary | ICD-10-CM

## 2020-12-07 DIAGNOSIS — I456 Pre-excitation syndrome: Secondary | ICD-10-CM | POA: Diagnosis not present

## 2020-12-07 DIAGNOSIS — R197 Diarrhea, unspecified: Secondary | ICD-10-CM | POA: Diagnosis not present

## 2020-12-07 DIAGNOSIS — I350 Nonrheumatic aortic (valve) stenosis: Secondary | ICD-10-CM | POA: Diagnosis not present

## 2020-12-07 DIAGNOSIS — N183 Chronic kidney disease, stage 3 unspecified: Secondary | ICD-10-CM | POA: Diagnosis not present

## 2020-12-07 DIAGNOSIS — I10 Essential (primary) hypertension: Secondary | ICD-10-CM | POA: Diagnosis not present

## 2020-12-07 DIAGNOSIS — F0150 Vascular dementia without behavioral disturbance: Secondary | ICD-10-CM | POA: Diagnosis not present

## 2020-12-07 DIAGNOSIS — M1990 Unspecified osteoarthritis, unspecified site: Secondary | ICD-10-CM | POA: Diagnosis not present

## 2020-12-07 DIAGNOSIS — F39 Unspecified mood [affective] disorder: Secondary | ICD-10-CM | POA: Diagnosis not present

## 2021-03-22 ENCOUNTER — Encounter: Payer: Self-pay | Admitting: Internal Medicine

## 2021-03-29 DIAGNOSIS — K591 Functional diarrhea: Secondary | ICD-10-CM

## 2021-03-29 DIAGNOSIS — I350 Nonrheumatic aortic (valve) stenosis: Secondary | ICD-10-CM

## 2021-03-29 DIAGNOSIS — I456 Pre-excitation syndrome: Secondary | ICD-10-CM

## 2021-03-29 DIAGNOSIS — F0150 Vascular dementia without behavioral disturbance: Secondary | ICD-10-CM | POA: Diagnosis not present

## 2021-03-29 DIAGNOSIS — I10 Essential (primary) hypertension: Secondary | ICD-10-CM | POA: Diagnosis not present

## 2021-03-29 DIAGNOSIS — M1990 Unspecified osteoarthritis, unspecified site: Secondary | ICD-10-CM | POA: Diagnosis not present

## 2021-03-29 DIAGNOSIS — F39 Unspecified mood [affective] disorder: Secondary | ICD-10-CM | POA: Diagnosis not present

## 2021-03-29 DIAGNOSIS — B351 Tinea unguium: Secondary | ICD-10-CM | POA: Diagnosis not present

## 2021-06-12 DIAGNOSIS — F01B3 Vascular dementia, moderate, with mood disturbance: Secondary | ICD-10-CM

## 2021-06-12 DIAGNOSIS — F0150 Vascular dementia without behavioral disturbance: Secondary | ICD-10-CM | POA: Diagnosis not present

## 2021-06-12 DIAGNOSIS — I350 Nonrheumatic aortic (valve) stenosis: Secondary | ICD-10-CM

## 2021-06-12 DIAGNOSIS — F39 Unspecified mood [affective] disorder: Secondary | ICD-10-CM | POA: Diagnosis not present

## 2021-06-12 DIAGNOSIS — I10 Essential (primary) hypertension: Secondary | ICD-10-CM

## 2021-07-28 DIAGNOSIS — I350 Nonrheumatic aortic (valve) stenosis: Secondary | ICD-10-CM

## 2021-07-28 DIAGNOSIS — G210 Malignant neuroleptic syndrome: Secondary | ICD-10-CM

## 2021-07-28 DIAGNOSIS — I10 Essential (primary) hypertension: Secondary | ICD-10-CM | POA: Diagnosis not present

## 2021-07-28 DIAGNOSIS — F0150 Vascular dementia without behavioral disturbance: Secondary | ICD-10-CM | POA: Diagnosis not present

## 2021-07-28 DIAGNOSIS — R197 Diarrhea, unspecified: Secondary | ICD-10-CM

## 2021-07-28 DIAGNOSIS — F39 Unspecified mood [affective] disorder: Secondary | ICD-10-CM | POA: Diagnosis not present

## 2021-07-28 DIAGNOSIS — M1990 Unspecified osteoarthritis, unspecified site: Secondary | ICD-10-CM | POA: Diagnosis not present

## 2021-08-02 IMAGING — XA DG HIP (WITH PELVIS) OPERATIVE*L*
5 series · 5 of 5 positions shown · non-contrast
Comparison: 04/11/2020

CLINICAL DATA: Status post left IM nail.

EXAM:
OPERATIVE left HIP (WITH PELVIS IF PERFORMED) 2 VIEWS
FLUOROSCOPY TIME:  24 seconds
TECHNIQUE: Fluoroscopic spot image(s) were submitted for interpretation
post-operatively.

[Series 1: cont. · 1 of 1 slices shown (1 of 5)]
[im 1/1]
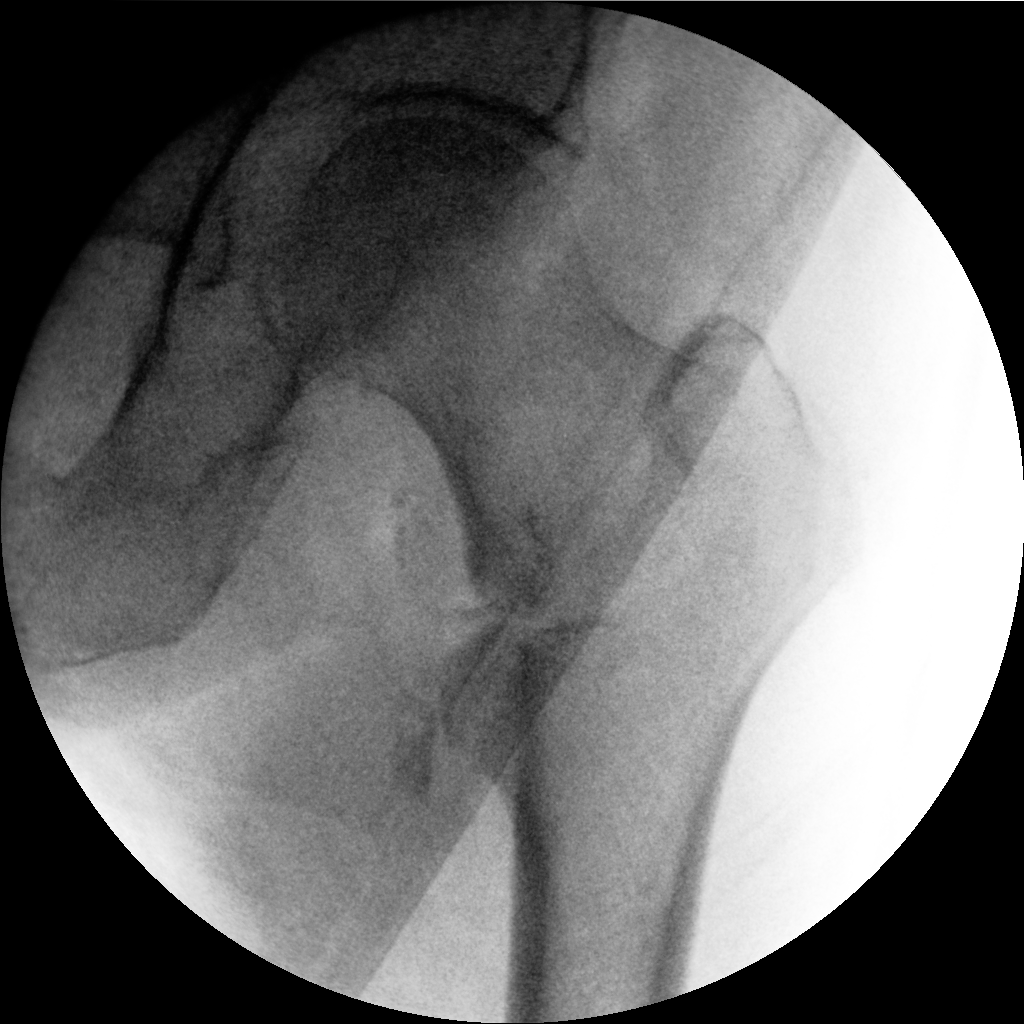

[Series 2: cont. · 1 of 1 slices shown (2 of 5)]
[im 1/1]
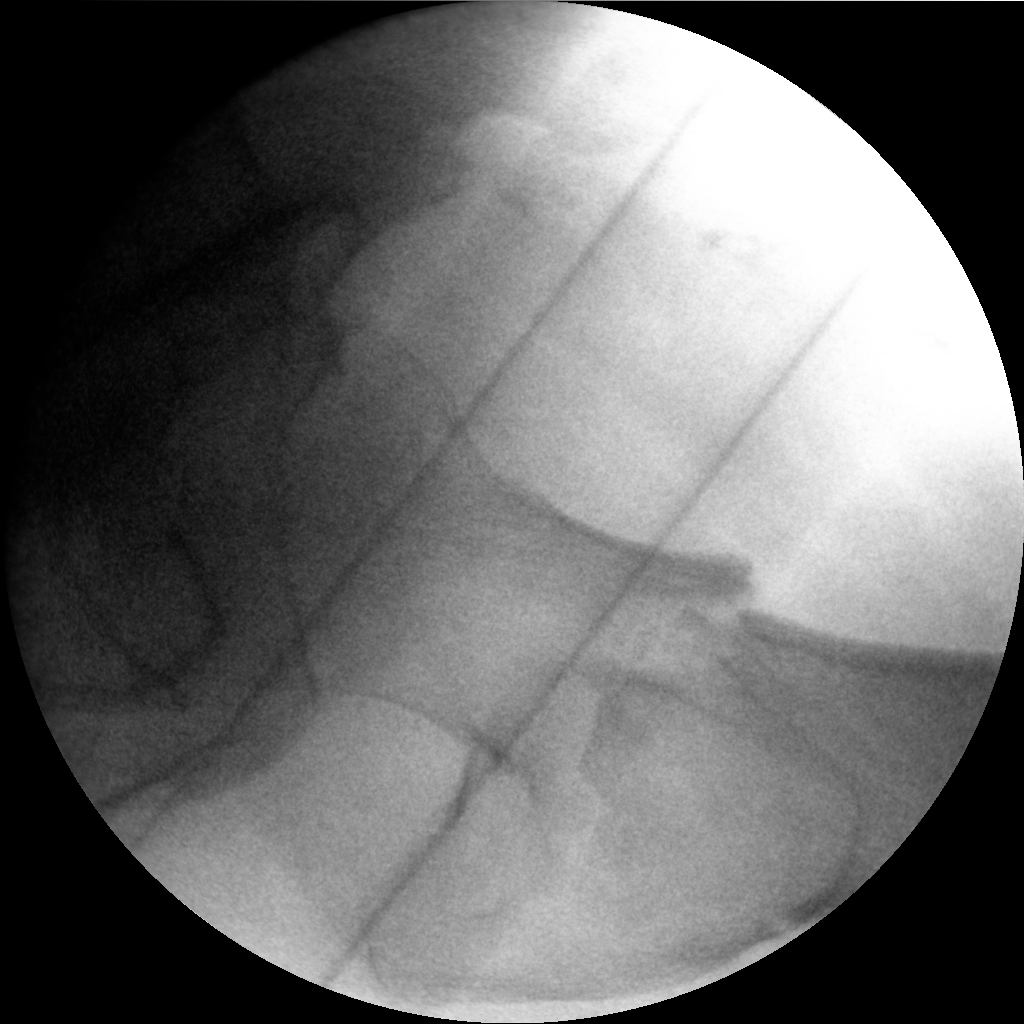

[Series 8: cont. · 1 of 1 slices shown (3 of 5)]
[im 1/1]
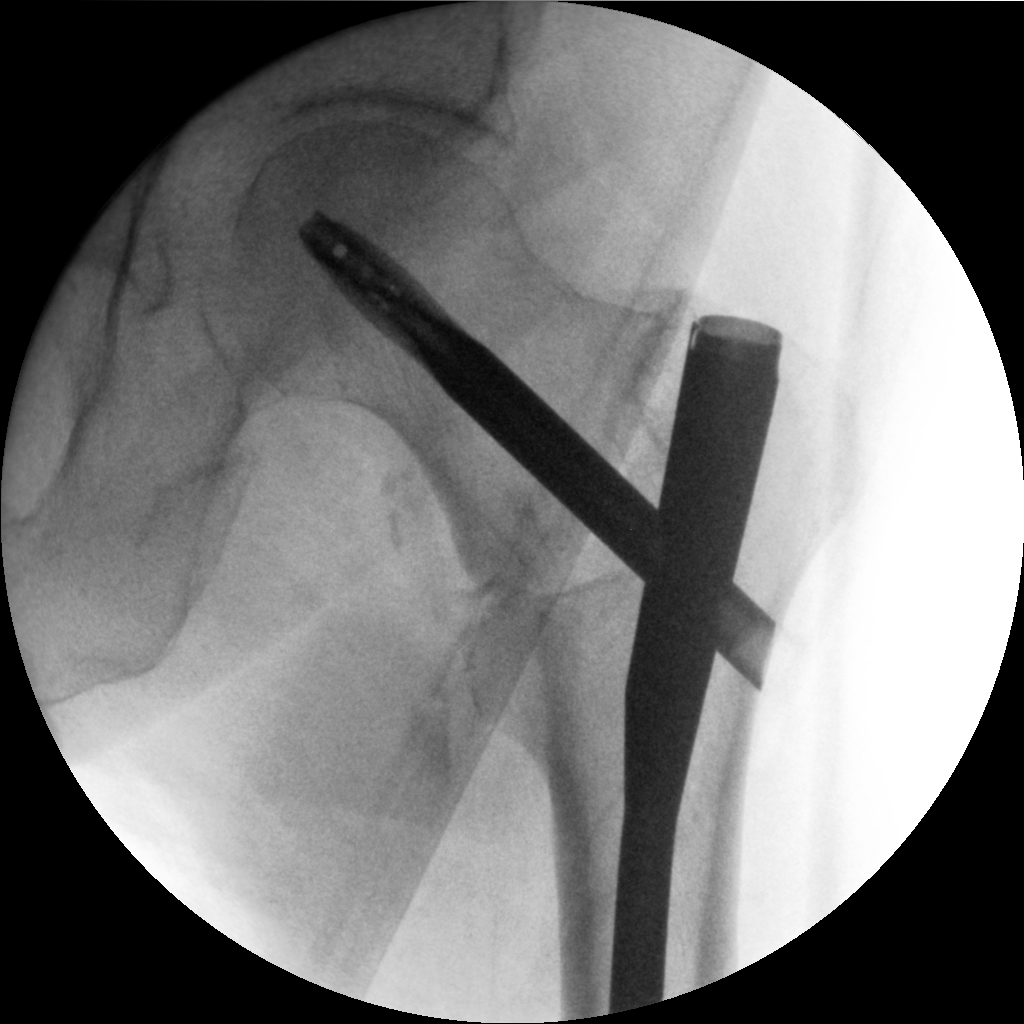

[Series 9: cont. · 1 of 1 slices shown (4 of 5)]
[im 1/1]
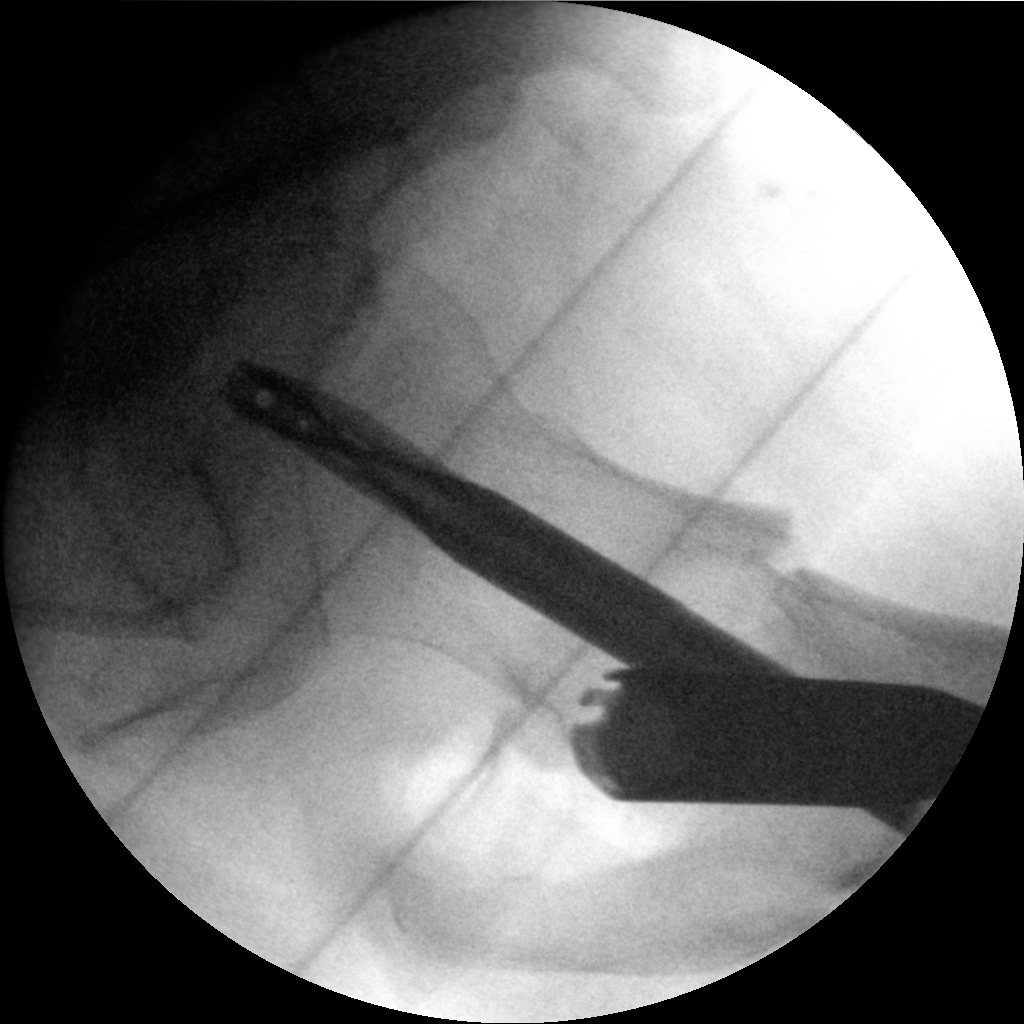

[Series 10: cont. · 1 of 1 slices shown (5 of 5)]
[im 1/1]
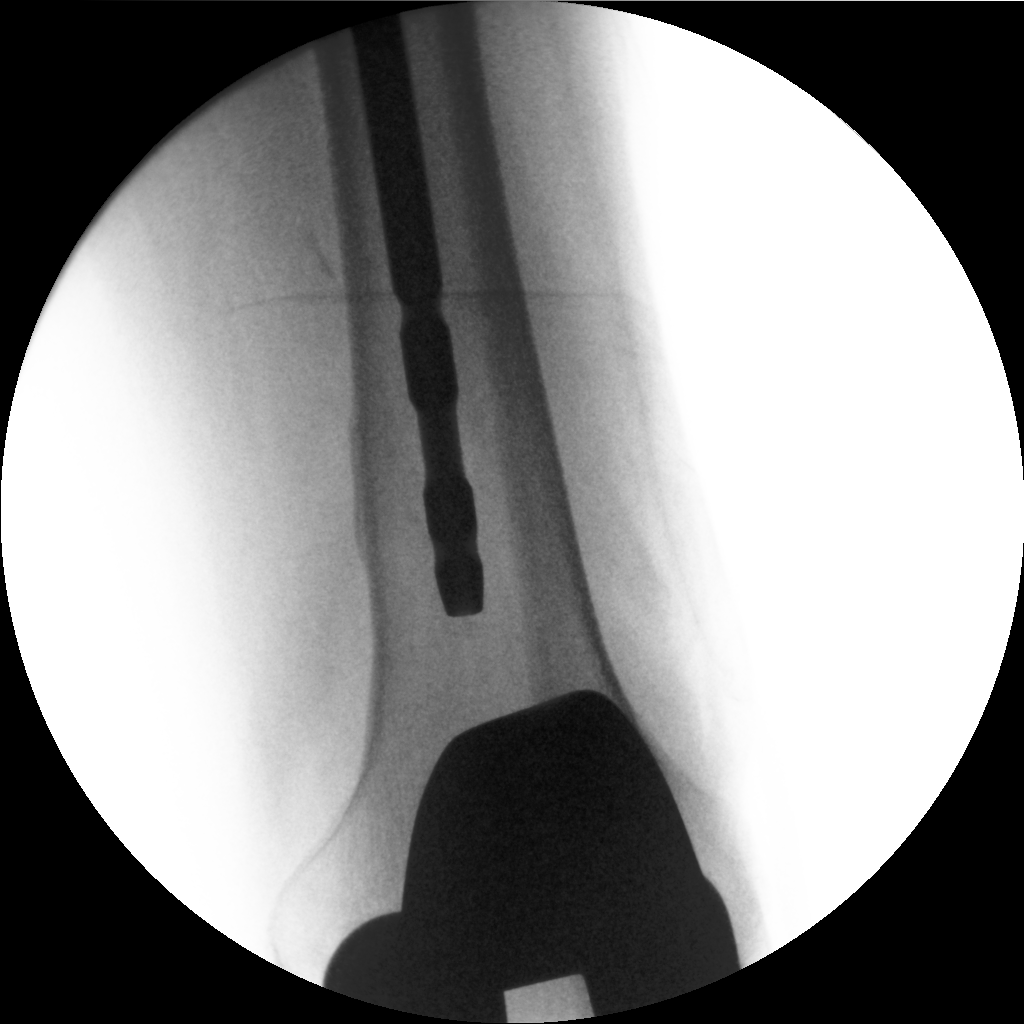

[5 of 5 positions shown; findings below may reference images not displayed]

FINDINGS: Five sequential images obtained via portable C-arm radiography in
the operating room show open reduction and internal fixation of the
intertrochanteric fracture involving the proximal left femur.
Placement of IM nail and hip screw noted. Fracture fragments are in
anatomic alignment.
IMPRESSION: Status post ORIF of intertrochanteric fracture involving the
proximal left femur.

## 2021-08-03 DIAGNOSIS — I10 Essential (primary) hypertension: Secondary | ICD-10-CM | POA: Diagnosis not present

## 2021-08-21 DIAGNOSIS — I10 Essential (primary) hypertension: Secondary | ICD-10-CM | POA: Diagnosis not present

## 2021-08-21 DIAGNOSIS — I456 Pre-excitation syndrome: Secondary | ICD-10-CM | POA: Diagnosis not present

## 2021-08-21 LAB — BASIC METABOLIC PANEL
BUN: 26 — AB (ref 4–21)
CO2: 28 — AB (ref 13–22)
Chloride: 104 (ref 99–108)
Creatinine: 1 (ref 0.6–1.3)
Glucose: 69
Potassium: 3.9 mEq/L (ref 3.5–5.1)
Sodium: 139 (ref 137–147)

## 2021-08-21 LAB — HEPATIC FUNCTION PANEL
ALT: 18 U/L (ref 10–40)
AST: 21 (ref 14–40)
Alkaline Phosphatase: 69 (ref 25–125)
Bilirubin, Total: 0.4

## 2021-08-21 LAB — CBC AND DIFFERENTIAL
HCT: 38 — AB (ref 41–53)
Hemoglobin: 13.1 — AB (ref 13.5–17.5)
Platelets: 217 10*3/uL (ref 150–400)
WBC: 7.1

## 2021-08-21 LAB — COMPREHENSIVE METABOLIC PANEL
Albumin: 3.6 (ref 3.5–5.0)
Calcium: 8.8 (ref 8.7–10.7)
Globulin: 2.5
eGFR: 70

## 2021-08-21 LAB — CBC: RBC: 3.99 (ref 3.87–5.11)

## 2021-10-12 DIAGNOSIS — F39 Unspecified mood [affective] disorder: Secondary | ICD-10-CM | POA: Diagnosis not present

## 2021-10-12 DIAGNOSIS — F0150 Vascular dementia without behavioral disturbance: Secondary | ICD-10-CM

## 2021-10-12 DIAGNOSIS — K529 Noninfective gastroenteritis and colitis, unspecified: Secondary | ICD-10-CM | POA: Diagnosis not present

## 2021-10-12 DIAGNOSIS — I10 Essential (primary) hypertension: Secondary | ICD-10-CM | POA: Diagnosis not present

## 2021-10-12 DIAGNOSIS — I350 Nonrheumatic aortic (valve) stenosis: Secondary | ICD-10-CM | POA: Diagnosis not present

## 2021-11-24 DIAGNOSIS — I350 Nonrheumatic aortic (valve) stenosis: Secondary | ICD-10-CM | POA: Diagnosis not present

## 2021-11-24 DIAGNOSIS — F39 Unspecified mood [affective] disorder: Secondary | ICD-10-CM | POA: Diagnosis not present

## 2021-11-24 DIAGNOSIS — F0150 Vascular dementia without behavioral disturbance: Secondary | ICD-10-CM | POA: Diagnosis not present

## 2021-11-24 DIAGNOSIS — R197 Diarrhea, unspecified: Secondary | ICD-10-CM

## 2021-11-24 DIAGNOSIS — M9190 Juvenile osteochondrosis of hip and pelvis, unspecified, unspecified leg: Secondary | ICD-10-CM

## 2021-11-24 DIAGNOSIS — N183 Chronic kidney disease, stage 3 unspecified: Secondary | ICD-10-CM

## 2021-11-24 DIAGNOSIS — I456 Pre-excitation syndrome: Secondary | ICD-10-CM

## 2021-11-24 DIAGNOSIS — I10 Essential (primary) hypertension: Secondary | ICD-10-CM | POA: Diagnosis not present

## 2022-03-29 ENCOUNTER — Non-Acute Institutional Stay: Payer: Medicare Other | Admitting: Nurse Practitioner

## 2022-03-29 ENCOUNTER — Encounter: Payer: Self-pay | Admitting: Nurse Practitioner

## 2022-03-29 DIAGNOSIS — N1831 Chronic kidney disease, stage 3a: Secondary | ICD-10-CM

## 2022-03-29 DIAGNOSIS — I350 Nonrheumatic aortic (valve) stenosis: Secondary | ICD-10-CM | POA: Diagnosis not present

## 2022-03-29 DIAGNOSIS — I10 Essential (primary) hypertension: Secondary | ICD-10-CM

## 2022-03-29 DIAGNOSIS — F01B0 Vascular dementia, moderate, without behavioral disturbance, psychotic disturbance, mood disturbance, and anxiety: Secondary | ICD-10-CM | POA: Diagnosis not present

## 2022-03-29 DIAGNOSIS — F39 Unspecified mood [affective] disorder: Secondary | ICD-10-CM

## 2022-03-29 DIAGNOSIS — K529 Noninfective gastroenteritis and colitis, unspecified: Secondary | ICD-10-CM

## 2022-03-29 NOTE — Progress Notes (Signed)
Location:  [facility] Room Number: 203-A Place of Service:  [phone]) Provider:  Micah Flesher, MD  Patient Care Team: Dewayne Shorter, MD as PCP - General [facility])  Extended Emergency Contact Information Primary Emergency Contact: Smith,Kendal Address: [place], [place] [address] [address] [place] Phone: [phone] Mobile Phone: [phone] Relation: Daughter  Code Status:  DNR Goals of care: Advanced Directive information    03/29/2022   10:43 AM  Advanced Directives  Does Patient Have a Medical Advance Directive? Yes  Type of [place];Living will;Out of facility DNR (pink MOST or yellow form)  Does patient want to make changes to medical advance directive? No - Patient declined  Copy of [place] in Chart? No - copy requested     Chief Complaint  Patient presents with   Routine Follow-up    HPI:  Pt is a 88 y.o. male seen today for medical management of chronic diseases at [facility].  Staff reports patient has been doing well. He    Past Medical History:  Diagnosis Date   History of prostate cancer    RT 2006   Hx of colonic polyps    Hypertension    Mild cognitive impairment    OSA (obstructive sleep apnea)    on CPAP of 10   Rosacea    Tremor, essential    Wolff-Parkinson-White syndrome 1970   Past Surgical History:  Procedure Laterality Date   INTRAMEDULLARY (IM) NAIL INTERTROCHANTERIC Left 04/12/2020   Procedure: INTRAMEDULLARY (IM) NAIL INTERTROCHANTRIC;  Surgeon: Lovell Sheehan, MD;  Location: [facility] ORS;  Service: Orthopedics;  Laterality: Left;   TONSILLECTOMY     TOTAL KNEE ARTHROPLASTY Left 3/14    Allergies  Allergen Reactions   Lisinopril     REACTION: cough   Lactose Intolerance (Gi) Diarrhea    Outpatient Encounter Medications as of 03/29/2022  Medication Sig   acetaminophen  (TYLENOL) 325 MG tablet Take 650 mg by mouth 3 (three) times daily.   acetaminophen (TYLENOL) 500 MG tablet Take 500 mg by mouth every 12 (twelve) hours as needed.   amLODipine (NORVASC) 10 MG tablet TAKE 1 TABLET BY MOUTH EVERY DAY   cholestyramine (QUESTRAN) 4 g packet Take 4 g by mouth at bedtime.   guaifenesin (ROBITUSSIN) 100 MG/5ML syrup Take 100 mg by mouth every 4 (four) hours as needed for cough.   hydrochlorothiazide (HYDRODIURIL) 25 MG tablet Take 1 tablet (25 mg total) by mouth daily.   loratadine (CLARITIN) 10 MG tablet Take 10 mg by mouth daily.   losartan (COZAAR) 100 MG tablet Take 100 mg by mouth daily.   memantine (NAMENDA) 10 MG tablet Take 10 mg by mouth 2 (two) times daily.   Multiple Vitamins-Minerals (MENS MULTI VITAMIN & MINERAL PO)    sertraline (ZOLOFT) 25 MG tablet Take 25 mg by mouth daily.   enoxaparin (LOVENOX) 40 MG/0.4ML injection Inject 0.4 mLs (40 mg total) into the skin daily. (Patient not taking: Reported on 03/29/2022)   [DISCONTINUED] memantine (NAMENDA) 5 MG tablet Take 5 mg by mouth 2 (two) times daily.   No facility-administered encounter medications on file as of 03/29/2022.    Review of Systems  Constitutional:  Negative for activity change, appetite change, fatigue and unexpected weight change.  HENT:  Positive for hearing loss. Negative for congestion.   Eyes: Negative.  Respiratory:  Negative for cough and shortness of breath.   Cardiovascular:  Negative for chest pain, palpitations and leg swelling.  Gastrointestinal:  Negative for abdominal pain, constipation and diarrhea.  Genitourinary:  Negative for difficulty urinating and dysuria.  Musculoskeletal:  Negative for arthralgias and myalgias.  Skin:  Negative for color change and wound.  Neurological:  Negative for dizziness and weakness.  Psychiatric/Behavioral:  Negative for agitation, behavioral problems and confusion.     Immunization History  Administered Date(s) Administered    Influenza, Seasonal, Injecte, Preservative Fre 02/16/2016   Influenza-Unspecified 02/09/2014, 02/21/2015   Moderna SARS-COV2 Booster Vaccination 09/19/2021   Moderna Sars-Covid-2 Vaccination 09/09/2020   PFIZER Comirnaty(Gray Top)Covid-19 Tri-Sucrose Vaccine 03/02/2022   Pfizer Covid-19 Vaccine Bivalent Booster 78yr & up 01/12/2021   Pneumococcal Conjugate-13 05/19/2014   Pneumococcal Polysaccharide-23 04/23/2005   Td 04/23/2005   Tetanus 05/27/2015   Zoster, Live 05/28/2013   Pertinent  Health Maintenance Due  Topic Date Due   INFLUENZA VACCINE  11/21/2021      04/13/2020    8:00 AM 04/14/2020    1:00 AM 04/14/2020    8:00 AM 04/15/2020    3:00 AM 04/15/2020    7:56 AM  Fall Risk  Patient Fall Risk Level High fall risk High fall risk High fall risk High fall risk High fall risk   Functional Status Survey:    Vitals:   03/29/22 0957 03/29/22 1217  BP: (!) 173/80 (!) 143/76  Pulse: (!) 59   Resp: 13   Temp: (!) 96.5 F (35.8 C)   SpO2: 95%   Weight: 181 lb (82.1 kg)   Height: '5\' 10"'$  (1.778 m)    Body mass index is 25.97 kg/m. Physical Exam Constitutional:      General: He is not in acute distress.    Appearance: He is well-developed. He is not diaphoretic.  HENT:     Head: Normocephalic and atraumatic.     Right Ear: External ear normal.     Left Ear: External ear normal.     Mouth/Throat:     Pharynx: No oropharyngeal exudate.  Eyes:     Conjunctiva/sclera: Conjunctivae normal.     Pupils: Pupils are equal, round, and reactive to light.  Cardiovascular:     Rate and Rhythm: Normal rate and regular rhythm.     Heart sounds: Murmur heard.  Pulmonary:     Effort: Pulmonary effort is normal.     Breath sounds: Normal breath sounds.  Abdominal:     General: Bowel sounds are normal.     Palpations: Abdomen is soft.  Musculoskeletal:        General: No tenderness.     Cervical back: Normal range of motion and neck supple.     Right lower leg: No edema.      Left lower leg: No edema.  Skin:    General: Skin is warm and dry.  Neurological:     Mental Status: He is alert and oriented to person, place, and time.     Labs reviewed: Recent Labs    08/21/21 0000  NA 139  K 3.9  CL 104  CO2 28*  BUN 26*  CREATININE 1.0  CALCIUM 8.8   Recent Labs    08/21/21 0000  AST 21  ALT 18  ALKPHOS 69  ALBUMIN 3.6   Recent Labs    08/21/21 0000  WBC 7.1  HGB 13.1*  HCT 38*  PLT 217   Lab Results  Component Value Date  TSH 1.22 05/12/2013   No results found for: "HGBA1C" No results found for: "CHOL", "HDL", "LDLCALC", "LDLDIRECT", "TRIG", "CHOLHDL"  Significant Diagnostic Results in last 30 days:  No results found.  Assessment/Plan 1. vascular dementia without behavioral disturbance, psychotic disturbance, mood disturbance, or anxiety (HCC) -Stable, no acute changes in cognitive or functional status, continue supportive care by AL staff  2. Stage 3a chronic kidney disease (HCC) -Chronic and stable Encourage proper hydration Follow metabolic panel- will update labs Avoid nephrotoxic meds (NSAIDS)  3. Mood disorder (HCC) -stable on zoloft  4. Essential hypertension, benign -bp is variable, at risk for hypotension, will continue losartan, norvasc and hctz at this time and will monitor.  Will update cbc and cmp  5. Aortic valve stenosis, etiology of cardiac valve disease unspecified -stable, no symptoms of shortness of breath or edema  6. Chronic diarrhea -continues on questran for symptoms management.   Carlos American. [place], [facility] [phone]

## 2022-04-05 DIAGNOSIS — I10 Essential (primary) hypertension: Secondary | ICD-10-CM | POA: Diagnosis not present

## 2022-04-05 LAB — HEPATIC FUNCTION PANEL
ALT: 27 U/L (ref 10–40)
AST: 26 (ref 14–40)
Alkaline Phosphatase: 64 (ref 25–125)
Bilirubin, Total: 0.4

## 2022-04-05 LAB — CBC AND DIFFERENTIAL
HCT: 41 (ref 41–53)
Hemoglobin: 13.7 (ref 13.5–17.5)
Neutrophils Absolute: 4304
Platelets: 242 10*3/uL (ref 150–400)
WBC: 7.7

## 2022-04-05 LAB — BASIC METABOLIC PANEL
BUN: 27 — AB (ref 4–21)
CO2: 25 — AB (ref 13–22)
Chloride: 105 (ref 99–108)
Creatinine: 1.2 (ref 0.6–1.3)
Glucose: 82
Potassium: 4.1 mEq/L (ref 3.5–5.1)
Sodium: 140 (ref 137–147)

## 2022-04-05 LAB — COMPREHENSIVE METABOLIC PANEL
Albumin: 3.8 (ref 3.5–5.0)
Calcium: 9.2 (ref 8.7–10.7)
Globulin: 2.8

## 2022-04-05 LAB — CBC: RBC: 4.26 (ref 3.87–5.11)

## 2022-05-21 DIAGNOSIS — K080 Exfoliation of teeth due to systemic causes: Secondary | ICD-10-CM | POA: Diagnosis not present

## 2022-06-28 ENCOUNTER — Encounter: Payer: Self-pay | Admitting: Nurse Practitioner

## 2022-06-28 ENCOUNTER — Non-Acute Institutional Stay (SKILLED_NURSING_FACILITY): Payer: Medicare Other | Admitting: Nurse Practitioner

## 2022-06-28 DIAGNOSIS — F01B0 Vascular dementia, moderate, without behavioral disturbance, psychotic disturbance, mood disturbance, and anxiety: Secondary | ICD-10-CM

## 2022-06-28 DIAGNOSIS — N1831 Chronic kidney disease, stage 3a: Secondary | ICD-10-CM

## 2022-06-28 DIAGNOSIS — I350 Nonrheumatic aortic (valve) stenosis: Secondary | ICD-10-CM

## 2022-06-28 DIAGNOSIS — F39 Unspecified mood [affective] disorder: Secondary | ICD-10-CM

## 2022-06-28 DIAGNOSIS — I10 Essential (primary) hypertension: Secondary | ICD-10-CM

## 2022-06-28 DIAGNOSIS — Z66 Do not resuscitate: Secondary | ICD-10-CM

## 2022-06-28 NOTE — Progress Notes (Signed)
Location:  Other [facility]) Nursing Home Room Number: [place] of Service:  ALF (13)  [place] Pandey K. Dewaine Oats, NP   Patient Care Team: Dewayne Shorter, MD as PCP - General [facility] Medicine)  Extended Emergency Contact Information Primary Emergency Contact: Smith,Kendal Address: [place], [place] [address] [address] of [place] Phone: [phone] Mobile Phone: [phone] Relation: Daughter  Goals of care: Advanced Directive information    06/28/2022    8:52 AM  Advanced Directives  Does Patient Have a Medical Advance Directive? Yes  Type of Paramedic of [place];Living will;Out of facility DNR (pink MOST or yellow form)  Does patient want to make changes to medical advance directive? No - Patient declined  Copy of [place] in Chart? Yes - validated most recent copy scanned in chart (See row information)  Pre-existing out of facility DNR order (yellow form or pink MOST form) Yellow form placed in chart (order not valid for inpatient use)     Chief Complaint  Patient presents with   Medical Management of Chronic Issues    Routine visit. Discuss need for shingrix, AWV, and covid boosters or post pone if patient refuses or is not a candidate     HPI:  Pt is a 89 y.o. male seen today for medical management of chronic disease.  Pt with hx of prostate cancer, htn, dementia, ckd, diarrhea, depression.  He is doing well in memory care at [facility].  He has no complaints and feels like he is doing well He using his walker to get around facility Nursing has no concerns.    Past Medical History:  Diagnosis Date   History of prostate cancer    RT 2006   Hx of colonic polyps    Hypertension    Mild cognitive impairment    OSA (obstructive sleep apnea)    on CPAP of 10   Rosacea    Tremor, essential    Wolff-Parkinson-White syndrome 1970   Past Surgical History:  Procedure Laterality Date    INTRAMEDULLARY (IM) NAIL INTERTROCHANTERIC Left 04/12/2020   Procedure: INTRAMEDULLARY (IM) NAIL INTERTROCHANTRIC;  Surgeon: Lovell Sheehan, MD;  Location: [facility] ORS;  Service: Orthopedics;  Laterality: Left;   TONSILLECTOMY     TOTAL KNEE ARTHROPLASTY Left 3/14    Allergies  Allergen Reactions   Lisinopril     REACTION: cough   Lactose Intolerance (Gi) Diarrhea    Outpatient Encounter Medications as of 06/28/2022  Medication Sig   acetaminophen (TYLENOL) 325 MG tablet Take 650 mg by mouth 3 (three) times daily.   acetaminophen (TYLENOL) 500 MG tablet Take 500 mg by mouth every 12 (twelve) hours as needed.   amLODipine (NORVASC) 10 MG tablet TAKE 1 TABLET BY MOUTH EVERY DAY   Benzocaine-Menthol (ORAJEL 2X TOOTHACHE & GUM) 20-0.26 % GEL Use as directed 1 application  in the mouth or throat every 2 (two) hours as needed.   chlorhexidine (PERIDEX) 0.12 % solution Use as directed 30 mLs in the mouth or throat 3 (three) times daily.   cholestyramine (QUESTRAN) 4 g packet Take 4 g by mouth at bedtime.   guaifenesin (ROBITUSSIN) 100 MG/5ML syrup Take 100 mg by mouth every 4 (four) hours as needed for cough.   hydrochlorothiazide (HYDRODIURIL) 25 MG tablet Take 1 tablet (25 mg total) by mouth daily.   loratadine (CLARITIN) 10 MG tablet Take 10 mg by mouth daily.  losartan (COZAAR) 100 MG tablet Take 100 mg by mouth daily.   memantine (NAMENDA) 10 MG tablet Take 10 mg by mouth 2 (two) times daily.   Multiple Vitamins-Minerals (MENS MULTI VITAMIN & MINERAL PO)    sertraline (ZOLOFT) 25 MG tablet Take 25 mg by mouth daily.   No facility-administered encounter medications on file as of 06/28/2022.    Review of Systems  Constitutional:  Negative for activity change, appetite change, fatigue and unexpected weight change.  HENT:  Negative for congestion and hearing loss.   Eyes: Negative.   Respiratory:  Negative for cough and shortness of breath.   Cardiovascular:  Negative for chest pain,  palpitations and leg swelling.  Gastrointestinal:  Negative for abdominal pain, constipation and diarrhea.  Genitourinary:  Negative for difficulty urinating and dysuria.  Musculoskeletal:  Negative for arthralgias and myalgias.  Skin:  Negative for color change and wound.  Neurological:  Negative for dizziness and weakness.  Psychiatric/Behavioral:  Positive for confusion. Negative for agitation and behavioral problems.      Immunization History  Administered Date(s) Administered   Influenza, High Dose Seasonal PF 02/07/2022   Influenza, Seasonal, Injecte, Preservative Fre 02/16/2016   Influenza-Unspecified 02/09/2014, 02/21/2015   Moderna SARS-COV2 Booster Vaccination 09/19/2021   Moderna Sars-Covid-2 Vaccination 09/09/2020   PFIZER Comirnaty(Gray Top)Covid-19 Tri-Sucrose Vaccine 03/02/2022   Pfizer Covid-19 Vaccine Bivalent Booster 59yr & up 01/12/2021   Pneumococcal Conjugate-13 05/19/2014   Pneumococcal Polysaccharide-23 04/23/2005   Td 04/23/2005   Tetanus 05/27/2015   Zoster, Live 05/28/2013   Pertinent  Health Maintenance Due  Topic Date Due   INFLUENZA VACCINE  Completed      04/13/2020    8:00 AM 04/14/2020    1:00 AM 04/14/2020    8:00 AM 04/15/2020    3:00 AM 04/15/2020    7:56 AM  Fall Risk  (RETIRED) Patient Fall Risk Level High fall risk High fall risk High fall risk High fall risk High fall risk   Functional Status Survey:    Vitals:   06/28/22 0850  BP: 130/68  Pulse: 70  Weight: 174 lb (78.9 kg)  Height: '5\' 10"'$  (1.778 m)   Body mass index is 24.97 kg/m. Physical Exam Constitutional:      General: He is not in acute distress.    Appearance: He is well-developed. He is not diaphoretic.  HENT:     Head: Normocephalic and atraumatic.     Right Ear: External ear normal.     Left Ear: External ear normal.     Mouth/Throat:     Pharynx: No oropharyngeal exudate.  Eyes:     Conjunctiva/sclera: Conjunctivae normal.     Pupils: Pupils are equal,  round, and reactive to light.  Cardiovascular:     Rate and Rhythm: Normal rate and regular rhythm.     Heart sounds: Normal heart sounds.  Pulmonary:     Effort: Pulmonary effort is normal.     Breath sounds: Normal breath sounds.  Abdominal:     General: Bowel sounds are normal.     Palpations: Abdomen is soft.  Musculoskeletal:        General: No tenderness.     Cervical back: Normal range of motion and neck supple.     Right lower leg: No edema.     Left lower leg: No edema.  Skin:    General: Skin is warm and dry.  Neurological:     Mental Status: He is alert. Mental status is at baseline.  Motor: Weakness present.     Labs reviewed: Recent Labs    08/21/21 0000 04/05/22 0000  NA 139 140  K 3.9 4.1  CL 104 105  CO2 28* 25*  BUN 26* 27*  CREATININE 1.0 1.2  CALCIUM 8.8 9.2   Recent Labs    08/21/21 0000 04/05/22 0000  AST 21 26  ALT 18 27  ALKPHOS 69 64  ALBUMIN 3.6 3.8   Recent Labs    08/21/21 0000 04/05/22 0000  WBC 7.1 7.7  NEUTROABS  --  4,304.00  HGB 13.1* 13.7  HCT 38* 41  PLT 217 242   Lab Results  Component Value Date   TSH 1.22 05/12/2013   No results found for: "HGBA1C" No results found for: "CHOL", "HDL", "LDLCALC", "LDLDIRECT", "TRIG", "CHOLHDL"  Significant Diagnostic Results in last 30 days:  No results found.  Assessment/Plan 1. Stage 3a chronic kidney disease (HCC) -Chronic and stable Encourage proper hydration Follow metabolic panel Avoid nephrotoxic meds (NSAIDS)  2. Moderate vascular dementia without behavioral disturbance, psychotic disturbance, mood disturbance, or anxiety (HCC) -Stable, no acute changes in cognitive or functional status, continue supportive care with staff and family  3. Essential hypertension, benign --Blood pressure well controlled, goal bp <140/90 Continue current medications and dietary modifications follow metabolic panel  4. Mood disorder (HCC) -stable, continues on zoloft 25 mg by  mouth daily  5. Aortic valve stenosis, etiology of cardiac valve disease unspecified Stable, no LE edema, shortness of breath or chest pains  6. DNR (do not resuscitate) - Do not attempt resuscitation (DNR)    Carlos American. [place], [facility] [phone]

## 2022-07-19 DIAGNOSIS — K080 Exfoliation of teeth due to systemic causes: Secondary | ICD-10-CM | POA: Diagnosis not present

## 2022-07-24 ENCOUNTER — Encounter: Payer: Self-pay | Admitting: Nurse Practitioner

## 2022-07-24 ENCOUNTER — Non-Acute Institutional Stay (INDEPENDENT_AMBULATORY_CARE_PROVIDER_SITE_OTHER): Payer: Medicare Other | Admitting: Nurse Practitioner

## 2022-07-24 DIAGNOSIS — Z0000 Encounter for general adult medical examination without abnormal findings: Secondary | ICD-10-CM | POA: Diagnosis not present

## 2022-07-24 NOTE — Progress Notes (Signed)
Subjective:   Michael Kane is a 89 y.o. male who presents for Medicare Annual/Subsequent preventive examination.  Review of Systems     Cardiac Risk Factors include: advanced age (>75men, >71 women);sedentary lifestyle;male gender;hypertension     Objective:    Today's Vitals   07/24/22 0842 07/24/22 0909  BP: (!) 143/50 (!) 156/67  Pulse: 63   Resp: 16   Temp: 97.7 F (36.5 C)   SpO2: 95%   Weight: 174 lb (78.9 kg)   Height: 5\' 10"  (1.778 m)    Body mass index is 24.97 kg/m.     07/24/2022    8:47 AM 06/28/2022    8:52 AM 03/29/2022   10:43 AM 04/11/2020    3:32 PM 05/27/2015   10:40 AM 05/19/2014   12:38 PM  Advanced Directives  Does Patient Have a Medical Advance Directive? Yes Yes Yes Yes Yes Yes  Type of Paramedic of [place];Out of facility DNR (pink MOST or yellow form);Living will [place];Living will;Out of facility DNR (pink MOST or yellow form) [place];Living will;Out of facility DNR (pink MOST or yellow form) Healthcare Power of [employer] of facility DNR (pink MOST or yellow form) [place];Living will;Out of facility DNR (pink MOST or yellow form)  Does patient want to make changes to medical advance directive? No - Patient declined No - Patient declined No - Patient declined No - Patient declined No - Patient declined No - Patient declined  Copy of [place] in Chart? Yes - validated most recent copy scanned in chart (See row information) Yes - validated most recent copy scanned in chart (See row information) No - copy requested No - copy requested Yes No - copy requested  Pre-existing out of facility DNR order (yellow form or pink MOST form)  Yellow form placed in chart (order not valid for inpatient use)        Current Medications (verified) Outpatient Encounter Medications as of 07/24/2022  Medication Sig   acetaminophen (TYLENOL) 325 MG tablet Take 650 mg  by mouth 3 (three) times daily.   acetaminophen (TYLENOL) 500 MG tablet Take 500 mg by mouth every 12 (twelve) hours as needed.   amLODipine (NORVASC) 10 MG tablet TAKE 1 TABLET BY MOUTH EVERY DAY   Benzocaine-Menthol (ORAJEL 2X TOOTHACHE & GUM) 20-0.26 % GEL Use as directed 1 application  in the mouth or throat every 2 (two) hours as needed.   chlorhexidine (PERIDEX) 0.12 % solution Use as directed 30 mLs in the mouth or throat 3 (three) times daily.   cholestyramine (QUESTRAN) 4 g packet Take 4 g by mouth at bedtime.   hydrochlorothiazide (HYDRODIURIL) 25 MG tablet Take 1 tablet (25 mg total) by mouth daily.   loratadine (CLARITIN) 10 MG tablet Take 10 mg by mouth daily.   losartan (COZAAR) 100 MG tablet Take 100 mg by mouth daily.   memantine (NAMENDA) 10 MG tablet Take 10 mg by mouth 2 (two) times daily.   Multiple Vitamins-Minerals (MENS MULTI VITAMIN & MINERAL PO)    NYSTATIN PO Place and dissolve 61ml buccally four times daily for remove dentures from mouth   sertraline (ZOLOFT) 25 MG tablet Take 25 mg by mouth daily.   [DISCONTINUED] guaifenesin (ROBITUSSIN) 100 MG/5ML syrup Take 100 mg by mouth every 4 (four) hours as needed for cough.   No facility-administered encounter medications on file as of 07/24/2022.    Allergies (verified) Lisinopril and  Lactose intolerance (gi)   History: Past Medical History:  Diagnosis Date   History of prostate cancer    RT 2006   Hx of colonic polyps    Hypertension    Mild cognitive impairment    OSA (obstructive sleep apnea)    on CPAP of 10   Rosacea    Tremor, essential    Wolff-Parkinson-White syndrome 1970   Past Surgical History:  Procedure Laterality Date   INTRAMEDULLARY (IM) NAIL INTERTROCHANTERIC Left 04/12/2020   Procedure: INTRAMEDULLARY (IM) NAIL INTERTROCHANTRIC;  Surgeon: Lovell Sheehan, MD;  Location: [facility] ORS;  Service: Orthopedics;  Laterality: Left;   TONSILLECTOMY     TOTAL KNEE ARTHROPLASTY Left 3/14   Family  History  Problem Relation Age of Onset   Stroke Brother    Social History   Socioeconomic History   Marital status: Widowed    Spouse name: Not on file   Number of children: 3   Years of education: Not on file   Highest education level: Not on file  Occupational History   Occupation: retired Research scientist (physical sciences): retired  Tobacco Use   Smoking status: Former    Types: Cigarettes    Quit date: 04/23/1958    Years since quitting: 64.2   Smokeless tobacco: Never  Vaping Use   Vaping Use: Never used  Substance and Sexual Activity   Alcohol use: Yes    Alcohol/week: 0.0 standard drinks of alcohol    Comment: 1 beer daily   Drug use: No   Sexual activity: Not on file  Other Topics Concern   Not on file  Social History Narrative   Wrote poetry and  short stories.  Occ memoirs (has been published)      Has living will.    Requested DNR so form completed.    Requests daughter [name] as health care POA.    Would accept feeding tube unless vegetative state         Social Determinants of Health   Financial Resource Strain: Not on file  Food Insecurity: Not on file  Transportation Needs: Not on file  Physical Activity: Not on file  Stress: Not on file  Social Connections: Not on file    Tobacco Counseling Counseling given: Not Answered   Clinical Intake:  Pre-visit preparation completed: Yes  Pain : No/denies pain     BMI - recorded: 24 Nutritional Status: BMI of 19-24  Normal Nutritional Risks: None Diabetes: No  How often do you need to have someone help you when you read instructions, pamphlets, or other written materials from your doctor or pharmacy?: 5 - [place] of Daily Living    07/24/2022    8:33 AM  In your present state of health, do you have any difficulty performing the following activities:  Hearing? 1  Vision? 1  Difficulty concentrating or making decisions? 1  Walking or climbing stairs? 1   Dressing or bathing? 1  Doing errands, shopping? 1  Preparing Food and eating ? Y  Using the Toilet? Y  In the past six months, have you accidently leaked urine? Y  Do you have problems with loss of bowel control? Y  Managing your Medications? Y  Managing your Finances? Y  Housekeeping or managing your Housekeeping? Y    Patient Care Team: Dewayne Shorter, MD as PCP - General (Family Medicine)  Indicate any recent Medical Services you may have received  from other than [facility] providers in the past year (date may be approximate).     Assessment:   This is a routine wellness examination for [place].  Hearing/Vision screen No results found.  Dietary issues and exercise activities discussed:     Goals Addressed   None    Depression Screen    08/10/2017    9:37 PM 05/29/2016   10:19 AM 05/27/2015   10:38 AM 05/19/2014   12:38 PM 05/12/2013    8:52 AM 04/22/2012   12:11 PM  PHQ 2/9 Scores  PHQ - 2 Score 0 0  0 0 0  Exception Documentation   Medical reason       Fall Risk    11/21/2018    3:51 PM 08/10/2017    9:37 PM 05/29/2016   10:19 AM 05/27/2015   10:38 AM 05/19/2014   12:38 PM  Fall Risk   Falls in the past year?  No No No No  Comment Emmi Telephone Survey: data to providers prior to load      Comment Emmi Telephone Survey Actual Response =         FALL RISK PREVENTION PERTAINING TO THE HOME:  Any stairs in or around the home? No  If so, are there any without handrails? No  Home free of loose throw rugs in walkways, pet beds, electrical cords, etc? Yes  Adequate lighting in your home to reduce risk of falls? Yes   ASSISTIVE DEVICES UTILIZED TO PREVENT FALLS:  Life alert? No  Use of a cane, walker or w/c? Yes  Grab bars in the bathroom? Yes  Shower chair or bench in shower? Yes  Elevated toilet seat or a handicapped toilet? Yes   TIMED UP AND GO:  Was the test performed? No .    Cognitive Function:        Immunizations Immunization History   Administered Date(s) Administered   Influenza, High Dose Seasonal PF 02/07/2022   Influenza, Seasonal, Injecte, Preservative Fre 02/16/2016   Influenza-Unspecified 02/09/2014, 02/21/2015   Moderna SARS-COV2 Booster Vaccination 09/19/2021   Moderna Sars-Covid-2 Vaccination 09/09/2020   PFIZER Comirnaty(Gray Top)Covid-19 Tri-Sucrose Vaccine 03/02/2022   Pfizer Covid-19 Vaccine Bivalent Booster 28yrs & up 01/12/2021   Pneumococcal Conjugate-13 05/19/2014   Pneumococcal Polysaccharide-23 04/23/2005   Td 04/23/2005   Tetanus 05/27/2015   Zoster, Live 05/28/2013    TDAP status: Up to date  Flu Vaccine status: Up to date  Pneumococcal vaccine status: Up to date  Covid-19 vaccine status: Information provided on how to obtain vaccines.   Qualifies for Shingles Vaccine? Yes   Zostavax completed No   Shingrix Completed?: No.    Education has been provided regarding the importance of this vaccine. Patient has been advised to call insurance company to determine out of pocket expense if they have not yet received this vaccine. Advised may also receive vaccine at local pharmacy or Health Dept. Verbalized acceptance and understanding. Daughter agreeable to have staff at memory care administer vaccine.   Screening Tests Health Maintenance  Topic Date Due   Zoster Vaccines- Shingrix (1 of 2) Never done   COVID-19 Vaccine (5 - 2023-24 season) 04/27/2022   INFLUENZA VACCINE  11/22/2022   Medicare Annual Wellness (AWV)  07/24/2023   DTaP/Tdap/Td (3 - Tdap) 05/26/2025   Pneumonia Vaccine 20+ Years old  Completed   HPV VACCINES  Aged Out    Health Maintenance  Health Maintenance Due  Topic Date Due   Zoster Vaccines- Shingrix (1 of 2) Never  done   COVID-19 Vaccine (5 - 2023-24 season) 04/27/2022    Colorectal cancer screening: No longer required.   Lung Cancer Screening: (Low Dose CT Chest recommended if Age 41-80 years, 30 pack-year currently smoking OR have quit w/in 15years.) does not  qualify.   Additional Screening:  Hepatitis C Screening: does not qualify; Completed na  Vision Screening: Recommended annual ophthalmology exams for early detection of glaucoma and other disorders of the eye. Is the patient up to date with their annual eye exam?  No  Due to dementia   Dental Screening: Recommended annual dental exams for proper oral hygiene  Community Resource Referral / Chronic Care Management: CRR required this visit?  No   CCM required this visit?  No      Plan:     I have personally reviewed and noted the following in the patient's chart:   Medical and social history Use of alcohol, tobacco or illicit drugs  Current medications and supplements including opioid prescriptions. Patient is not currently taking opioid prescriptions. Functional ability and status Nutritional status Physical activity Advanced directives List of other physicians Hospitalizations, surgeries, and ER visits in previous 12 months Vitals Screenings to include cognitive, depression, and falls Referrals and appointments  In addition, I have reviewed and discussed with patient certain preventive protocols, quality metrics, and best practice recommendations. A written personalized care plan for preventive services as well as general preventive health recommendations were provided to patient.     Lauree Chandler, NP   07/24/2022  Place of service: [facility]

## 2022-08-23 DIAGNOSIS — F0390 Unspecified dementia without behavioral disturbance: Secondary | ICD-10-CM | POA: Diagnosis not present

## 2022-08-23 DIAGNOSIS — I10 Essential (primary) hypertension: Secondary | ICD-10-CM | POA: Diagnosis not present

## 2022-08-23 LAB — HEPATIC FUNCTION PANEL
ALT: 15 U/L (ref 10–40)
AST: 19 (ref 14–40)
Alkaline Phosphatase: 60 (ref 25–125)
Bilirubin, Total: 0.3

## 2022-08-23 LAB — BASIC METABOLIC PANEL
BUN: 25 — AB (ref 4–21)
CO2: 26 — AB (ref 13–22)
Chloride: 104 (ref 99–108)
Creatinine: 1.1 (ref 0.6–1.3)
Glucose: 89
Potassium: 3.5 mEq/L (ref 3.5–5.1)
Sodium: 137 (ref 137–147)

## 2022-08-23 LAB — CBC AND DIFFERENTIAL
HCT: 37 — AB (ref 41–53)
Hemoglobin: 12.4 — AB (ref 13.5–17.5)
Neutrophils Absolute: 3749
Platelets: 229 10*3/uL (ref 150–400)
WBC: 7.1

## 2022-08-23 LAB — COMPREHENSIVE METABOLIC PANEL
Albumin: 3.5 (ref 3.5–5.0)
Calcium: 8.7 (ref 8.7–10.7)
Globulin: 2.4
eGFR: 61

## 2022-08-23 LAB — CBC: RBC: 3.84 — AB (ref 3.87–5.11)

## 2022-08-29 ENCOUNTER — Non-Acute Institutional Stay: Payer: Medicare Other | Admitting: Student

## 2022-08-29 ENCOUNTER — Encounter: Payer: Self-pay | Admitting: Student

## 2022-08-29 DIAGNOSIS — N1832 Chronic kidney disease, stage 3b: Secondary | ICD-10-CM | POA: Diagnosis not present

## 2022-08-29 DIAGNOSIS — S72002S Fracture of unspecified part of neck of left femur, sequela: Secondary | ICD-10-CM

## 2022-08-29 DIAGNOSIS — E440 Moderate protein-calorie malnutrition: Secondary | ICD-10-CM

## 2022-08-29 DIAGNOSIS — I700 Atherosclerosis of aorta: Secondary | ICD-10-CM

## 2022-08-29 DIAGNOSIS — F01518 Vascular dementia, unspecified severity, with other behavioral disturbance: Secondary | ICD-10-CM

## 2022-08-29 DIAGNOSIS — M79652 Pain in left thigh: Secondary | ICD-10-CM | POA: Diagnosis not present

## 2022-08-29 NOTE — Progress Notes (Signed)
Location:  Other [facility].  Nursing Home Room Number: [facility] 203A Place of Service:  ALF [phone]) Provider:  Earnestine Mealing, MD  Patient Care Team: Earnestine Mealing, MD as PCP - General [facility] Medicine)  Extended Emergency Contact Information Primary Emergency Contact: Smith,Kendal Address: [address]          [place], [place] [address] Darden Amber of [place] Home Phone: [phone] Mobile Phone: [phone] Relation: Daughter  Code Status:  DNR Goals of care: Advanced Directive information    08/29/2022    8:52 AM  Advanced Directives  Does Patient Have a Medical Advance Directive? Yes  Type of Estate agent of [place];Living will;Out of facility DNR (pink MOST or yellow form)  Does patient want to make changes to medical advance directive? No - Patient declined  Copy of Healthcare Power of Attorney in Chart? Yes - validated most recent copy scanned in chart (See row information)     Chief Complaint  Patient presents with   Acute Visit    Fall    HPI:  Pt is a 89 y.o. male seen today for an acute and routine visit for Fall.   Patient does not remember the circumstances of fall. Denies pain.   Nursing states patient was bowling and did a "spin" after release of the ball and fell. Given hx of fracture, requesting imaging.   Past Medical History:  Diagnosis Date   History of prostate cancer    RT 2006   Hx of colonic polyps    Hypertension    Mild cognitive impairment    OSA (obstructive sleep apnea)    on CPAP of 10   Rosacea    Tremor, essential    Wolff-Parkinson-White syndrome 1970   Past Surgical History:  Procedure Laterality Date   INTRAMEDULLARY (IM) NAIL INTERTROCHANTERIC Left 04/12/2020   Procedure: INTRAMEDULLARY (IM) NAIL INTERTROCHANTRIC;  Surgeon: Lyndle Herrlich, MD;  Location: [facility] ORS;  Service: Orthopedics;  Laterality: Left;   TONSILLECTOMY     TOTAL KNEE ARTHROPLASTY Left 3/14    Allergies  Allergen  Reactions   Lisinopril     REACTION: cough   Lactose Intolerance (Gi) Diarrhea    Outpatient Encounter Medications as of 08/29/2022  Medication Sig   acetaminophen (TYLENOL) 325 MG tablet Take 650 mg by mouth 3 (three) times daily.   acetaminophen (TYLENOL) 500 MG tablet Take 500 mg by mouth every 12 (twelve) hours as needed.   amLODipine (NORVASC) 10 MG tablet TAKE 1 TABLET BY MOUTH EVERY DAY   Benzocaine-Menthol (ORAJEL 2X TOOTHACHE & GUM) 20-0.26 % GEL Use as directed 1 application  in the mouth or throat every 2 (two) hours as needed.   chlorhexidine (PERIDEX) 0.12 % solution Use as directed 30 mLs in the mouth or throat 3 (three) times daily.   cholestyramine (QUESTRAN) 4 g packet Take 4 g by mouth at bedtime.   guaifenesin (ROBITUSSIN) 100 MG/5ML syrup Take 100 mg by mouth every 4 (four) hours as needed for cough.   hydrochlorothiazide (HYDRODIURIL) 25 MG tablet Take 1 tablet (25 mg total) by mouth daily.   loratadine (CLARITIN) 10 MG tablet Take 10 mg by mouth daily.   losartan (COZAAR) 100 MG tablet Take 100 mg by mouth daily.   memantine (NAMENDA) 10 MG tablet Take 10 mg by mouth 2 (two) times daily.   Multiple Vitamins-Minerals (MENS MULTI VITAMIN & MINERAL PO)    sertraline (ZOLOFT) 25 MG tablet Take 25 mg by mouth daily.   [  DISCONTINUED] NYSTATIN PO Place and dissolve 5ml buccally four times daily for remove dentures from mouth   No facility-administered encounter medications on file as of 08/29/2022.    Review of Systems  Immunization History  Administered Date(s) Administered   Covid-19, Mrna,Vaccine(Spikevax)84yrs and older 07/31/2022   Influenza, High Dose Seasonal PF 02/07/2022   Influenza, Seasonal, Injecte, Preservative Fre 02/16/2016   Influenza-Unspecified 02/09/2014, 02/21/2015   Moderna SARS-COV2 Booster Vaccination 09/19/2021   Moderna Sars-Covid-2 Vaccination 09/09/2020   PFIZER Comirnaty(Gray Top)Covid-19 Tri-Sucrose Vaccine 03/02/2022   Pfizer Covid-19  Vaccine Bivalent Booster 41yrs & up 01/12/2021   Pneumococcal Conjugate-13 05/19/2014   Pneumococcal Polysaccharide-23 04/23/2005   Td 04/23/2005   Tetanus 05/27/2015   Zoster, Live 05/28/2013   Pertinent  Health Maintenance Due  Topic Date Due   INFLUENZA VACCINE  11/22/2022      04/13/2020    8:00 AM 04/14/2020    1:00 AM 04/14/2020    8:00 AM 04/15/2020    3:00 AM 04/15/2020    7:56 AM  Fall Risk  (RETIRED) Patient Fall Risk Level High fall risk High fall risk High fall risk High fall risk High fall risk   Functional Status Survey:    Vitals:   08/29/22 0837 08/29/22 0853  BP: (!) 155/78 (!) 154/69  Pulse: 61   Resp: 18   Temp: (!) 97.3 F (36.3 C)   SpO2: 95%   Weight: 176 lb 12.8 oz (80.2 kg)   Height: 5\' 10"  (1.778 m)    Body mass index is 25.37 kg/m. Physical Exam Constitutional:      Comments: Lying in bed  Cardiovascular:     Rate and Rhythm: Normal rate.     Pulses: Normal pulses.  Pulmonary:     Effort: Pulmonary effort is normal.  Abdominal:     General: Abdomen is flat.     Palpations: Abdomen is soft.  Musculoskeletal:     Comments: No deformities, non-tender to palpation  Neurological:     Mental Status: He is alert. Mental status is at baseline. He is disoriented.     Labs reviewed: Recent Labs    04/05/22 0000 08/23/22 0000  NA 140 137  K 4.1 3.5  CL 105 104  CO2 25* 26*  BUN 27* 25*  CREATININE 1.2 1.1  CALCIUM 9.2 8.7   Recent Labs    04/05/22 0000 08/23/22 0000  AST 26 19  ALT 27 15  ALKPHOS 64 60  ALBUMIN 3.8 3.5   Recent Labs    04/05/22 0000 08/23/22 0000  WBC 7.7 7.1  NEUTROABS 4,304.00 3,749.00  HGB 13.7 12.4*  HCT 41 37*  PLT 242 229   Lab Results  Component Value Date   TSH 1.22 05/12/2013   No results found for: "HGBA1C" No results found for: "CHOL", "HDL", "LDLCALC", "LDLDIRECT", "TRIG", "CHOLHDL"  Significant Diagnostic Results in last 30 days:  No results  found.  Assessment/Plan Atherosclerosis of aorta (HCC)  Malnutrition of moderate degree (HCC), Chronic  Vascular dementia with behavior disturbance (HCC), Chronic  Chronic kidney disease (CKD) stage G3b/A2, moderately decreased glomerular filtration rate (GFR) between 30-44 mL/min/1.73 square meter and albuminuria creatinine ratio between 30-299 mg/g (HCC), Chronic  Closed fracture of left hip, sequela Paitent with history of left hip fracture concern for periprosthetic fracture. X-ray ordered. CKD stable at this time. Weight is stable at this time. Dementia is stable, however, he is having increasingly difficult time with ambulation. Continue tylenol Prn. Continue Norvasc 10 mg daily. Continue Lanetta Inch  for diarrhea. Continue HCTZ 25 mg. Continue Cozaar 100 mg daily. Electrolytes wnl on current meds. Continue Namenda daily.    Family/ staff Communication: none  Labs/tests ordered:  results from 5/2 outlined above.

## 2022-09-03 DIAGNOSIS — I700 Atherosclerosis of aorta: Secondary | ICD-10-CM | POA: Insufficient documentation

## 2022-09-20 ENCOUNTER — Telehealth: Payer: Self-pay

## 2022-09-20 DIAGNOSIS — M25552 Pain in left hip: Secondary | ICD-10-CM | POA: Diagnosis not present

## 2022-09-20 DIAGNOSIS — Z86010 Personal history of colonic polyps: Secondary | ICD-10-CM | POA: Diagnosis not present

## 2022-09-20 DIAGNOSIS — N183 Chronic kidney disease, stage 3 unspecified: Secondary | ICD-10-CM | POA: Diagnosis not present

## 2022-09-20 DIAGNOSIS — I129 Hypertensive chronic kidney disease with stage 1 through stage 4 chronic kidney disease, or unspecified chronic kidney disease: Secondary | ICD-10-CM | POA: Diagnosis not present

## 2022-09-20 DIAGNOSIS — R251 Tremor, unspecified: Secondary | ICD-10-CM | POA: Diagnosis not present

## 2022-09-20 DIAGNOSIS — Z96652 Presence of left artificial knee joint: Secondary | ICD-10-CM | POA: Diagnosis not present

## 2022-09-20 DIAGNOSIS — E440 Moderate protein-calorie malnutrition: Secondary | ICD-10-CM | POA: Diagnosis not present

## 2022-09-20 DIAGNOSIS — I700 Atherosclerosis of aorta: Secondary | ICD-10-CM | POA: Diagnosis not present

## 2022-09-20 DIAGNOSIS — Z8546 Personal history of malignant neoplasm of prostate: Secondary | ICD-10-CM | POA: Diagnosis not present

## 2022-09-20 DIAGNOSIS — F0390 Unspecified dementia without behavioral disturbance: Secondary | ICD-10-CM | POA: Diagnosis not present

## 2022-09-20 DIAGNOSIS — S7292XS Unspecified fracture of left femur, sequela: Secondary | ICD-10-CM | POA: Diagnosis not present

## 2022-09-20 DIAGNOSIS — Z9181 History of falling: Secondary | ICD-10-CM | POA: Diagnosis not present

## 2022-09-20 DIAGNOSIS — G4733 Obstructive sleep apnea (adult) (pediatric): Secondary | ICD-10-CM | POA: Diagnosis not present

## 2022-09-20 DIAGNOSIS — Z791 Long term (current) use of non-steroidal anti-inflammatories (NSAID): Secondary | ICD-10-CM | POA: Diagnosis not present

## 2022-09-20 DIAGNOSIS — Z5982 Transportation insecurity: Secondary | ICD-10-CM | POA: Diagnosis not present

## 2022-09-20 DIAGNOSIS — I456 Pre-excitation syndrome: Secondary | ICD-10-CM | POA: Diagnosis not present

## 2022-09-20 NOTE — Telephone Encounter (Signed)
Soni with [facility] called requesting verbal orders for patient for PT for 1 time a week for 7 weeks.  Romilda Garret [phone]   Message routed to Dr. Earnestine Mealing

## 2022-09-24 DIAGNOSIS — S7292XS Unspecified fracture of left femur, sequela: Secondary | ICD-10-CM | POA: Diagnosis not present

## 2022-09-24 DIAGNOSIS — R251 Tremor, unspecified: Secondary | ICD-10-CM | POA: Diagnosis not present

## 2022-09-24 DIAGNOSIS — I700 Atherosclerosis of aorta: Secondary | ICD-10-CM | POA: Diagnosis not present

## 2022-09-24 DIAGNOSIS — I456 Pre-excitation syndrome: Secondary | ICD-10-CM | POA: Diagnosis not present

## 2022-09-24 DIAGNOSIS — Z86010 Personal history of colonic polyps: Secondary | ICD-10-CM | POA: Diagnosis not present

## 2022-09-24 DIAGNOSIS — E440 Moderate protein-calorie malnutrition: Secondary | ICD-10-CM | POA: Diagnosis not present

## 2022-09-24 DIAGNOSIS — I129 Hypertensive chronic kidney disease with stage 1 through stage 4 chronic kidney disease, or unspecified chronic kidney disease: Secondary | ICD-10-CM | POA: Diagnosis not present

## 2022-09-24 DIAGNOSIS — Z5982 Transportation insecurity: Secondary | ICD-10-CM | POA: Diagnosis not present

## 2022-09-24 DIAGNOSIS — G4733 Obstructive sleep apnea (adult) (pediatric): Secondary | ICD-10-CM | POA: Diagnosis not present

## 2022-09-24 DIAGNOSIS — Z791 Long term (current) use of non-steroidal anti-inflammatories (NSAID): Secondary | ICD-10-CM | POA: Diagnosis not present

## 2022-09-24 DIAGNOSIS — M25552 Pain in left hip: Secondary | ICD-10-CM | POA: Diagnosis not present

## 2022-09-24 DIAGNOSIS — F0390 Unspecified dementia without behavioral disturbance: Secondary | ICD-10-CM | POA: Diagnosis not present

## 2022-09-24 DIAGNOSIS — N183 Chronic kidney disease, stage 3 unspecified: Secondary | ICD-10-CM | POA: Diagnosis not present

## 2022-09-24 DIAGNOSIS — Z9181 History of falling: Secondary | ICD-10-CM | POA: Diagnosis not present

## 2022-09-24 DIAGNOSIS — Z96652 Presence of left artificial knee joint: Secondary | ICD-10-CM | POA: Diagnosis not present

## 2022-09-24 DIAGNOSIS — Z8546 Personal history of malignant neoplasm of prostate: Secondary | ICD-10-CM | POA: Diagnosis not present

## 2022-10-03 DIAGNOSIS — Z8546 Personal history of malignant neoplasm of prostate: Secondary | ICD-10-CM | POA: Diagnosis not present

## 2022-10-03 DIAGNOSIS — Z96652 Presence of left artificial knee joint: Secondary | ICD-10-CM | POA: Diagnosis not present

## 2022-10-03 DIAGNOSIS — I456 Pre-excitation syndrome: Secondary | ICD-10-CM | POA: Diagnosis not present

## 2022-10-03 DIAGNOSIS — Z86010 Personal history of colonic polyps: Secondary | ICD-10-CM | POA: Diagnosis not present

## 2022-10-03 DIAGNOSIS — Z791 Long term (current) use of non-steroidal anti-inflammatories (NSAID): Secondary | ICD-10-CM | POA: Diagnosis not present

## 2022-10-03 DIAGNOSIS — I700 Atherosclerosis of aorta: Secondary | ICD-10-CM | POA: Diagnosis not present

## 2022-10-03 DIAGNOSIS — M25552 Pain in left hip: Secondary | ICD-10-CM | POA: Diagnosis not present

## 2022-10-03 DIAGNOSIS — F0390 Unspecified dementia without behavioral disturbance: Secondary | ICD-10-CM | POA: Diagnosis not present

## 2022-10-03 DIAGNOSIS — S7292XS Unspecified fracture of left femur, sequela: Secondary | ICD-10-CM | POA: Diagnosis not present

## 2022-10-03 DIAGNOSIS — Z9181 History of falling: Secondary | ICD-10-CM | POA: Diagnosis not present

## 2022-10-03 DIAGNOSIS — G4733 Obstructive sleep apnea (adult) (pediatric): Secondary | ICD-10-CM | POA: Diagnosis not present

## 2022-10-03 DIAGNOSIS — R251 Tremor, unspecified: Secondary | ICD-10-CM | POA: Diagnosis not present

## 2022-10-03 DIAGNOSIS — I129 Hypertensive chronic kidney disease with stage 1 through stage 4 chronic kidney disease, or unspecified chronic kidney disease: Secondary | ICD-10-CM | POA: Diagnosis not present

## 2022-10-03 DIAGNOSIS — N183 Chronic kidney disease, stage 3 unspecified: Secondary | ICD-10-CM | POA: Diagnosis not present

## 2022-10-03 DIAGNOSIS — E440 Moderate protein-calorie malnutrition: Secondary | ICD-10-CM | POA: Diagnosis not present

## 2022-10-03 DIAGNOSIS — Z5982 Transportation insecurity: Secondary | ICD-10-CM | POA: Diagnosis not present

## 2022-10-09 DIAGNOSIS — Z86010 Personal history of colonic polyps: Secondary | ICD-10-CM | POA: Diagnosis not present

## 2022-10-09 DIAGNOSIS — Z791 Long term (current) use of non-steroidal anti-inflammatories (NSAID): Secondary | ICD-10-CM | POA: Diagnosis not present

## 2022-10-09 DIAGNOSIS — N183 Chronic kidney disease, stage 3 unspecified: Secondary | ICD-10-CM | POA: Diagnosis not present

## 2022-10-09 DIAGNOSIS — Z96652 Presence of left artificial knee joint: Secondary | ICD-10-CM | POA: Diagnosis not present

## 2022-10-09 DIAGNOSIS — I129 Hypertensive chronic kidney disease with stage 1 through stage 4 chronic kidney disease, or unspecified chronic kidney disease: Secondary | ICD-10-CM | POA: Diagnosis not present

## 2022-10-09 DIAGNOSIS — F0390 Unspecified dementia without behavioral disturbance: Secondary | ICD-10-CM | POA: Diagnosis not present

## 2022-10-09 DIAGNOSIS — I456 Pre-excitation syndrome: Secondary | ICD-10-CM | POA: Diagnosis not present

## 2022-10-09 DIAGNOSIS — I700 Atherosclerosis of aorta: Secondary | ICD-10-CM | POA: Diagnosis not present

## 2022-10-09 DIAGNOSIS — R251 Tremor, unspecified: Secondary | ICD-10-CM | POA: Diagnosis not present

## 2022-10-09 DIAGNOSIS — Z8546 Personal history of malignant neoplasm of prostate: Secondary | ICD-10-CM | POA: Diagnosis not present

## 2022-10-09 DIAGNOSIS — G4733 Obstructive sleep apnea (adult) (pediatric): Secondary | ICD-10-CM | POA: Diagnosis not present

## 2022-10-09 DIAGNOSIS — Z9181 History of falling: Secondary | ICD-10-CM | POA: Diagnosis not present

## 2022-10-09 DIAGNOSIS — E440 Moderate protein-calorie malnutrition: Secondary | ICD-10-CM | POA: Diagnosis not present

## 2022-10-09 DIAGNOSIS — S7292XS Unspecified fracture of left femur, sequela: Secondary | ICD-10-CM | POA: Diagnosis not present

## 2022-10-09 DIAGNOSIS — Z5982 Transportation insecurity: Secondary | ICD-10-CM | POA: Diagnosis not present

## 2022-10-09 DIAGNOSIS — M25552 Pain in left hip: Secondary | ICD-10-CM | POA: Diagnosis not present

## 2022-10-15 DIAGNOSIS — I129 Hypertensive chronic kidney disease with stage 1 through stage 4 chronic kidney disease, or unspecified chronic kidney disease: Secondary | ICD-10-CM | POA: Diagnosis not present

## 2022-10-15 DIAGNOSIS — S7292XS Unspecified fracture of left femur, sequela: Secondary | ICD-10-CM | POA: Diagnosis not present

## 2022-10-15 DIAGNOSIS — R251 Tremor, unspecified: Secondary | ICD-10-CM | POA: Diagnosis not present

## 2022-10-15 DIAGNOSIS — Z5982 Transportation insecurity: Secondary | ICD-10-CM | POA: Diagnosis not present

## 2022-10-15 DIAGNOSIS — Z86010 Personal history of colonic polyps: Secondary | ICD-10-CM | POA: Diagnosis not present

## 2022-10-15 DIAGNOSIS — Z791 Long term (current) use of non-steroidal anti-inflammatories (NSAID): Secondary | ICD-10-CM | POA: Diagnosis not present

## 2022-10-15 DIAGNOSIS — M25552 Pain in left hip: Secondary | ICD-10-CM | POA: Diagnosis not present

## 2022-10-15 DIAGNOSIS — Z9181 History of falling: Secondary | ICD-10-CM | POA: Diagnosis not present

## 2022-10-15 DIAGNOSIS — I456 Pre-excitation syndrome: Secondary | ICD-10-CM | POA: Diagnosis not present

## 2022-10-15 DIAGNOSIS — G4733 Obstructive sleep apnea (adult) (pediatric): Secondary | ICD-10-CM | POA: Diagnosis not present

## 2022-10-15 DIAGNOSIS — F0390 Unspecified dementia without behavioral disturbance: Secondary | ICD-10-CM | POA: Diagnosis not present

## 2022-10-15 DIAGNOSIS — Z96652 Presence of left artificial knee joint: Secondary | ICD-10-CM | POA: Diagnosis not present

## 2022-10-15 DIAGNOSIS — N183 Chronic kidney disease, stage 3 unspecified: Secondary | ICD-10-CM | POA: Diagnosis not present

## 2022-10-15 DIAGNOSIS — I700 Atherosclerosis of aorta: Secondary | ICD-10-CM | POA: Diagnosis not present

## 2022-10-15 DIAGNOSIS — E440 Moderate protein-calorie malnutrition: Secondary | ICD-10-CM | POA: Diagnosis not present

## 2022-10-15 DIAGNOSIS — Z8546 Personal history of malignant neoplasm of prostate: Secondary | ICD-10-CM | POA: Diagnosis not present

## 2022-10-17 ENCOUNTER — Non-Acute Institutional Stay: Payer: Medicare Other | Admitting: Student

## 2022-10-17 ENCOUNTER — Encounter: Payer: Self-pay | Admitting: Student

## 2022-10-17 DIAGNOSIS — Z66 Do not resuscitate: Secondary | ICD-10-CM

## 2022-10-17 DIAGNOSIS — N1832 Chronic kidney disease, stage 3b: Secondary | ICD-10-CM | POA: Diagnosis not present

## 2022-10-17 DIAGNOSIS — I10 Essential (primary) hypertension: Secondary | ICD-10-CM | POA: Diagnosis not present

## 2022-10-17 DIAGNOSIS — F01B0 Vascular dementia, moderate, without behavioral disturbance, psychotic disturbance, mood disturbance, and anxiety: Secondary | ICD-10-CM | POA: Diagnosis not present

## 2022-10-17 NOTE — Progress Notes (Unsigned)
Location:  Other [facility].  Nursing Home Room Number: [facility] [address]A Place of Service:  ALF [phone]) Provider:  Earnestine Mealing, MD  Patient Care Team: Earnestine Mealing, MD as PCP - General [facility] Medicine)  Extended Emergency Contact Information Primary Emergency Contact: Smith,Kendal Address: [address]          [place], [place] [address] Darden Amber of [place] Home Phone: [phone] Mobile Phone: [phone] Relation: Daughter  Code Status:  DNR Goals of care: Advanced Directive information    10/17/2022    8:52 AM  Advanced Directives  Does Patient Have a Medical Advance Directive? Yes  Type of Estate agent of [place];Out of facility DNR (pink MOST or yellow form);Living will  Does patient want to make changes to medical advance directive? No - Patient declined  Copy of Healthcare Power of Attorney in Chart? Yes - validated most recent copy scanned in chart (See row information)     Chief Complaint  Patient presents with   Medical Management of Chronic Issues    Medical Management of Chronic Issues.     HPI:  Pt is a 89 y.o. male seen today for medical management of chronic diseases.  Paitent is sitting on his bed with shower water running. He showers independently. He says he has been looking for his wife. He has no concerns at this time. He says the only thing bothering him is me, and smiles.   Denies chest pain, shortness of breath, problems with urination or constipation.   He states he is from [place] but he lives in [place] now. He says he is 89 years old, but can't give the year he was born. Gives his name and birthday.   He has a daughter.   Nursing concern regarding his walking, in physical therapy.      Past Medical History:  Diagnosis Date   History of prostate cancer    RT 2006   Hx of colonic polyps    Hypertension    Mild cognitive impairment    OSA (obstructive sleep apnea)    on CPAP of 10    Rosacea    Tremor, essential    Wolff-Parkinson-White syndrome 1970   Past Surgical History:  Procedure Laterality Date   INTRAMEDULLARY (IM) NAIL INTERTROCHANTERIC Left 04/12/2020   Procedure: INTRAMEDULLARY (IM) NAIL INTERTROCHANTRIC;  Surgeon: Lyndle Herrlich, MD;  Location: [facility] ORS;  Service: Orthopedics;  Laterality: Left;   TONSILLECTOMY     TOTAL KNEE ARTHROPLASTY Left 3/14    Allergies  Allergen Reactions   Lisinopril     REACTION: cough   Lactose Intolerance (Gi) Diarrhea    Outpatient Encounter Medications as of 10/17/2022  Medication Sig   acetaminophen (TYLENOL) 325 MG tablet Take 650 mg by mouth 3 (three) times daily.   acetaminophen (TYLENOL) 500 MG tablet Take 500 mg by mouth every 12 (twelve) hours as needed.   amLODipine (NORVASC) 10 MG tablet TAKE 1 TABLET BY MOUTH EVERY DAY   Benzocaine-Menthol (ORAJEL 2X TOOTHACHE & GUM) 20-0.26 % GEL Use as directed 1 application  in the mouth or throat every 2 (two) hours as needed.   chlorhexidine (PERIDEX) 0.12 % solution Use as directed 30 mLs in the mouth or throat 3 (three) times daily.   cholestyramine (QUESTRAN) 4 g packet Take 4 g by mouth at bedtime.   guaifenesin (ROBITUSSIN) 100 MG/5ML syrup Take 100 mg by mouth every 4 (four) hours as needed for cough.   hydrochlorothiazide (HYDRODIURIL) 25 MG  tablet Take 1 tablet (25 mg total) by mouth daily.   loratadine (CLARITIN) 10 MG tablet Take 10 mg by mouth daily.   losartan (COZAAR) 100 MG tablet Take 100 mg by mouth daily.   memantine (NAMENDA) 10 MG tablet Take 10 mg by mouth 2 (two) times daily.   Multiple Vitamins-Minerals (MENS MULTI VITAMIN & MINERAL PO)    sertraline (ZOLOFT) 25 MG tablet Take 25 mg by mouth daily.   No facility-administered encounter medications on file as of 10/17/2022.    Review of Systems  Immunization History  Administered Date(s) Administered   Covid-19, Mrna,Vaccine(Spikevax)50yrs and older 07/31/2022   Influenza, High Dose Seasonal  PF 02/07/2022   Influenza, Seasonal, Injecte, Preservative Fre 02/16/2016   Influenza-Unspecified 02/09/2014, 02/21/2015   Moderna SARS-COV2 Booster Vaccination 09/19/2021   Moderna Sars-Covid-2 Vaccination 09/09/2020   PFIZER Comirnaty(Gray Top)Covid-19 Tri-Sucrose Vaccine 03/02/2022   Pfizer Covid-19 Vaccine Bivalent Booster 25yrs & up 01/12/2021   Pneumococcal Conjugate-13 05/19/2014   Pneumococcal Polysaccharide-23 04/23/2005   Td 04/23/2005   Tetanus 05/27/2015   Zoster Recombinat (Shingrix) 08/13/2022   Zoster, Live 05/28/2013   Pertinent  Health Maintenance Due  Topic Date Due   INFLUENZA VACCINE  11/22/2022      04/13/2020    8:00 AM 04/14/2020    1:00 AM 04/14/2020    8:00 AM 04/15/2020    3:00 AM 04/15/2020    7:56 AM  Fall Risk  (RETIRED) Patient Fall Risk Level High fall risk High fall risk High fall risk High fall risk High fall risk   Functional Status Survey:    Vitals:   10/17/22 0836 10/17/22 0854  BP: (!) 143/65 137/77  Pulse: 60   Resp: 16   Temp: (!) 97.2 F (36.2 C)   SpO2: 97%   Weight: 177 lb 12.8 oz (80.6 kg)   Height: 5\' 10"  (1.778 m)    Body mass index is 25.51 kg/m. Physical Exam Constitutional:      Appearance: Normal appearance.  Cardiovascular:     Rate and Rhythm: Normal rate.     Pulses: Normal pulses.  Pulmonary:     Effort: Pulmonary effort is normal.  Abdominal:     General: Abdomen is flat.     Palpations: Abdomen is soft.  Skin:    Comments: Numerous Sks of chest and back  Neurological:     General: No focal deficit present.     Mental Status: He is alert.     Labs reviewed: Recent Labs    04/05/22 0000 08/23/22 0000  NA 140 137  K 4.1 3.5  CL 105 104  CO2 25* 26*  BUN 27* 25*  CREATININE 1.2 1.1  CALCIUM 9.2 8.7   Recent Labs    04/05/22 0000 08/23/22 0000  AST 26 19  ALT 27 15  ALKPHOS 64 60  ALBUMIN 3.8 3.5   Recent Labs    04/05/22 0000 08/23/22 0000  WBC 7.7 7.1  NEUTROABS 4,304.00  3,749.00  HGB 13.7 12.4*  HCT 41 37*  PLT 242 229   Lab Results  Component Value Date   TSH 1.22 05/12/2013   No results found for: "HGBA1C" No results found for: "CHOL", "HDL", "LDLCALC", "LDLDIRECT", "TRIG", "CHOLHDL"  Significant Diagnostic Results in last 30 days:  No results found.  Assessment/Plan 1. Chronic kidney disease (CKD) stage G3b/A2, moderately decreased glomerular filtration rate (GFR) between 30-44 mL/min/1.73 square meter and albuminuria creatinine ratio between 30-299 mg/g (HCC) Most recetn gfr within normal range. Continue to  monitor function q6 mo.   2. Moderate vascular dementia without behavioral disturbance, psychotic disturbance, mood disturbance, or anxiety (HCC) Patient remains at memory level of care. Disoriented at baseline. Ambulates with a rollator walker. Has had 1 fall in the last 3 months w/o injury. Eats well, weight is stable. Continue sertraline and namenda. Periodic outbursts and times where he is difficult to calm down, no medications at this time.    3. Essential hypertension, benign BP well-controlled, continue current regimen. Continue norvasc, cozaar.   4. DNR (do not resuscitate) Patient has a DNR in place   Family/ staff Communication: nursing  Labs/tests ordered:  q19mo BMP and CBC

## 2022-10-23 DIAGNOSIS — I456 Pre-excitation syndrome: Secondary | ICD-10-CM | POA: Diagnosis not present

## 2022-10-23 DIAGNOSIS — Z96652 Presence of left artificial knee joint: Secondary | ICD-10-CM | POA: Diagnosis not present

## 2022-10-23 DIAGNOSIS — I700 Atherosclerosis of aorta: Secondary | ICD-10-CM | POA: Diagnosis not present

## 2022-10-23 DIAGNOSIS — Z86010 Personal history of colonic polyps: Secondary | ICD-10-CM | POA: Diagnosis not present

## 2022-10-23 DIAGNOSIS — Z8546 Personal history of malignant neoplasm of prostate: Secondary | ICD-10-CM | POA: Diagnosis not present

## 2022-10-23 DIAGNOSIS — S7292XS Unspecified fracture of left femur, sequela: Secondary | ICD-10-CM | POA: Diagnosis not present

## 2022-10-23 DIAGNOSIS — R251 Tremor, unspecified: Secondary | ICD-10-CM | POA: Diagnosis not present

## 2022-10-23 DIAGNOSIS — M25552 Pain in left hip: Secondary | ICD-10-CM | POA: Diagnosis not present

## 2022-10-23 DIAGNOSIS — F0390 Unspecified dementia without behavioral disturbance: Secondary | ICD-10-CM | POA: Diagnosis not present

## 2022-10-23 DIAGNOSIS — G4733 Obstructive sleep apnea (adult) (pediatric): Secondary | ICD-10-CM | POA: Diagnosis not present

## 2022-10-23 DIAGNOSIS — Z5982 Transportation insecurity: Secondary | ICD-10-CM | POA: Diagnosis not present

## 2022-10-23 DIAGNOSIS — I129 Hypertensive chronic kidney disease with stage 1 through stage 4 chronic kidney disease, or unspecified chronic kidney disease: Secondary | ICD-10-CM | POA: Diagnosis not present

## 2022-10-23 DIAGNOSIS — Z791 Long term (current) use of non-steroidal anti-inflammatories (NSAID): Secondary | ICD-10-CM | POA: Diagnosis not present

## 2022-10-23 DIAGNOSIS — Z9181 History of falling: Secondary | ICD-10-CM | POA: Diagnosis not present

## 2022-10-23 DIAGNOSIS — N183 Chronic kidney disease, stage 3 unspecified: Secondary | ICD-10-CM | POA: Diagnosis not present

## 2022-10-23 DIAGNOSIS — E440 Moderate protein-calorie malnutrition: Secondary | ICD-10-CM | POA: Diagnosis not present

## 2022-11-02 DIAGNOSIS — Z791 Long term (current) use of non-steroidal anti-inflammatories (NSAID): Secondary | ICD-10-CM | POA: Diagnosis not present

## 2022-11-02 DIAGNOSIS — I456 Pre-excitation syndrome: Secondary | ICD-10-CM | POA: Diagnosis not present

## 2022-11-02 DIAGNOSIS — F0390 Unspecified dementia without behavioral disturbance: Secondary | ICD-10-CM | POA: Diagnosis not present

## 2022-11-02 DIAGNOSIS — E440 Moderate protein-calorie malnutrition: Secondary | ICD-10-CM | POA: Diagnosis not present

## 2022-11-02 DIAGNOSIS — Z96652 Presence of left artificial knee joint: Secondary | ICD-10-CM | POA: Diagnosis not present

## 2022-11-02 DIAGNOSIS — S7292XS Unspecified fracture of left femur, sequela: Secondary | ICD-10-CM | POA: Diagnosis not present

## 2022-11-02 DIAGNOSIS — I700 Atherosclerosis of aorta: Secondary | ICD-10-CM | POA: Diagnosis not present

## 2022-11-02 DIAGNOSIS — G4733 Obstructive sleep apnea (adult) (pediatric): Secondary | ICD-10-CM | POA: Diagnosis not present

## 2022-11-02 DIAGNOSIS — R251 Tremor, unspecified: Secondary | ICD-10-CM | POA: Diagnosis not present

## 2022-11-02 DIAGNOSIS — Z5982 Transportation insecurity: Secondary | ICD-10-CM | POA: Diagnosis not present

## 2022-11-02 DIAGNOSIS — M25552 Pain in left hip: Secondary | ICD-10-CM | POA: Diagnosis not present

## 2022-11-02 DIAGNOSIS — I129 Hypertensive chronic kidney disease with stage 1 through stage 4 chronic kidney disease, or unspecified chronic kidney disease: Secondary | ICD-10-CM | POA: Diagnosis not present

## 2022-11-02 DIAGNOSIS — N183 Chronic kidney disease, stage 3 unspecified: Secondary | ICD-10-CM | POA: Diagnosis not present

## 2022-11-02 DIAGNOSIS — Z9181 History of falling: Secondary | ICD-10-CM | POA: Diagnosis not present

## 2022-11-02 DIAGNOSIS — Z86010 Personal history of colonic polyps: Secondary | ICD-10-CM | POA: Diagnosis not present

## 2022-11-02 DIAGNOSIS — Z8546 Personal history of malignant neoplasm of prostate: Secondary | ICD-10-CM | POA: Diagnosis not present

## 2022-12-04 ENCOUNTER — Encounter: Payer: Self-pay | Admitting: Nurse Practitioner

## 2022-12-04 ENCOUNTER — Non-Acute Institutional Stay: Payer: Self-pay | Admitting: Nurse Practitioner

## 2022-12-04 DIAGNOSIS — F01518 Vascular dementia, unspecified severity, with other behavioral disturbance: Secondary | ICD-10-CM | POA: Diagnosis not present

## 2022-12-04 DIAGNOSIS — F39 Unspecified mood [affective] disorder: Secondary | ICD-10-CM | POA: Diagnosis not present

## 2022-12-04 NOTE — Progress Notes (Signed)
Location:  Other Nursing Home Room Number: [facility] [address]A Place of Service:  ALF [phone])  Michael Kane, [place], MD  Patient Care Team: Michael Mealing, MD as PCP - General [facility] Medicine)  Extended Emergency Contact Information Primary Emergency Contact: Kane,Michael Address: [address]          [place], [place] [address] Michael Kane of [place] Home Phone: [phone] Mobile Phone: [phone] Relation: Daughter  Goals of care: Advanced Directive information    12/04/2022   12:48 PM  Advanced Directives  Does Patient Have a Medical Advance Directive? Yes  Type of Estate agent of [place];Out of facility DNR (pink MOST or yellow form);Living will  Does patient want to make changes to medical advance directive? No - Patient declined  Copy of Healthcare Power of Attorney in Chart? Yes - validated most recent copy scanned in chart (See row information)     Chief Complaint  Patient presents with   Acute Visit    Anxiety, Resisting Care.     HPI:  Pt is a 89 y.o. male seen today for an acute visit for increase in anxiety and agitation.  Pt with hx of dementia and does not wish for the staff to help him with personal care. Will want to stay in a soiled breif and gets aggressive and resistant when help is needed from staff. When asking patient questions today during visit he got very agitated with questions and stated to leave him alone.     Past Medical History:  Diagnosis Date   History of prostate cancer    RT 2006   Hx of colonic polyps    Hypertension    Mild cognitive impairment    OSA (obstructive sleep apnea)    on CPAP of 10   Rosacea    Tremor, essential    Wolff-Parkinson-White syndrome 1970   Past Surgical History:  Procedure Laterality Date   INTRAMEDULLARY (IM) NAIL INTERTROCHANTERIC Left 04/12/2020   Procedure: INTRAMEDULLARY (IM) NAIL INTERTROCHANTRIC;  Surgeon: Lyndle Herrlich, MD;  Location: [facility] ORS;  Service: Orthopedics;   Laterality: Left;   TONSILLECTOMY     TOTAL KNEE ARTHROPLASTY Left 3/14    Allergies  Allergen Reactions   Lisinopril     REACTION: cough   Lactose Intolerance (Gi) Diarrhea    Outpatient Encounter Medications as of 12/04/2022  Medication Sig   acetaminophen (TYLENOL) 500 MG tablet Take 500 mg by mouth every 12 (twelve) hours as needed. Give Two tablets by mouth three times daily for pain in Right Hip and Leg   amLODipine (NORVASC) 10 MG tablet TAKE 1 TABLET BY MOUTH EVERY DAY   Benzocaine-Menthol (ORAJEL 2X TOOTHACHE & GUM) 20-0.26 % GEL Use as directed 1 application  in the mouth or throat every 2 (two) hours as needed.   chlorhexidine (PERIDEX) 0.12 % solution Use as directed 30 mLs in the mouth or throat 3 (three) times daily.   cholestyramine (QUESTRAN) 4 g packet Take 4 g by mouth at bedtime.   guaifenesin (ROBITUSSIN) 100 MG/5ML syrup Take 100 mg by mouth every 4 (four) hours as needed for cough.   hydrochlorothiazide (HYDRODIURIL) 25 MG tablet Take 1 tablet (25 mg total) by mouth daily.   loratadine (CLARITIN) 10 MG tablet Take 10 mg by mouth daily.   losartan (COZAAR) 100 MG tablet Take 100 mg by mouth daily.   memantine (NAMENDA) 10 MG tablet Take 10 mg by mouth 2 (two) times daily.   Multiple Vitamins-Minerals (MENS MULTI VITAMIN &  MINERAL PO)    sertraline (ZOLOFT) 25 MG tablet Take 25 mg by mouth daily.   acetaminophen (TYLENOL) 325 MG tablet Take 650 mg by mouth 3 (three) times daily.   No facility-administered encounter medications on file as of 12/04/2022.    Review of Systems  Unable to perform ROS: Dementia    Immunization History  Administered Date(s) Administered   Covid-19, Mrna,Vaccine(Spikevax)53yrs and older 07/31/2022   Influenza, High Dose Seasonal PF 02/07/2022   Influenza, Seasonal, Injecte, Preservative Fre 02/16/2016   Influenza-Unspecified 02/09/2014, 02/21/2015   Moderna SARS-COV2 Booster Vaccination 09/19/2021   Moderna Sars-Covid-2  Vaccination 09/09/2020, 03/02/2022   PFIZER Comirnaty(Gray Top)Covid-19 Tri-Sucrose Vaccine 03/02/2022   Pfizer Covid-19 Vaccine Bivalent Booster 42yrs & up 01/12/2021   Pneumococcal Conjugate-13 05/19/2014   Pneumococcal Polysaccharide-23 04/23/2005   Td 04/23/2005   Tetanus 05/27/2015   Zoster Recombinant(Shingrix) 08/13/2022, 11/13/2022   Zoster, Live 05/28/2013   Pertinent  Health Maintenance Due  Topic Date Due   INFLUENZA VACCINE  11/22/2022      04/14/2020    1:00 AM 04/14/2020    8:00 AM 04/15/2020    3:00 AM 04/15/2020    7:56 AM 10/18/2022   10:13 AM  Fall Risk  Falls in the past year?     1  Was there an injury with Fall?     0  Fall Risk Category Calculator     2  (RETIRED) Patient Fall Risk Level High fall risk High fall risk High fall risk High fall risk   Patient at Risk for Falls Due to     History of fall(s);Impaired mobility;Impaired vision  Fall risk Follow up     Falls evaluation completed   Functional Status Survey:    Vitals:   12/04/22 1242 12/04/22 1250  BP: (!) 150/71 (!) 148/77  Pulse: (!) 58   Resp: 16   Temp: (!) 96.9 F (36.1 C)   SpO2: 96%   Weight: 163 lb 12.8 oz (74.3 kg)   Height: 5\' 10"  (1.778 m)    Body mass index is 23.5 kg/m. Physical Exam Constitutional:      Appearance: Normal appearance.  Skin:    General: Skin is warm.     Findings: Bruising present.  Neurological:     Mental Status: He is alert. Mental status is at baseline.  Psychiatric:        Mood and Affect: Affect is labile.        Behavior: Behavior is agitated.        Cognition and Memory: Cognition is impaired. Memory is impaired. He exhibits impaired recent memory and impaired remote memory.     Labs reviewed: Recent Labs    04/05/22 0000 08/23/22 0000  NA 140 137  K 4.1 3.5  CL 105 104  CO2 25* 26*  BUN 27* 25*  CREATININE 1.2 1.1  CALCIUM 9.2 8.7   Recent Labs    04/05/22 0000 08/23/22 0000  AST 26 19  ALT 27 15  ALKPHOS 64 60  ALBUMIN  3.8 3.5   Recent Labs    04/05/22 0000 08/23/22 0000  WBC 7.7 7.1  NEUTROABS 4,304.00 3,749.00  HGB 13.7 12.4*  HCT 41 37*  PLT 242 229   Lab Results  Component Value Date   TSH 1.22 05/12/2013   No results found for: "HGBA1C" No results found for: "CHOL", "HDL", "LDLCALC", "LDLDIRECT", "TRIG", "CHOLHDL"  Significant Diagnostic Results in last 30 days:  No results found.  Assessment/Plan 1. Vascular dementia with  behavior disturbance (HCC) Continue supportive care with staff. Pt is high fall risk so need to be cautious of medications that will increase falls however with increase in anxiety and agitation   2. Mood disorder (HCC) -will increase zoloft to 50 mg at this time for 1 week then increase to 100 mg daily for mood.   Janene Harvey. Biag[facility] [phone]

## 2023-01-03 ENCOUNTER — Encounter: Payer: Self-pay | Admitting: Nurse Practitioner

## 2023-01-03 ENCOUNTER — Non-Acute Institutional Stay: Payer: Medicare Other | Admitting: Nurse Practitioner

## 2023-01-03 DIAGNOSIS — N1832 Chronic kidney disease, stage 3b: Secondary | ICD-10-CM | POA: Diagnosis not present

## 2023-01-03 DIAGNOSIS — F39 Unspecified mood [affective] disorder: Secondary | ICD-10-CM

## 2023-01-03 DIAGNOSIS — I10 Essential (primary) hypertension: Secondary | ICD-10-CM

## 2023-01-03 DIAGNOSIS — F01518 Vascular dementia, unspecified severity, with other behavioral disturbance: Secondary | ICD-10-CM | POA: Diagnosis not present

## 2023-01-03 NOTE — Progress Notes (Signed)
Location:  Other [facility] [place].  Nursing Home Room Number: [facility] 203A Place of Service:  ALF [phone]) Abbey Chatters, NP  PCP: Earnestine Mealing, MD  Patient Care Team: Earnestine Mealing, MD as PCP - General [facility] Medicine)  Extended Emergency Contact Information Primary Emergency Contact: Kane,Michael Address: [address]          [place], [place] [address] Darden [place] of [place] Home Phone: [phone] Mobile Phone: [phone] Relation: Daughter  Goals of care: Advanced Directive information    01/03/2023    8:55 AM  Advanced Directives  Does Patient Have a Medical Advance Directive? Yes  Type of Estate agent of [place];Out of facility DNR (pink MOST or yellow form);Living will  Does patient want to make changes to medical advance directive? No - Patient declined  Copy of Healthcare Power of Attorney in Chart? Yes - validated most recent copy scanned in chart (See row information)     Chief Complaint  Patient presents with   Medical Management of Chronic Issues    Medical Management of Chronic Issues.     HPI:  Pt is a 89 y.o. male seen today for medical management of chronic disease.  Pt with hx of dementia, anxiety, CKD, OA, HTN,  Staff reports pts mood and behaviors have improved with increase in zoloft.  No signs of pain.  Blood pressures elevated but have been hard to control in the past.  Eating well. Weight stable on last check.  Nursing without acute concerns.    Past Medical History:  Diagnosis Date   History of prostate cancer    RT 2006   Hx of colonic polyps    Hypertension    Mild cognitive impairment    OSA (obstructive sleep apnea)    on CPAP of 10   Rosacea    Tremor, essential    Wolff-Parkinson-White syndrome 1970   Past Surgical History:  Procedure Laterality Date   INTRAMEDULLARY (IM) NAIL INTERTROCHANTERIC Left 04/12/2020   Procedure: INTRAMEDULLARY (IM) NAIL INTERTROCHANTRIC;  Surgeon: Lyndle Herrlich, MD;  Location: [facility] ORS;  Service: Orthopedics;  Laterality: Left;   TONSILLECTOMY     TOTAL KNEE ARTHROPLASTY Left 3/14    Allergies  Allergen Reactions   Lisinopril     REACTION: cough   Lactose Intolerance (Gi) Diarrhea    Outpatient Encounter Medications as of 01/03/2023  Medication Sig   acetaminophen (TYLENOL) 325 MG tablet Take 650 mg by mouth 3 (three) times daily.   acetaminophen (TYLENOL) 500 MG tablet Take 500 mg by mouth every 12 (twelve) hours as needed. Give Two tablets by mouth three times daily for pain in Right Hip and Leg   amLODipine (NORVASC) 10 MG tablet TAKE 1 TABLET BY MOUTH EVERY DAY   Benzocaine-Menthol (ORAJEL 2X TOOTHACHE & GUM) 20-0.26 % GEL Use as directed 1 application  in the mouth or throat every 2 (two) hours as needed.   chlorhexidine (PERIDEX) 0.12 % solution Use as directed 30 mLs in the mouth or throat 3 (three) times daily.   cholestyramine (QUESTRAN) 4 g packet Take 4 g by mouth at bedtime.   guaifenesin (ROBITUSSIN) 100 MG/5ML syrup Take 100 mg by mouth every 4 (four) hours as needed for cough.   hydrochlorothiazide (HYDRODIURIL) 25 MG tablet Take 1 tablet (25 mg total) by mouth daily.   loratadine (CLARITIN) 10 MG tablet Take 10 mg by mouth daily.   losartan (COZAAR) 100 MG tablet Take 100 mg by mouth daily.   memantine (  NAMENDA) 10 MG tablet Take 10 mg by mouth 2 (two) times daily.   Multiple Vitamins-Minerals (MENS MULTI VITAMIN & MINERAL PO)    sertraline (ZOLOFT) 100 MG tablet Take 100 mg by mouth daily.   [DISCONTINUED] sertraline (ZOLOFT) 25 MG tablet Take 25 mg by mouth daily.   No facility-administered encounter medications on file as of 01/03/2023.    Review of Systems  Unable to perform ROS: Dementia     Immunization History  Administered Date(s) Administered   Covid-19, Mrna,Vaccine(Spikevax)55yrs and older 07/31/2022   Influenza, High Dose Seasonal PF 02/07/2022   Influenza, Seasonal, Injecte, Preservative Fre 02/16/2016    Influenza-Unspecified 02/09/2014, 02/21/2015   Moderna SARS-COV2 Booster Vaccination 09/19/2021   Moderna Sars-Covid-2 Vaccination 09/09/2020, 03/02/2022   PFIZER Comirnaty(Gray Top)Covid-19 Tri-Sucrose Vaccine 03/02/2022   Pfizer Covid-19 Vaccine Bivalent Booster 68yrs & up 01/12/2021   Pneumococcal Conjugate-13 05/19/2014   Pneumococcal Polysaccharide-23 04/23/2005   Td 04/23/2005   Tetanus 05/27/2015   Zoster Recombinant(Shingrix) 08/13/2022, 11/13/2022   Zoster, Live 05/28/2013   Pertinent  Health Maintenance Due  Topic Date Due   INFLUENZA VACCINE  11/22/2022      04/14/2020    1:00 AM 04/14/2020    8:00 AM 04/15/2020    3:00 AM 04/15/2020    7:56 AM 10/18/2022   10:13 AM  Fall Risk  Falls in the past year?     1  Was there an injury with Fall?     0  Fall Risk Category Calculator     2  (RETIRED) Patient Fall Risk Level High fall risk High fall risk High fall risk High fall risk   Patient at Risk for Falls Due to     History of fall(s);Impaired mobility;Impaired vision  Fall risk Follow up     Falls evaluation completed   Functional Status Survey:    Vitals:   01/03/23 0850 01/03/23 0856  BP: (!) 154/68 (!) 155/70  Pulse: (!) 58   Resp: 17   Temp: 97.8 F (36.6 C)   SpO2: 95%   Weight: 170 lb 6.4 oz (77.3 kg)   Height: 5\' 10"  (1.778 m)    Wt Readings from Last 3 Encounters:  01/03/23 170 lb 6.4 oz (77.3 kg)  12/04/22 163 lb 12.8 oz (74.3 kg)  10/17/22 177 lb 12.8 oz (80.6 kg)    Body mass index is 24.45 kg/m. Physical Exam Constitutional:      General: He is not in acute distress.    Appearance: He is well-developed. He is not diaphoretic.  HENT:     Head: Normocephalic and atraumatic.     Right Ear: External ear normal.     Left Ear: External ear normal.     Mouth/Throat:     Pharynx: No oropharyngeal exudate.  Eyes:     Conjunctiva/sclera: Conjunctivae normal.     Pupils: Pupils are equal, round, and reactive to light.  Cardiovascular:     Rate  and Rhythm: Normal rate and regular rhythm.     Heart sounds: Normal heart sounds.  Pulmonary:     Effort: Pulmonary effort is normal.     Breath sounds: Normal breath sounds.  Abdominal:     General: Bowel sounds are normal.     Palpations: Abdomen is soft.  Musculoskeletal:        General: No tenderness.     Cervical back: Normal range of motion and neck supple.     Right lower leg: No edema.     Left lower  leg: No edema.  Skin:    General: Skin is warm and dry.  Neurological:     Mental Status: He is alert. Mental status is at baseline. He is disoriented.     Labs reviewed: Recent Labs    04/05/22 0000 08/23/22 0000  NA 140 137  K 4.1 3.5  CL 105 104  CO2 25* 26*  BUN 27* 25*  CREATININE 1.2 1.1  CALCIUM 9.2 8.7   Recent Labs    04/05/22 0000 08/23/22 0000  AST 26 19  ALT 27 15  ALKPHOS 64 60  ALBUMIN 3.8 3.5   Recent Labs    04/05/22 0000 08/23/22 0000  WBC 7.7 7.1  NEUTROABS 4,304.00 3,749.00  HGB 13.7 12.4*  HCT 41 37*  PLT 242 229   Lab Results  Component Value Date   TSH 1.22 05/12/2013   No results found for: "HGBA1C" No results found for: "CHOL", "HDL", "LDLCALC", "LDLDIRECT", "TRIG", "CHOLHDL"  Significant Diagnostic Results in last 30 days:  No results found.  Assessment/Plan 1. Chronic kidney disease (CKD) stage G3b/A2, moderately decreased glomerular filtration rate (GFR) between 30-44 mL/min/1.73 square meter and albuminuria creatinine ratio between 30-299 mg/g (HCC) -Chronic and stable Encourage proper hydration Follow metabolic panel Avoid nephrotoxic meds (NSAIDS)  2. Vascular dementia with behavior disturbance (HCC) -Stable, no acute changes in cognitive or functional status, continue supportive care.   3. Essential hypertension, benign -slightly elevated however on multiple medications with slight bradycardia. Given age will continue current regimen   4. Mood disorder (HCC) Overall improved with increase in zoloft,  continue zoloft 100 mg daily     Michael Kane K. Biagio Borg [facility] [phone]

## 2023-02-22 DIAGNOSIS — K080 Exfoliation of teeth due to systemic causes: Secondary | ICD-10-CM | POA: Diagnosis not present

## 2023-02-25 DIAGNOSIS — R0782 Intercostal pain: Secondary | ICD-10-CM | POA: Diagnosis not present

## 2023-02-25 DIAGNOSIS — M545 Low back pain, unspecified: Secondary | ICD-10-CM | POA: Diagnosis not present

## 2023-02-27 DIAGNOSIS — I10 Essential (primary) hypertension: Secondary | ICD-10-CM | POA: Diagnosis not present

## 2023-02-27 DIAGNOSIS — F0390 Unspecified dementia without behavioral disturbance: Secondary | ICD-10-CM | POA: Diagnosis not present

## 2023-02-27 LAB — BASIC METABOLIC PANEL
BUN: 25 — AB (ref 4–21)
CO2: 27 — AB (ref 13–22)
Chloride: 102 (ref 99–108)
Creatinine: 1 (ref 0.6–1.3)
Glucose: 90
Potassium: 3.4 meq/L — AB (ref 3.5–5.1)
Sodium: 138 (ref 137–147)

## 2023-02-27 LAB — CBC AND DIFFERENTIAL
HCT: 40 — AB (ref 41–53)
Hemoglobin: 13 — AB (ref 13.5–17.5)
Neutrophils Absolute: 5315
WBC: 8.8

## 2023-02-27 LAB — COMPREHENSIVE METABOLIC PANEL
Albumin: 3.9 (ref 3.5–5.0)
Calcium: 9.2 (ref 8.7–10.7)
Globulin: 2.4
eGFR: 70

## 2023-02-27 LAB — CBC: RBC: 4.17 (ref 3.87–5.11)

## 2023-02-27 LAB — HEPATIC FUNCTION PANEL
ALT: 19 U/L (ref 10–40)
AST: 23 (ref 14–40)
Alkaline Phosphatase: 84 (ref 25–125)

## 2023-04-01 ENCOUNTER — Non-Acute Institutional Stay: Payer: Self-pay | Admitting: Student

## 2023-04-01 ENCOUNTER — Encounter: Payer: Self-pay | Admitting: Student

## 2023-04-01 DIAGNOSIS — F01518 Vascular dementia, unspecified severity, with other behavioral disturbance: Secondary | ICD-10-CM

## 2023-04-01 DIAGNOSIS — I10 Essential (primary) hypertension: Secondary | ICD-10-CM

## 2023-04-01 DIAGNOSIS — E440 Moderate protein-calorie malnutrition: Secondary | ICD-10-CM

## 2023-04-01 DIAGNOSIS — Z66 Do not resuscitate: Secondary | ICD-10-CM

## 2023-04-01 DIAGNOSIS — N1832 Chronic kidney disease, stage 3b: Secondary | ICD-10-CM | POA: Diagnosis not present

## 2023-04-02 ENCOUNTER — Encounter: Payer: Self-pay | Admitting: Student

## 2023-04-02 NOTE — Progress Notes (Signed)
Location:  Other Nursing Home Room Number: 203A Place of Service:  ALF [phone]) Provider: Ander Gaster, Benetta Spar, MD  Patient Care Team: Earnestine Mealing, MD as PCP - General [facility] Medicine)  Extended Emergency Contact Information Primary Emergency Contact: Smith,Kendal Address: [address]          [place], [place] [address] D[place] of [place] Home Phone: [phone] Mobile Phone: [phone] Relation: Daughter  Code Status: DNR Goals of care: Advanced Directive information    01/03/2023    8:55 AM  Advanced Directives  Does Patient Have a Medical Advance Directive? Yes  Type of Estate agent of [place];Out of facility DNR (pink MOST or yellow form);Living will  Does patient want to make changes to medical advance directive? No - Patient declined  Copy of Healthcare Power of Attorney in Chart? Yes - validated most recent copy scanned in chart (See row information)     Chief Complaint  Patient presents with   Medical Management of Chronic Conditions    HPI:  Pt is a 89 y.o. male seen today for medical management of chronic diseases.  Patient lying in bed when asked how he is doing this when he states I do not know yet.  He denies pain.  States that he is eating well.  Urinating well have normal bowel movements.  Discussion with nursing and CNA's regarding patient's behaviors.  Patient with kicking and screaming with care for the last few months.  He also has threatened to call the cops while getting routine assistance for getting dressed.  More more difficult to redirect with various tactics.  Past Medical History:  Diagnosis Date   History of prostate cancer    RT 2006   Hx of colonic polyps    Hypertension    Mild cognitive impairment    OSA (obstructive sleep apnea)    on CPAP of 10   Rosacea    Tremor, essential    Wolff-Parkinson-White syndrome 1970   Past Surgical History:  Procedure Laterality Date   INTRAMEDULLARY (IM) NAIL  INTERTROCHANTERIC Left 04/12/2020   Procedure: INTRAMEDULLARY (IM) NAIL INTERTROCHANTRIC;  Surgeon: Lyndle Herrlich, MD;  Location: [facility] ORS;  Service: Orthopedics;  Laterality: Left;   TONSILLECTOMY     TOTAL KNEE ARTHROPLASTY Left 3/14    Allergies  Allergen Reactions   Lisinopril     REACTION: cough   Lactose Intolerance (Gi) Diarrhea    Outpatient Encounter Medications as of 04/01/2023  Medication Sig   acetaminophen (TYLENOL) 325 MG tablet Take 650 mg by mouth 3 (three) times daily.   acetaminophen (TYLENOL) 500 MG tablet Take 500 mg by mouth every 12 (twelve) hours as needed. Give Two tablets by mouth three times daily for pain in Right Hip and Leg   amLODipine (NORVASC) 10 MG tablet TAKE 1 TABLET BY MOUTH EVERY DAY   Benzocaine-Menthol (ORAJEL 2X TOOTHACHE & GUM) 20-0.26 % GEL Use as directed 1 application  in the mouth or throat every 2 (two) hours as needed.   chlorhexidine (PERIDEX) 0.12 % solution Use as directed 30 mLs in the mouth or throat 3 (three) times daily.   cholestyramine (QUESTRAN) 4 g packet Take 4 g by mouth at bedtime.   guaifenesin (ROBITUSSIN) 100 MG/5ML syrup Take 100 mg by mouth every 4 (four) hours as needed for cough.   hydrochlorothiazide (HYDRODIURIL) 25 MG tablet Take 1 tablet (25 mg total) by mouth daily.   loratadine (CLARITIN) 10 MG tablet Take 10 mg by mouth daily.  losartan (COZAAR) 100 MG tablet Take 100 mg by mouth daily.   memantine (NAMENDA) 10 MG tablet Take 10 mg by mouth 2 (two) times daily.   Multiple Vitamins-Minerals (MENS MULTI VITAMIN & MINERAL PO)    sertraline (ZOLOFT) 100 MG tablet Take 100 mg by mouth daily.   No facility-administered encounter medications on file as of 04/01/2023.    Review of Systems  Immunization History  Administered Date(s) Administered   Influenza, High Dose Seasonal PF 02/07/2022   Influenza, Seasonal, Injecte, Preservative Fre 02/16/2016   Influenza-Unspecified 02/09/2014, 02/21/2015   Moderna  Covid-19 Fall Seasonal Vaccine 60yrs & older 07/31/2022   Moderna SARS-COV2 Booster Vaccination 09/19/2021   Moderna Sars-Covid-2 Vaccination 09/09/2020, 03/02/2022   PFIZER Comirnaty(Gray Top)Covid-19 Tri-Sucrose Vaccine 03/02/2022   Pfizer Covid-19 Vaccine Bivalent Booster 41yrs & up 01/12/2021   Pneumococcal Conjugate-13 05/19/2014   Pneumococcal Polysaccharide-23 04/23/2005   Td 04/23/2005   Tetanus 05/27/2015   Zoster Recombinant(Shingrix) 08/13/2022, 11/13/2022   Zoster, Live 05/28/2013   Pertinent  Health Maintenance Due  Topic Date Due   INFLUENZA VACCINE  11/22/2022      04/14/2020    1:00 AM 04/14/2020    8:00 AM 04/15/2020    3:00 AM 04/15/2020    7:56 AM 10/18/2022   10:13 AM  Fall Risk  Falls in the past year?     1  Was there an injury with Fall?     0  Fall Risk Category Calculator     2  (RETIRED) Patient Fall Risk Level High fall risk High fall risk High fall risk High fall risk   Patient at Risk for Falls Due to     History of fall(s);Impaired mobility;Impaired vision  Fall risk Follow up     Falls evaluation completed   Functional Status Survey:    Vitals:   04/01/23 0830  BP: (!) 151/64  Pulse: 60  Resp: 17  Temp: 97.8 F (36.6 C)  SpO2: 96%  Weight: 158 lb 9.6 oz (71.9 kg)   Body mass index is 22.76 kg/m. Physical Exam Constitutional:      Comments: Frail, elderly  Cardiovascular:     Rate and Rhythm: Normal rate.     Pulses: Normal pulses.  Pulmonary:     Effort: Pulmonary effort is normal.  Abdominal:     General: Abdomen is flat.     Palpations: Abdomen is soft.  Skin:    General: Skin is warm.  Neurological:     Mental Status: He is alert. Mental status is at baseline. He is disoriented.     Labs reviewed: Recent Labs    04/05/22 0000 08/23/22 0000  NA 140 137  K 4.1 3.5  CL 105 104  CO2 25* 26*  BUN 27* 25*  CREATININE 1.2 1.1  CALCIUM 9.2 8.7   Recent Labs    04/05/22 0000 08/23/22 0000  AST 26 19  ALT 27 15   ALKPHOS 64 60  ALBUMIN 3.8 3.5   Recent Labs    04/05/22 0000 08/23/22 0000  WBC 7.7 7.1  NEUTROABS 4,304.00 3,749.00  HGB 13.7 12.4*  HCT 41 37*  PLT 242 229   Lab Results  Component Value Date   TSH 1.22 05/12/2013   No results found for: "HGBA1C" No results found for: "CHOL", "HDL", "LDLCALC", "LDLDIRECT", "TRIG", "CHOLHDL"  Significant Diagnostic Results in last 30 days:  No results found.  Assessment/Plan Vascular dementia with behavior disturbance (HCC)  Chronic kidney disease (CKD) stage G3b/A2, moderately decreased  glomerular filtration rate (GFR) between 30-44 mL/min/1.73 square meter and albuminuria creatinine ratio between 30-299 mg/g (HCC)  Essential hypertension, benign  Malnutrition of moderate degree (HCC)  DNR (do not resuscitate) Patient with history of dementia which continues to progress.  He is communicative however requires significant assistance for bathing and dressing.  Currently on Namenda and Zoloft 100, however continues to have worsening afternoon behaviors.  Will start trazodone 2000 mg daily as needed in the afternoon for behaviors.  Blood pressure typically at goal with blood pressure less than 140/90.  Continue amlodipine 10 mg daily continue Cozaar 100 mg daily continue hydrochlorothiazide 25 mg daily. 11/7 hgb stable at 13.0.   Family/ staff Communication: nursing  Labs/tests ordered:  none

## 2023-04-05 DIAGNOSIS — I7091 Generalized atherosclerosis: Secondary | ICD-10-CM | POA: Diagnosis not present

## 2023-04-05 DIAGNOSIS — B351 Tinea unguium: Secondary | ICD-10-CM | POA: Diagnosis not present

## 2023-05-01 ENCOUNTER — Other Ambulatory Visit: Payer: Self-pay | Admitting: Student

## 2023-05-01 DIAGNOSIS — F01518 Vascular dementia, unspecified severity, with other behavioral disturbance: Secondary | ICD-10-CM

## 2023-05-01 MED ORDER — LORAZEPAM 0.5 MG PO TABS
0.5000 mg | ORAL_TABLET | ORAL | 0 refills | Status: DC
Start: 2023-05-02 — End: 2023-05-08

## 2023-05-01 NOTE — Progress Notes (Signed)
 Patient with increase in behaviors with showers. Significant agitation, kicking, spitting, and calling the police.

## 2023-05-04 DIAGNOSIS — N390 Urinary tract infection, site not specified: Secondary | ICD-10-CM | POA: Diagnosis not present

## 2023-05-08 ENCOUNTER — Telehealth: Payer: Self-pay | Admitting: Student

## 2023-05-08 ENCOUNTER — Encounter: Payer: Self-pay | Admitting: Student

## 2023-05-08 ENCOUNTER — Non-Acute Institutional Stay: Payer: Self-pay | Admitting: Student

## 2023-05-08 DIAGNOSIS — I10 Essential (primary) hypertension: Secondary | ICD-10-CM | POA: Diagnosis not present

## 2023-05-08 DIAGNOSIS — N1832 Chronic kidney disease, stage 3b: Secondary | ICD-10-CM

## 2023-05-08 DIAGNOSIS — Z66 Do not resuscitate: Secondary | ICD-10-CM

## 2023-05-08 DIAGNOSIS — F01518 Vascular dementia, unspecified severity, with other behavioral disturbance: Secondary | ICD-10-CM

## 2023-05-08 DIAGNOSIS — R079 Chest pain, unspecified: Secondary | ICD-10-CM | POA: Diagnosis not present

## 2023-05-08 NOTE — Telephone Encounter (Signed)
 Received phone call from nursing staff that patient has slurred speech and right sided weakness. Unclear if this is recurrent over the last 24 hours. Contacted patient's daughter to discuss concerns. She states that her primary goal for him is to remain comfortable rather than seek extraordinary measures. She would be interested in starting ASA for him given potential transient ischemic attack. Given no interventions for potential stroke, will defer transfer to hospital at this time and maintain patient's comfort.

## 2023-05-08 NOTE — Progress Notes (Signed)
Location:  [facility].   Provider: Dr. Earnestine Mealing  Code Status: DNR Goals of Care:     05/08/2023    8:53 AM  Advanced Directives  Does Patient Have a Medical Advance Directive? Yes  Type of Estate agent of [place];Out of facility DNR (pink MOST or yellow form);Living will  Does patient want to make changes to medical advance directive? No - Patient declined  Copy of Healthcare Power of Attorney in Chart? Yes - validated most recent copy scanned in chart (See row information)     Chief Complaint  Patient presents with   Medical Management of Chronic Issues    Medical Management of Chronic Issues.     HPI: Patient is a 89 y.o. male seen today for medical management of chronic diseases.   Patient has had changes in strength and garbled speech multiple times in the last 24 hours  He gives his name and date of birth (baseline)   Contacted patient's daughter regarding concern for patient's general decline. He has had more episodes of confusion and combativeness. Less redirectable. Numerous medication trials and failures. She states she would like to make sure he remains comfortable and she plans to see him later today. Agreed to continue GOC conversations.   Past Medical History:  Diagnosis Date   History of prostate cancer    RT 2006   Hx of colonic polyps    Hypertension    Mild cognitive impairment    OSA (obstructive sleep apnea)    on CPAP of 10   Rosacea    Tremor, essential    Wolff-Parkinson-White syndrome 1970    Past Surgical History:  Procedure Laterality Date   INTRAMEDULLARY (IM) NAIL INTERTROCHANTERIC Left 04/12/2020   Procedure: INTRAMEDULLARY (IM) NAIL INTERTROCHANTRIC;  Surgeon: Lyndle Herrlich, MD;  Location: [facility] ORS;  Service: Orthopedics;  Laterality: Left;   TONSILLECTOMY     TOTAL KNEE ARTHROPLASTY Left 3/14    Allergies  Allergen Reactions   Lisinopril     REACTION: cough   Lactose Intolerance (Gi)  Diarrhea    Outpatient Encounter Medications as of 05/08/2023  Medication Sig   acetaminophen (TYLENOL) 500 MG tablet Take 500 mg by mouth every 12 (twelve) hours as needed. Give Two tablets by mouth three times daily for pain in Right Hip and Leg   amLODipine (NORVASC) 10 MG tablet TAKE 1 TABLET BY MOUTH EVERY DAY   Benzocaine-Menthol (ORAJEL 2X TOOTHACHE & GUM) 20-0.26 % GEL Use as directed 1 application  in the mouth or throat every 2 (two) hours as needed.   busPIRone (BUSPAR) 5 MG tablet Take 5 mg by mouth 2 (two) times daily.   chlorhexidine (PERIDEX) 0.12 % solution Use as directed 30 mLs in the mouth or throat 3 (three) times daily.   cholestyramine (QUESTRAN) 4 g packet Take 4 g by mouth at bedtime.   guaifenesin (ROBITUSSIN) 100 MG/5ML syrup Take 100 mg by mouth every 4 (four) hours as needed for cough.   hydrochlorothiazide (HYDRODIURIL) 25 MG tablet Take 1 tablet (25 mg total) by mouth daily.   loratadine (CLARITIN) 10 MG tablet Take 10 mg by mouth daily.   LORazepam (ATIVAN) 0.5 MG tablet Take 0.5 mg by mouth. Once daily every Wednesday and Saturday to calm prior to bathing.   losartan (COZAAR) 100 MG tablet Take 100 mg by mouth daily.   memantine (NAMENDA) 10 MG tablet Take 10 mg by mouth 2 (two) times daily.   Multiple  Vitamins-Minerals (MENS MULTI VITAMIN & MINERAL PO)    OLANZapine (ZYPREXA) 2.5 MG tablet Take 2.5 mg by mouth 2 (two) times daily.   sertraline (ZOLOFT) 100 MG tablet Take 100 mg by mouth daily.   traZODone (DESYREL) 50 MG tablet Take 25 mg by mouth at bedtime as needed. (Patient not taking: Reported on 05/08/2023)   [DISCONTINUED] acetaminophen (TYLENOL) 325 MG tablet Take 650 mg by mouth 3 (three) times daily.   [DISCONTINUED] LORazepam (ATIVAN) 0.5 MG tablet Take 1 tablet (0.5 mg total) by mouth 2 (two) times a week. As needed for patient care   No facility-administered encounter medications on file as of 05/08/2023.    Review of Systems:  Review of  Systems  Health Maintenance  Topic Date Due   COVID-19 Vaccine (6 - 2024-25 season) 12/23/2022   Medicare Annual Wellness (AWV)  07/24/2023   DTaP/Tdap/Td (3 - Tdap) 05/26/2025   Pneumonia Vaccine 64+ Years old  Completed   INFLUENZA VACCINE  Completed   Zoster Vaccines- Shingrix  Completed   HPV VACCINES  Aged Out    Physical Exam: Vitals:   05/08/23 0844 05/08/23 0856  BP: (!) 167/90 (!) 143/74  Pulse: 71   Resp: 16   Temp: (!) 96.9 F (36.1 C)   SpO2: 93%   Weight: 159 lb (72.1 kg)   Height: 5\' 10"  (1.778 m)    Body mass index is 22.81 kg/m. Physical Exam Constitutional:      Comments: Lying in bed. Gives his name and date of birth  Cardiovascular:     Rate and Rhythm: Normal rate.     Pulses: Normal pulses.  Pulmonary:     Effort: Pulmonary effort is normal.  Musculoskeletal:     Comments: 5/5 grip strength bilaterally, unable to participate in extensive neurological testing  Neurological:     Mental Status: He is alert. Mental status is at baseline. He is disoriented.     Labs reviewed: Basic Metabolic Panel: Recent Labs    08/23/22 0000 02/27/23 0000  NA 137 138  K 3.5 3.4*  CL 104 102  CO2 26* 27*  BUN 25* 25*  CREATININE 1.1 1.0  CALCIUM 8.7 9.2   Liver Function Tests: Recent Labs    08/23/22 0000 02/27/23 0000  AST 19 23  ALT 15 19  ALKPHOS 60 84  ALBUMIN 3.5 3.9   No results for input(s): "LIPASE", "AMYLASE" in the last 8760 hours. No results for input(s): "AMMONIA" in the last 8760 hours. CBC: Recent Labs    08/23/22 0000 02/27/23 0000  WBC 7.1 8.8  NEUTROABS 3,749.00 5,315.00  HGB 12.4* 13.0*  HCT 37* 40*  PLT 229  --    Lipid Panel: No results for input(s): "CHOL", "HDL", "LDLCALC", "TRIG", "CHOLHDL", "LDLDIRECT" in the last 8760 hours. No results found for: "HGBA1C"  Procedures since last visit: No results found.  Assessment/Plan Essential hypertension, benign  Chronic kidney disease (CKD) stage G3b/A2, moderately  decreased glomerular filtration rate (GFR) between 30-44 mL/min/1.73 square meter and albuminuria creatinine ratio between 30-299 mg/g (HCC), Chronic  DNR (do not resuscitate)  Vascular dementia with behavior disturbance (HCC) Patient appears to be at his baseline at this time, however, he is high risk for continued decompensation. Per GOC at this time BP slightly above goal. Patient has had episodes of kicking and hitting staff concerning for their safety. Will plan to continue medications for dementia at this time. Received a second call later in the afternoon that patient has persistent weakness  and contacted Kendal regarding goals of care. She does not want to send him to the hospital based on increased risk of confusion. Will approach care with comfort measures.   Labs/tests ordered:  * No order type specified * Next appt:  Visit date not found

## 2023-05-09 DIAGNOSIS — I10 Essential (primary) hypertension: Secondary | ICD-10-CM | POA: Diagnosis not present

## 2023-05-10 ENCOUNTER — Other Ambulatory Visit: Payer: Self-pay | Admitting: Student

## 2023-05-10 DIAGNOSIS — F01518 Vascular dementia, unspecified severity, with other behavioral disturbance: Secondary | ICD-10-CM

## 2023-05-10 MED ORDER — LORAZEPAM 0.5 MG PO TABS: 0.5000 mg | ORAL_TABLET | Freq: Three times a day (TID) | ORAL | 0 refills | Status: DC | PRN

## 2023-05-10 NOTE — Progress Notes (Signed)
Patient with increased behaviors, now dysarthric and with weakness. Family prefers less interventions and comfort measures, will consult hospice at this time.   Comfort meds ordered.

## 2023-05-13 DIAGNOSIS — M25571 Pain in right ankle and joints of right foot: Secondary | ICD-10-CM | POA: Diagnosis not present

## 2023-05-14 ENCOUNTER — Non-Acute Institutional Stay: Payer: Self-pay | Admitting: Nurse Practitioner

## 2023-05-14 ENCOUNTER — Encounter: Payer: Self-pay | Admitting: Nurse Practitioner

## 2023-05-14 ENCOUNTER — Encounter: Payer: Self-pay | Admitting: Student

## 2023-05-14 DIAGNOSIS — F39 Unspecified mood [affective] disorder: Secondary | ICD-10-CM

## 2023-05-14 DIAGNOSIS — F01518 Vascular dementia, unspecified severity, with other behavioral disturbance: Secondary | ICD-10-CM

## 2023-05-14 MED ORDER — LORAZEPAM 1 MG PO TABS: ORAL_TABLET | ORAL | 0 refills | Status: DC

## 2023-05-14 NOTE — Progress Notes (Signed)
Location:  Other [facility].  Nursing Home Room Number: [facility] 203A Place of Service:  ALF [phone]) Abbey Chatters, NP  PCP: Earnestine Mealing, MD  Patient Care Team: Earnestine Mealing, MD as PCP - General [facility] Medicine)  Extended Emergency Contact Information Primary Emergency Contact: Smith,Kendal Address: [address]          [place], [place] [address] [place] of [place] Home Phone: [phone] Mobile Phone: [phone] Relation: Daughter  Goals of care: Advanced Directive information    05/14/2023    8:50 AM  Advanced Directives  Does Patient Have a Medical Advance Directive? Yes  Type of Estate agent of [place];Out of facility DNR (pink MOST or yellow form);Living will  Does patient want to make changes to medical advance directive? No - Patient declined  Copy of Healthcare Power of Attorney in Chart? Yes - validated most recent copy scanned in chart (See row information)     Chief Complaint  Patient presents with   Acute Visit    Behavior Changes.     HPI:  Pt is a 89 y.o. male seen today for an acute visit for Behavior Changes. Pt is now on hospice and on 1/15 a lot of medication were stopped however over the last several days he has become increasingly combative.  He is spitting, kicking and verbally abusive during care.  Staff having to use multiple staff members to keep patient safe during cleaning patient as he is incontinent of bowel and bladder. He is also attempting to stand and unable to stand or walk.    Past Medical History:  Diagnosis Date   History of prostate cancer    RT 2006   Hx of colonic polyps    Hypertension    Mild cognitive impairment    OSA (obstructive sleep apnea)    on CPAP of 10   Rosacea    Tremor, essential    Wolff-Parkinson-White syndrome 1970   Past Surgical History:  Procedure Laterality Date   INTRAMEDULLARY (IM) NAIL INTERTROCHANTERIC Left 04/12/2020   Procedure: INTRAMEDULLARY  (IM) NAIL INTERTROCHANTRIC;  Surgeon: Lyndle Herrlich, MD;  Location: [facility] ORS;  Service: Orthopedics;  Laterality: Left;   TONSILLECTOMY     TOTAL KNEE ARTHROPLASTY Left 3/14    Allergies  Allergen Reactions   Lisinopril     REACTION: cough   Lactose Intolerance (Gi) Diarrhea    Outpatient Encounter Medications as of 05/14/2023  Medication Sig   acetaminophen (TYLENOL) 500 MG tablet Take 500 mg by mouth every 12 (twelve) hours as needed. Give Two tablets by mouth three times daily for pain in Right Hip and Leg   amLODipine (NORVASC) 10 MG tablet TAKE 1 TABLET BY MOUTH EVERY DAY   aspirin EC 81 MG tablet Take 81 mg by mouth daily. Swallow whole.   cholestyramine (QUESTRAN) 4 g packet Take 4 g by mouth at bedtime.   guaifenesin (ROBITUSSIN) 100 MG/5ML syrup Take 100 mg by mouth every 4 (four) hours as needed for cough.   hydrochlorothiazide (HYDRODIURIL) 25 MG tablet Take 1 tablet (25 mg total) by mouth daily.   LORazepam (ATIVAN) 0.5 MG tablet Take 1 tablet (0.5 mg total) by mouth every 8 (eight) hours as needed for anxiety. Once daily every Wednesday and Saturday to calm prior to bathing. (Patient taking differently: Take 0.5 mg by mouth every 8 (eight) hours as needed for anxiety.)   losartan (COZAAR) 100 MG tablet Take 100 mg by mouth daily.   OLANZapine (ZYPREXA) 2.5  MG tablet Take 2.5 mg by mouth 2 (two) times daily.   sertraline (ZOLOFT) 100 MG tablet Take 100 mg by mouth daily.   Benzocaine-Menthol (ORAJEL 2X TOOTHACHE & GUM) 20-0.26 % GEL Use as directed 1 application  in the mouth or throat every 2 (two) hours as needed. (Patient not taking: Reported on 05/14/2023)   busPIRone (BUSPAR) 5 MG tablet Take 5 mg by mouth 2 (two) times daily. (Patient not taking: Reported on 05/14/2023)   chlorhexidine (PERIDEX) 0.12 % solution Use as directed 30 mLs in the mouth or throat 3 (three) times daily. (Patient not taking: Reported on 05/14/2023)   loratadine (CLARITIN) 10 MG tablet Take 10 mg by  mouth daily. (Patient not taking: Reported on 05/14/2023)   memantine (NAMENDA) 10 MG tablet Take 10 mg by mouth 2 (two) times daily. (Patient not taking: Reported on 05/14/2023)   Multiple Vitamins-Minerals (MENS MULTI VITAMIN & MINERAL PO)  (Patient not taking: Reported on 05/14/2023)   No facility-administered encounter medications on file as of 05/14/2023.    Review of Systems  Unable to perform ROS: Dementia    Immunization History  Administered Date(s) Administered   Influenza, High Dose Seasonal PF 02/07/2022   Influenza, Seasonal, Injecte, Preservative Fre 02/16/2016   Influenza-Unspecified 02/09/2014, 02/21/2015, 02/15/2023   Moderna Covid-19 Fall Seasonal Vaccine 52yrs & older 07/31/2022   Moderna SARS-COV2 Booster Vaccination 09/19/2021   Moderna Sars-Covid-2 Vaccination 09/09/2020, 03/02/2022   PFIZER Comirnaty(Gray Top)Covid-19 Tri-Sucrose Vaccine 03/02/2022   Pfizer Covid-19 Vaccine Bivalent Booster 33yrs & up 01/12/2021   Pneumococcal Conjugate-13 05/19/2014   Pneumococcal Polysaccharide-23 04/23/2005   Td 04/23/2005   Tetanus 05/27/2015   Zoster Recombinant(Shingrix) 08/13/2022, 11/13/2022   Zoster, Live 05/28/2013   Pertinent  Health Maintenance Due  Topic Date Due   INFLUENZA VACCINE  Completed      04/14/2020    1:00 AM 04/14/2020    8:00 AM 04/15/2020    3:00 AM 04/15/2020    7:56 AM 10/18/2022   10:13 AM  Fall Risk  Falls in the past year?     1  Was there an injury with Fall?     0  Fall Risk Category Calculator     2  (RETIRED) Patient Fall Risk Level High fall risk High fall risk High fall risk High fall risk   Patient at Risk for Falls Due to     History of fall(s);Impaired mobility;Impaired vision  Fall risk Follow up     Falls evaluation completed   Functional Status Survey:    Vitals:   05/14/23 0829 05/14/23 0852  BP: (!) 143/85 124/73  Pulse: 72   Resp: 16   Temp: 97.8 F (36.6 C)   SpO2: 96%   Weight: 159 lb (72.1 kg)   Height: 5'  10" (1.778 m)    Body mass index is 22.81 kg/m. Physical Exam Constitutional:      Appearance: Normal appearance.  Neurological:     Mental Status: He is alert. He is disoriented.     Motor: Weakness present.     Gait: Gait abnormal.  Psychiatric:        Behavior: Behavior is uncooperative.        Cognition and Memory: Memory is impaired. He exhibits impaired recent memory and impaired remote memory.     Labs reviewed: Recent Labs    08/23/22 0000 02/27/23 0000  NA 137 138  K 3.5 3.4*  CL 104 102  CO2 26* 27*  BUN 25* 25*  CREATININE 1.1 1.0  CALCIUM 8.7 9.2   Recent Labs    08/23/22 0000 02/27/23 0000  AST 19 23  ALT 15 19  ALKPHOS 60 84  ALBUMIN 3.5 3.9   Recent Labs    08/23/22 0000 02/27/23 0000  WBC 7.1 8.8  NEUTROABS 3,749.00 5,315.00  HGB 12.4* 13.0*  HCT 37* 40*  PLT 229  --    Lab Results  Component Value Date   TSH 1.22 05/12/2013   No results found for: "HGBA1C" No results found for: "CHOL", "HDL", "LDLCALC", "LDLDIRECT", "TRIG", "CHOLHDL"  Significant Diagnostic Results in last 30 days:  No results found.  Assessment/Plan 1. Mood disorder (HCC) (Primary) -will restart buspar 5 mg BID and add ativan gel as he is not taking any oral medications at this time due to agitation.  - LORazepam (ATIVAN) 1 MG tablet; Ativan gel 1 mg q12 hours topically for increase in agitation/anxiety  Dispense: 30 tablet; Refill: 0  2. Vascular dementia with behavior disturbance (HCC) -progressive decline, continues on hospice and supportive care    Lynette Noah K. [facility] [phone]

## 2023-05-15 NOTE — Telephone Encounter (Signed)
Forwarded to Dr. [name] to review and sign.

## 2023-05-17 ENCOUNTER — Other Ambulatory Visit: Payer: Self-pay | Admitting: Student

## 2023-05-17 DIAGNOSIS — F01518 Vascular dementia, unspecified severity, with other behavioral disturbance: Secondary | ICD-10-CM

## 2023-05-17 MED ORDER — LORAZEPAM 0.5 MG PO TABS: 0.5000 mg | ORAL_TABLET | Freq: Three times a day (TID) | ORAL | Status: DC | PRN

## 2023-05-17 MED ORDER — HALOPERIDOL LACTATE 2 MG/ML PO CONC: 2.0000 mg | Freq: Two times a day (BID) | ORAL | 1 refills | Status: DC

## 2023-05-17 NOTE — Progress Notes (Signed)
Refill comfort medications

## 2023-05-31 ENCOUNTER — Non-Acute Institutional Stay (SKILLED_NURSING_FACILITY): Payer: Self-pay | Admitting: Student

## 2023-05-31 ENCOUNTER — Encounter: Payer: Self-pay | Admitting: Student

## 2023-05-31 DIAGNOSIS — Z66 Do not resuscitate: Secondary | ICD-10-CM | POA: Diagnosis not present

## 2023-05-31 DIAGNOSIS — F01518 Vascular dementia, unspecified severity, with other behavioral disturbance: Secondary | ICD-10-CM

## 2023-05-31 DIAGNOSIS — I10 Essential (primary) hypertension: Secondary | ICD-10-CM

## 2023-05-31 DIAGNOSIS — N1832 Chronic kidney disease, stage 3b: Secondary | ICD-10-CM | POA: Diagnosis not present

## 2023-05-31 NOTE — Progress Notes (Signed)
 Provider:  Richerd MYRTIS Brigham, M.D. Location:  Other Nursing Home Room Number: 522 A Place of Service:  ALF (13)  PCP: Brigham Richerd, MD Patient Care Team: Brigham Richerd, MD as PCP - General [facility] Medicine)  Extended Emergency Contact Information Primary Emergency Contact: Smith,Kendal Address: [address]          [place], [place] [address] [place] Home Phone: [phone] Mobile Phone: [phone] Relation: Daughter  Code Status: DNR Goals of Care: Advanced Directive information    05/14/2023    8:50 AM  Advanced Directives  Does Patient Have a Medical Advance Directive? Yes  Type of Estate Agent of [place];Out of facility DNR (pink MOST or yellow form);Living will  Does patient want to make changes to medical advance directive? No - Patient declined  Copy of Healthcare Power of Attorney in Chart? Yes - validated most recent copy scanned in chart (See row information)      Chief Complaint  Patient presents with   New Admit To SNF    New admission to [facility]     HPI: Patient is a 89 y.o. male seen today for admission to History of Present Illness The patient, with dementia, presents with significant decline for hospice level care management. He is accompanied by his daughter.  He was recently moved from a memory care unit to a skilled nursing facility for hospice level care due to a significant decline in his dementia. His daughter notes that he seems 'bright and happy' in the new location, and he is eating and communicating well.  He continues to experience afternoon behaviors that require management with medications. He is prescribed Haldol  2.5 mg every four hours as needed for agitation. If oral medication is refused, Ativan  gel is available as an alternative. Ativan  1 mg every four hours as needed is the second line of treatment for agitation.  Past Medical History:  Diagnosis Date   History of prostate cancer     RT 2006   Hx of colonic polyps    Hypertension    Mild cognitive impairment    OSA (obstructive sleep apnea)    on CPAP of 10   Rosacea    Tremor, essential    Wolff-Parkinson-White syndrome 1970   Past Surgical History:  Procedure Laterality Date   INTRAMEDULLARY (IM) NAIL INTERTROCHANTERIC Left 04/12/2020   Procedure: INTRAMEDULLARY (IM) NAIL INTERTROCHANTRIC;  Surgeon: Leora Lynwood SAUNDERS, MD;  Location: [facility] ORS;  Service: Orthopedics;  Laterality: Left;   TONSILLECTOMY     TOTAL KNEE ARTHROPLASTY Left 3/14    reports that he quit smoking about 65 years ago. His smoking use included cigarettes. He has never used smokeless tobacco. He reports current alcohol use. He reports that he does not use drugs. Social History   Socioeconomic History   Marital status: Widowed    Spouse name: Not on file   Number of children: 3   Years of education: Not on file   Highest education level: Not on file  Occupational History   Occupation: retired Chiropodist: retired  Tobacco Use   Smoking status: Former    Current packs/day: 0.00    Types: Cigarettes    Quit date: 04/23/1958    Years since quitting: 65.1   Smokeless tobacco: Never  Vaping Use   Vaping status: Never Used  Substance and Sexual Activity   Alcohol use: Yes    Alcohol/week: 0.0 standard drinks of alcohol    Comment:  1 beer daily   Drug use: No   Sexual activity: Not on file  Other Topics Concern   Not on file  Social History Narrative   Wrote poetry and  short stories.  Occ memoirs (has been published)      Has living will.    Requested DNR so form completed.    Requests daughter Arnaldo as health care POA.    Would accept feeding tube unless vegetative state         Social Drivers of Health   Financial Resource Strain: Not on file  Food Insecurity: Not on file  Transportation Needs: Not on file  Physical Activity: Not on file  Stress: Not on file  Social Connections: Not on file   Intimate Partner Violence: Not on file    Functional Status Survey:    Family History  Problem Relation Age of Onset   Stroke Brother     Health Maintenance  Topic Date Due   COVID-19 Vaccine (6 - 2024-25 season) 12/23/2022   DTaP/Tdap/Td (3 - Tdap) 05/26/2025   Pneumonia Vaccine 13+ Years old  Completed   INFLUENZA VACCINE  Completed   Zoster Vaccines- Shingrix  Completed   HPV VACCINES  Aged Out    Allergies  Allergen Reactions   Lisinopril     REACTION: cough   Lactose Intolerance (Gi) Diarrhea    Outpatient Encounter Medications as of 05/31/2023  Medication Sig   acetaminophen  (TYLENOL ) 500 MG tablet Take 1,000 mg by mouth every 8 (eight) hours as needed. For 14 days   cholestyramine  (QUESTRAN ) 4 g packet Take 4 g by mouth at bedtime.   guaifenesin (ROBITUSSIN) 100 MG/5ML syrup Take 100 mg by mouth every 4 (four) hours as needed for cough.   haloperidol  (HALDOL ) 2 MG/ML solution Take 2.5 mLs by mouth every 4 (four) hours as needed for agitation (x 6 months).   LORazepam  (ATIVAN ) 1 MG tablet Take 1 mg by mouth every 4 (four) hours as needed for anxiety.   Lorazepam  POWD by Does not apply route. Apply to skin topically every 8 hours as needed for anxiety for 6 Months use only if unable to swallow tablet   amLODipine  (NORVASC ) 10 MG tablet TAKE 1 TABLET BY MOUTH EVERY DAY (Patient not taking: Reported on 05/31/2023)   aspirin EC 81 MG tablet Take 81 mg by mouth daily. Swallow whole. (Patient not taking: Reported on 05/31/2023)   haloperidol  (HALDOL ) 2 MG/ML solution Take 1 mL (2 mg total) by mouth 2 (two) times daily. (Patient not taking: Reported on 05/31/2023)   hydrochlorothiazide  (HYDRODIURIL ) 25 MG tablet Take 1 tablet (25 mg total) by mouth daily. (Patient not taking: Reported on 05/31/2023)   LORazepam  (ATIVAN ) 0.5 MG tablet Take 1 tablet (0.5 mg total) by mouth every 8 (eight) hours as needed for anxiety. (Patient not taking: Reported on 05/31/2023)   losartan (COZAAR) 100 MG  tablet Take 100 mg by mouth daily. (Patient not taking: Reported on 05/31/2023)   memantine  (NAMENDA ) 10 MG tablet Take 10 mg by mouth 2 (two) times daily. (Patient not taking: Reported on 05/14/2023)   sertraline  (ZOLOFT ) 100 MG tablet Take 100 mg by mouth daily. (Patient not taking: Reported on 05/31/2023)   No facility-administered encounter medications on file as of 05/31/2023.    Review of Systems  Vitals:   05/31/23 0859  BP: 134/88  Pulse: 74  Weight: 146 lb 9.6 oz (66.5 kg)  Height: 5' 10 (1.778 m)   Body mass index is  21.03 kg/m. Physical Exam Constitutional:      Appearance: Normal appearance.  Cardiovascular:     Rate and Rhythm: Normal rate and regular rhythm.     Pulses: Normal pulses.     Heart sounds: Normal heart sounds.  Pulmonary:     Effort: Pulmonary effort is normal.  Abdominal:     General: Abdomen is flat. Bowel sounds are normal.     Palpations: Abdomen is soft.  Musculoskeletal:        General: No swelling or tenderness.  Skin:    General: Skin is warm and dry.  Neurological:     Mental Status: He is alert. Mental status is at baseline. He is disoriented.  Psychiatric:        Mood and Affect: Mood normal.     Labs reviewed: Basic Metabolic Panel: Recent Labs    08/23/22 0000 02/27/23 0000  NA 137 138  K 3.5 3.4*  CL 104 102  CO2 26* 27*  BUN 25* 25*  CREATININE 1.1 1.0  CALCIUM 8.7 9.2   Liver Function Tests: Recent Labs    08/23/22 0000 02/27/23 0000  AST 19 23  ALT 15 19  ALKPHOS 60 84  ALBUMIN 3.5 3.9   No results for input(s): LIPASE, AMYLASE in the last 8760 hours. No results for input(s): AMMONIA in the last 8760 hours. CBC: Recent Labs    08/23/22 0000 02/27/23 0000  WBC 7.1 8.8  NEUTROABS 3,749.00 5,315.00  HGB 12.4* 13.0*  HCT 37* 40*  PLT 229  --    Cardiac Enzymes: No results for input(s): CKTOTAL, CKMB, CKMBINDEX, TROPONINI in the last 8760 hours. BNP: Invalid input(s): POCBNP No results  found for: HGBA1C Lab Results  Component Value Date   TSH 1.22 05/12/2013   Lab Results  Component Value Date   VITAMINB12 511 05/27/2015   No results found for: FOLATE No results found for: IRON, TIBC, FERRITIN  Imaging and Procedures obtained prior to SNF admission: DG HIP OPERATIVE UNILAT W OR W/O PELVIS LEFT Result Date: 04/12/2020 CLINICAL DATA:  Status post left IM nail. EXAM: OPERATIVE left HIP (WITH PELVIS IF PERFORMED) 2 VIEWS FLUOROSCOPY TIME:  24 seconds TECHNIQUE: Fluoroscopic spot image(s) were submitted for interpretation post-operatively. COMPARISON:  04/11/2020 FINDINGS: Five sequential images obtained via portable C-arm radiography in the operating room show open reduction and internal fixation of the intertrochanteric fracture involving the proximal left femur. Placement of IM nail and hip screw noted. Fracture fragments are in anatomic alignment. IMPRESSION: Status post ORIF of intertrochanteric fracture involving the proximal left femur. Electronically Signed   By: Waddell Calk M.D.   On: 04/12/2020 12:36   DG Chest 1 View Result Date: 04/11/2020 CLINICAL DATA:  Recent fall with known left hip fracture EXAM: CHEST  1 VIEW COMPARISON:  None. FINDINGS: Cardiac shadow is within normal limits. Aortic calcifications are noted. Mild scarring is noted in the bases bilaterally. No acute bony abnormality is seen. IMPRESSION: Mild bibasilar scarring.  No acute abnormality noted. Electronically Signed   By: Oneil Devonshire M.D.   On: 04/11/2020 13:34   DG Hip Unilat W or Wo Pelvis 2-3 Views Left Result Date: 04/11/2020 CLINICAL DATA:  Recent fall with leg pain, initial encounter EXAM: DG HIP (WITH OR WITHOUT PELVIS) 2-3V LEFT COMPARISON:  None. FINDINGS: Pelvic ring is intact. Left intratrochanteric fracture is noted with impaction and angulation at the fracture site. No other focal abnormality is noted. IMPRESSION: Left intratrochanteric fracture as described. Electronically  Signed  By: Oneil Devonshire M.D.   On: 04/11/2020 13:30    Assessment/Plan Dementia with Behavioral Disturbances   Significant decline in dementia necessitated move to skilled nursing facility for hospice-level care. Despite decline, he appears bright, happy, eating well, and communicating effectively. Continues to exhibit afternoon behavioral disturbances requiring pharmacological intervention. Informed consent obtained for Haldol  and Ativan , including discussion of risks, benefits, and alternatives. Haldol  preferred first-line treatment; Ativan  as second-line if Haldol  ineffective; Ativan  gel if oral medication refused.   - Administer Haldol  2.5 mg every 4 hours as needed for agitation.   - Administer Ativan  1 mg every 4 hours as needed for agitation if Haldol  is ineffective.   - Use Ativan  gel if oral medication is refused.   - Update all medication orders to reflect administration every 4 hours.    Hospice Care   Transitioned to hospice care due to advanced dementia. Focus on comfort and quality of life. Daughter reports he seems bright and happy in new location.   - Manage symptoms to ensure comfort.   - Provide supportive care and address new symptoms as they arise.  Family/ staff Communication: Daughter, nursing  Labs/tests ordered: none

## 2023-06-01 ENCOUNTER — Encounter: Payer: Self-pay | Admitting: Student

## 2023-06-10 ENCOUNTER — Non-Acute Institutional Stay (SKILLED_NURSING_FACILITY): Payer: Self-pay | Admitting: Student

## 2023-06-10 DIAGNOSIS — Z515 Encounter for palliative care: Secondary | ICD-10-CM | POA: Diagnosis not present

## 2023-06-10 NOTE — Progress Notes (Unsigned)
Location:      Place of Service:    Provider:  Judeth Horn, MD  Patient Care Team: Earnestine Mealing, MD as PCP - General [facility] Medicine)  Extended Emergency Contact Information Primary Emergency Contact: Smith,Kendal Address: [address]          [place], [place] [address] [place] of [place] Home Phone: [phone] Mobile Phone: [phone] Relation: Daughter  Code Status:  DNR Goals of care: Advanced Directive information    05/14/2023    8:50 AM  Advanced Directives  Does Patient Have a Medical Advance Directive? Yes  Type of Estate agent of [place];Out of facility DNR (pink MOST or yellow form);Living will  Does patient want to make changes to medical advance directive? No - Patient declined  Copy of Healthcare Power of Attorney in Chart? Yes - validated most recent copy scanned in chart (See row information)     No chief complaint on file.   HPI:  Pt is a 89 y.o. male seen today for an acute visit for Behaviors increasing. Fighting staff for caregiving in the afternoon. Requiring multiple PRN doses of haldol throughout the day.     Past Medical History:  Diagnosis Date   History of prostate cancer    RT 2006   Hx of colonic polyps    Hypertension    Mild cognitive impairment    OSA (obstructive sleep apnea)    on CPAP of 10   Rosacea    Tremor, essential    Wolff-Parkinson-White syndrome 1970   Past Surgical History:  Procedure Laterality Date   INTRAMEDULLARY (IM) NAIL INTERTROCHANTERIC Left 04/12/2020   Procedure: INTRAMEDULLARY (IM) NAIL INTERTROCHANTRIC;  Surgeon: Lyndle Herrlich, MD;  Location: [facility] ORS;  Service: Orthopedics;  Laterality: Left;   TONSILLECTOMY     TOTAL KNEE ARTHROPLASTY Left 3/14    Allergies  Allergen Reactions   Lisinopril     REACTION: cough   Lactose Intolerance (Gi) Diarrhea    Outpatient Encounter Medications as of 06/10/2023  Medication Sig   acetaminophen (TYLENOL) 500  MG tablet Take 1,000 mg by mouth every 8 (eight) hours as needed. For 14 days   cholestyramine (QUESTRAN) 4 g packet Take 4 g by mouth at bedtime.   guaifenesin (ROBITUSSIN) 100 MG/5ML syrup Take 100 mg by mouth every 4 (four) hours as needed for cough.   haloperidol (HALDOL) 2 MG/ML solution Take 2.5 mLs by mouth every 4 (four) hours as needed for agitation (x 6 months).   LORazepam (ATIVAN) 1 MG tablet Take 1 mg by mouth every 4 (four) hours as needed for anxiety.   Lorazepam POWD by Does not apply route. Apply to skin topically every 8 hours as needed for anxiety for 6 Months use only if unable to swallow tablet   No facility-administered encounter medications on file as of 06/10/2023.    Review of Systems  Immunization History  Administered Date(s) Administered   Influenza, High Dose Seasonal PF 02/07/2022   Influenza, Seasonal, Injecte, Preservative Fre 02/16/2016   Influenza-Unspecified 02/09/2014, 02/21/2015, 02/15/2023   Moderna Covid-19 Fall Seasonal Vaccine 100yrs & older 07/31/2022   Moderna SARS-COV2 Booster Vaccination 09/19/2021   Moderna Sars-Covid-2 Vaccination 09/09/2020, 03/02/2022   PFIZER Comirnaty(Gray Top)Covid-19 Tri-Sucrose Vaccine 03/02/2022   Pfizer Covid-19 Vaccine Bivalent Booster 49yrs & up 01/12/2021   Pneumococcal Conjugate-13 05/19/2014   Pneumococcal Polysaccharide-23 04/23/2005   Td 04/23/2005   Tetanus 05/27/2015   Zoster Recombinant(Shingrix) 08/13/2022, 11/13/2022   Zoster, Live 05/28/2013  Pertinent  Health Maintenance Due  Topic Date Due   INFLUENZA VACCINE  Completed      04/14/2020    1:00 AM 04/14/2020    8:00 AM 04/15/2020    3:00 AM 04/15/2020    7:56 AM 10/18/2022   10:13 AM  Fall Risk  Falls in the past year?     1  Was there an injury with Fall?     0  Fall Risk Category Calculator     2  (RETIRED) Patient Fall Risk Level High fall risk High fall risk High fall risk High fall risk   Patient at Risk for Falls Due to     History  of fall(s);Impaired mobility;Impaired vision  Fall risk Follow up     Falls evaluation completed   Functional Status Survey:    There were no vitals filed for this visit. There is no height or weight on file to calculate BMI. Physical Exam Constitutional:      Comments: At breakfast table looking around at other residents and reaching out to them at the table     Labs reviewed: Recent Labs    08/23/22 0000 02/27/23 0000  NA 137 138  K 3.5 3.4*  CL 104 102  CO2 26* 27*  BUN 25* 25*  CREATININE 1.1 1.0  CALCIUM 8.7 9.2   Recent Labs    08/23/22 0000 02/27/23 0000  AST 19 23  ALT 15 19  ALKPHOS 60 84  ALBUMIN 3.5 3.9   Recent Labs    08/23/22 0000 02/27/23 0000  WBC 7.1 8.8  NEUTROABS 3,749.00 5,315.00  HGB 12.4* 13.0*  HCT 37* 40*  PLT 229  --    Lab Results  Component Value Date   TSH 1.22 05/12/2013   No results found for: "HGBA1C" No results found for: "CHOL", "HDL", "LDLCALC", "LDLDIRECT", "TRIG", "CHOLHDL"  Significant Diagnostic Results in last 30 days:  No results found.  Assessment/Plan Hospice care Patient with increased behaviors despite PRN dosing. Will plan to schedule haldol 2.5 mg BID per request of nursing as well as hospice caregivers. Increase in behvaiors could be a reflection of overall decline. Weight stable. Interactive, however, unable to redirect when patient is upset. Will continue to monitor for symptoms. If further changes are necessary will contact family as he has been receiving medication 2x daily under the PRN dosing.   Family/ staff Communication: nursing  Labs/tests ordered:  none

## 2023-06-12 ENCOUNTER — Encounter: Payer: Self-pay | Admitting: Student

## 2023-07-17 ENCOUNTER — Encounter: Payer: Self-pay | Admitting: Student

## 2023-07-17 ENCOUNTER — Non-Acute Institutional Stay (SKILLED_NURSING_FACILITY): Payer: Self-pay | Admitting: Student

## 2023-07-17 DIAGNOSIS — N1832 Chronic kidney disease, stage 3b: Secondary | ICD-10-CM

## 2023-07-17 DIAGNOSIS — I10 Essential (primary) hypertension: Secondary | ICD-10-CM

## 2023-07-17 DIAGNOSIS — E440 Moderate protein-calorie malnutrition: Secondary | ICD-10-CM

## 2023-07-17 DIAGNOSIS — Z66 Do not resuscitate: Secondary | ICD-10-CM

## 2023-07-17 DIAGNOSIS — F01518 Vascular dementia, unspecified severity, with other behavioral disturbance: Secondary | ICD-10-CM

## 2023-07-17 NOTE — Progress Notes (Unsigned)
 Location:  Other [facility].  Nursing Home Room Number: [facility] 522A Place of Service:  SNF [phone]) Provider:  Earnestine Mealing, MD  Patient Care Team: Earnestine Mealing, MD as PCP - General [facility] Medicine)  Extended Emergency Contact Information Primary Emergency Contact: Smith,Kendal Address: [address]          [place], [place] [address] [place] of [place] Home Phone: [phone] Mobile Phone: [phone] Relation: Daughter  Code Status:  DNR Goals of care: Advanced Directive information    07/17/2023   10:42 AM  Advanced Directives  Does Patient Have a Medical Advance Directive? Yes  Type of Estate agent of [place];Out of facility DNR (pink MOST or yellow form);Living will  Does patient want to make changes to medical advance directive? No - Patient declined  Copy of Healthcare Power of Attorney in Chart? Yes - validated most recent copy scanned in chart (See row information)     Chief Complaint  Patient presents with   Medical Management of Chronic Issues    Medical Management of Chronic Issues.     HPI:  Pt is a 89 y.o. male seen today for medical management of chronic diseases.    Patient is sleeping and difficult to awaken at this time.   Nursing states patient has not had behaviors on her shift. Discussed concern that patient has too much medication if there is no documented behaviors. Encourage nursing to notify if patient cannot be redirected for care. Also discussed that patient has been hoyer dependent and encourage nursing ot use this for transfers given patient's continued decline and weakness.   Patient has lost 10 lb since December 2024. He receives support from hospice care.   Past Medical History:  Diagnosis Date   History of prostate cancer    RT 2006   Hx of colonic polyps    Hypertension    Mild cognitive impairment    OSA (obstructive sleep apnea)    on CPAP of 10   Rosacea    Tremor, essential     Wolff-Parkinson-White syndrome 1970   Past Surgical History:  Procedure Laterality Date   INTRAMEDULLARY (IM) NAIL INTERTROCHANTERIC Left 04/12/2020   Procedure: INTRAMEDULLARY (IM) NAIL INTERTROCHANTRIC;  Surgeon: Lyndle Herrlich, MD;  Location: [facility] ORS;  Service: Orthopedics;  Laterality: Left;   TONSILLECTOMY     TOTAL KNEE ARTHROPLASTY Left 3/14    Allergies  Allergen Reactions   Lisinopril     REACTION: cough   Lactose Intolerance (Gi) Diarrhea    Outpatient Encounter Medications as of 07/17/2023  Medication Sig   cholestyramine (QUESTRAN) 4 g packet Take 4 g by mouth at bedtime.   guaifenesin (ROBITUSSIN) 100 MG/5ML syrup Take 100 mg by mouth every 4 (four) hours as needed for cough.   haloperidol (HALDOL) 2 MG/ML solution Take 2.5 mLs by mouth every 6 (six) hours.   LORazepam (ATIVAN) 1 MG tablet Take 1 mg by mouth every 4 (four) hours as needed for anxiety.   acetaminophen (TYLENOL) 500 MG tablet Take 1,000 mg by mouth every 8 (eight) hours as needed. For 14 days (Patient not taking: Reported on 07/17/2023)   No facility-administered encounter medications on file as of 07/17/2023.    Review of Systems  Immunization History  Administered Date(s) Administered   Influenza, High Dose Seasonal PF 02/07/2022   Influenza, Seasonal, Injecte, Preservative Fre 02/16/2016   Influenza-Unspecified 02/09/2014, 02/21/2015, 02/15/2023   Moderna Covid-19 Fall Seasonal Vaccine 42yrs & older 07/31/2022   Moderna SARS-COV2  Booster Vaccination 09/19/2021   Moderna Sars-Covid-2 Vaccination 09/09/2020, 03/02/2022   PFIZER Comirnaty(Gray Top)Covid-19 Tri-Sucrose Vaccine 03/02/2022   Pfizer Covid-19 Vaccine Bivalent Booster 66yrs & up 01/12/2021   Pneumococcal Conjugate-13 05/19/2014   Pneumococcal Polysaccharide-23 04/23/2005   Td 04/23/2005   Tetanus 05/27/2015   Zoster Recombinant(Shingrix) 08/13/2022, 11/13/2022   Zoster, Live 05/28/2013   Pertinent  Health Maintenance Due  Topic  Date Due   INFLUENZA VACCINE  Completed      04/14/2020    1:00 AM 04/14/2020    8:00 AM 04/15/2020    3:00 AM 04/15/2020    7:56 AM 10/18/2022   10:13 AM  Fall Risk  Falls in the past year?     1  Was there an injury with Fall?     0  Fall Risk Category Calculator     2  (RETIRED) Patient Fall Risk Level High fall risk High fall risk High fall risk High fall risk   Patient at Risk for Falls Due to     History of fall(s);Impaired mobility;Impaired vision  Fall risk Follow up     Falls evaluation completed   Functional Status Survey:    Vitals:   07/17/23 1038 07/17/23 1043  BP: (!) 178/73 132/88  Pulse: 60   Resp: 16   Temp: 97.9 F (36.6 C)   SpO2: 98%   Weight: 142 lb 6.4 oz (64.6 kg)   Height: 5\' 10"  (1.778 m)    Body mass index is 20.43 kg/m. Physical Exam Constitutional:      Comments: Sleeping in recliner  Cardiovascular:     Rate and Rhythm: Normal rate.     Pulses: Normal pulses.  Pulmonary:     Effort: Pulmonary effort is normal.     Labs reviewed: Recent Labs    08/23/22 0000 02/27/23 0000  NA 137 138  K 3.5 3.4*  CL 104 102  CO2 26* 27*  BUN 25* 25*  CREATININE 1.1 1.0  CALCIUM 8.7 9.2   Recent Labs    08/23/22 0000 02/27/23 0000  AST 19 23  ALT 15 19  ALKPHOS 60 84  ALBUMIN 3.5 3.9   Recent Labs    08/23/22 0000 02/27/23 0000  WBC 7.1 8.8  NEUTROABS 3,749.00 5,315.00  HGB 12.4* 13.0*  HCT 37* 40*  PLT 229  --    Lab Results  Component Value Date   TSH 1.22 05/12/2013   No results found for: "HGBA1C" No results found for: "CHOL", "HDL", "LDLCALC", "LDLDIRECT", "TRIG", "CHOLHDL"  Significant Diagnostic Results in last 30 days:  No results found.  Assessment/Plan Vascular dementia with behavior disturbance (HCC)  Malnutrition of moderate degree (HCC)  Chronic kidney disease (CKD) stage G3b/A2, moderately decreased glomerular filtration rate (GFR) between 30-44 mL/min/1.73 square meter and albuminuria creatinine ratio  between 30-299 mg/g (HCC)  Essential hypertension, benign  DNR (do not resuscitate) Patient has had scheduled haldol for the last 2 weeks. Increased sedation. Nursing without concern for behavior. Minimal documented behaviors over the last 30 days. While in memory care patient was having daily behaviors and requiring haldol BID. Given heavy sedation and change in level behaviors, will plan for discontinuation of antipsychotics at this time. Significant weight loss in the last 3 months. Support feeding as indicated. Continue supportive care. No medications for BP at this time. Primary goal at this time is comfort measures. Continue hospice-level care.   Family/ staff Communication: Nursing, DON  Labs/tests ordered:  none

## 2023-07-18 ENCOUNTER — Encounter: Payer: Self-pay | Admitting: Student

## 2023-07-29 ENCOUNTER — Non-Acute Institutional Stay (SKILLED_NURSING_FACILITY): Payer: Self-pay | Admitting: Student

## 2023-07-29 DIAGNOSIS — H1013 Acute atopic conjunctivitis, bilateral: Secondary | ICD-10-CM | POA: Diagnosis not present

## 2023-07-29 NOTE — Progress Notes (Unsigned)
 Location:  Other Nursing Home Room Number: [facility] [address]A Place of Service:  SNF [phone]) Provider:  Ander Gaster, Benetta Spar, MD  Patient Care Team: Earnestine Mealing, MD as PCP - General [facility] Medicine)  Extended Emergency Contact Information Primary Emergency Contact: Smith,Kendal Address: [address]          [place], [place] [address] [place] of [place] Home Phone: [phone] Mobile Phone: [phone] Relation: Daughter  Code Status:  DNR Goals of care: Advanced Directive information    07/17/2023   10:42 AM  Advanced Directives  Does Patient Have a Medical Advance Directive? Yes  Type of Estate agent of [place];Out of facility DNR (pink MOST or yellow form);Living will  Does patient want to make changes to medical advance directive? No - Patient declined  Copy of Healthcare Power of Attorney in Chart? Yes - validated most recent copy scanned in chart (See row information)     Chief Complaint  Patient presents with   Eye Drainage         HPI:  Pt is a 89 y.o. male seen today for an acute visit for conjunctivitis.   Patient is resting comfortable in recliner. Sleeping deeply at this time despite attempts to rouse him.   Patient with notable behaviors, however, no PRN provided.    Past Medical History:  Diagnosis Date   History of prostate cancer    RT 2006   Hx of colonic polyps    Hypertension    Mild cognitive impairment    OSA (obstructive sleep apnea)    on CPAP of 10   Rosacea    Tremor, essential    Wolff-Parkinson-White syndrome 1970   Past Surgical History:  Procedure Laterality Date   INTRAMEDULLARY (IM) NAIL INTERTROCHANTERIC Left 04/12/2020   Procedure: INTRAMEDULLARY (IM) NAIL INTERTROCHANTRIC;  Surgeon: Lyndle Herrlich, MD;  Location: [facility] ORS;  Service: Orthopedics;  Laterality: Left;   TONSILLECTOMY     TOTAL KNEE ARTHROPLASTY Left 3/14    Allergies  Allergen Reactions   Lisinopril     REACTION:  cough   Lactose Intolerance (Gi) Diarrhea    Outpatient Encounter Medications as of 07/29/2023  Medication Sig   cholestyramine (QUESTRAN) 4 g packet Take 4 g by mouth at bedtime.   guaifenesin (ROBITUSSIN) 100 MG/5ML syrup Take 100 mg by mouth every 4 (four) hours as needed for cough.   LORazepam (ATIVAN) 1 MG tablet Take 1 mg by mouth every 4 (four) hours as needed for anxiety.   No facility-administered encounter medications on file as of 07/29/2023.    Review of Systems  Immunization History  Administered Date(s) Administered   Influenza, High Dose Seasonal PF 02/07/2022   Influenza, Seasonal, Injecte, Preservative Fre 02/16/2016   Influenza-Unspecified 02/09/2014, 02/21/2015, 02/15/2023   Moderna Covid-19 Fall Seasonal Vaccine 5yrs & older 07/31/2022   Moderna SARS-COV2 Booster Vaccination 09/19/2021   Moderna Sars-Covid-2 Vaccination 09/09/2020, 03/02/2022   PFIZER Comirnaty(Gray Top)Covid-19 Tri-Sucrose Vaccine 03/02/2022   Pfizer Covid-19 Vaccine Bivalent Booster 105yrs & up 01/12/2021   Pneumococcal Conjugate-13 05/19/2014   Pneumococcal Polysaccharide-23 04/23/2005   Td 04/23/2005   Tetanus 05/27/2015   Zoster Recombinant(Shingrix) 08/13/2022, 11/13/2022   Zoster, Live 05/28/2013   Pertinent  Health Maintenance Due  Topic Date Due   INFLUENZA VACCINE  11/22/2023      04/14/2020    1:00 AM 04/14/2020    8:00 AM 04/15/2020    3:00 AM 04/15/2020    7:56 AM 10/18/2022   10:13 AM  Fall Risk  Falls in the past year?     1  Was there an injury with Fall?     0  Fall Risk Category Calculator     2  (RETIRED) Patient Fall Risk Level High fall risk High fall risk High fall risk High fall risk   Patient at Risk for Falls Due to     History of fall(s);Impaired mobility;Impaired vision  Fall risk Follow up     Falls evaluation completed   Functional Status Survey:    Vitals:   07/29/23 1947  BP: (!) 158/68  Resp: 16  Temp: 97.6 F (36.4 C)  SpO2: 99%  Weight: 137  lb 9.6 oz (62.4 kg)   Body mass index is 19.74 kg/m. Physical Exam Constitutional:      General: He is not in acute distress. Eyes:     General:        Right eye: Discharge present.        Left eye: Discharge present.    Comments: Crusting present in the eyes     Labs reviewed: Recent Labs    08/23/22 0000 02/27/23 0000  NA 137 138  K 3.5 3.4*  CL 104 102  CO2 26* 27*  BUN 25* 25*  CREATININE 1.1 1.0  CALCIUM 8.7 9.2   Recent Labs    08/23/22 0000 02/27/23 0000  AST 19 23  ALT 15 19  ALKPHOS 60 84  ALBUMIN 3.5 3.9   Recent Labs    08/23/22 0000 02/27/23 0000  WBC 7.1 8.8  NEUTROABS 3,749.00 5,315.00  HGB 12.4* 13.0*  HCT 37* 40*  PLT 229  --    Lab Results  Component Value Date   TSH 1.22 05/12/2013   No results found for: "HGBA1C" No results found for: "CHOL", "HDL", "LDLCALC", "LDLDIRECT", "TRIG", "CHOLHDL"  Significant Diagnostic Results in last 30 days:  No results found.  Assessment/Plan Acute atopic conjunctivitis of both eyes New evidence of drainage and crusting. Likely due to allergies at this time of year. Will plan to start eye scrub and daily zyrtec. If no improvement, will consider change in eye drops.   Family/ staff Communication: nursing  Labs/tests ordered:  none

## 2023-07-30 ENCOUNTER — Encounter: Payer: Self-pay | Admitting: Student

## 2023-08-08 ENCOUNTER — Non-Acute Institutional Stay (SKILLED_NURSING_FACILITY): Payer: Self-pay | Admitting: Nurse Practitioner

## 2023-08-08 ENCOUNTER — Encounter: Payer: Self-pay | Admitting: Nurse Practitioner

## 2023-08-08 DIAGNOSIS — I10 Essential (primary) hypertension: Secondary | ICD-10-CM | POA: Diagnosis not present

## 2023-08-08 DIAGNOSIS — K529 Noninfective gastroenteritis and colitis, unspecified: Secondary | ICD-10-CM

## 2023-08-08 DIAGNOSIS — I350 Nonrheumatic aortic (valve) stenosis: Secondary | ICD-10-CM | POA: Diagnosis not present

## 2023-08-08 DIAGNOSIS — F01518 Vascular dementia, unspecified severity, with other behavioral disturbance: Secondary | ICD-10-CM

## 2023-08-08 DIAGNOSIS — F39 Unspecified mood [affective] disorder: Secondary | ICD-10-CM

## 2023-08-08 DIAGNOSIS — N1832 Chronic kidney disease, stage 3b: Secondary | ICD-10-CM

## 2023-08-08 DIAGNOSIS — E440 Moderate protein-calorie malnutrition: Secondary | ICD-10-CM | POA: Diagnosis not present

## 2023-08-08 NOTE — Progress Notes (Signed)
 Location:  Other Nursing Home Room Number: 522a Place of Service:  SNF ([phone])  Valrie Gehrig, MD  Patient Care Team: Valrie Gehrig, MD as PCP - General [facility] Medicine)  Extended Emergency Contact Information Primary Emergency Contact: Smith,Kendal Address: [address]          [place], [place] [address] [place] Home Phone: [phone] Mobile Phone: [phone] Relation: Daughter  Goals of care: Advanced Directive information    07/17/2023   10:42 AM  Advanced Directives  Does Patient Have a Medical Advance Directive? Yes  Type of Estate agent of [place];Out of facility DNR (pink MOST or yellow form);Living will  Does patient want to make changes to medical advance directive? No - Patient declined  Copy of Healthcare Power of Attorney in Chart? Yes - validated most recent copy scanned in chart (See row information)     Chief Complaint  Patient presents with   Medical Management of Chronic Issues    HPI:  Pt is a 89 y.o. male seen today for medical management of chronic disease.  Pt is at [facility] for long term care.  He is under hospice care.  Recently seen and treated for conjunctivitis, continues to have some crusting of eye but using eye scrub daily No increase in behaviors since medication has been stopped and without use of PRN ativan He is lethargic most of the time.  He is total care and needs to be fed.  Nursing without acute concerns.    Past Medical History:  Diagnosis Date   History of prostate cancer    RT 2006   Hx of colonic polyps    Hypertension    Mild cognitive impairment    OSA (obstructive sleep apnea)    on CPAP of 10   Rosacea    Tremor, essential    Wolff-Parkinson-White syndrome 1970   Past Surgical History:  Procedure Laterality Date   INTRAMEDULLARY (IM) NAIL INTERTROCHANTERIC Left 04/12/2020   Procedure: INTRAMEDULLARY (IM) NAIL INTERTROCHANTRIC;  Surgeon: Jerlyn Moons, MD;   Location: [facility] ORS;  Service: Orthopedics;  Laterality: Left;   TONSILLECTOMY     TOTAL KNEE ARTHROPLASTY Left 3/14    Allergies  Allergen Reactions   Lisinopril     REACTION: cough   Lactose Intolerance (Gi) Diarrhea    Outpatient Encounter Medications as of 08/08/2023  Medication Sig   cholestyramine (QUESTRAN) 4 g packet Take 4 g by mouth at bedtime.   guaifenesin (ROBITUSSIN) 100 MG/5ML syrup Take 100 mg by mouth every 4 (four) hours as needed for cough.   LORazepam (ATIVAN) 1 MG tablet Take 1 mg by mouth every 4 (four) hours as needed for anxiety.   No facility-administered encounter medications on file as of 08/08/2023.    Review of Systems  Unable to perform ROS: Dementia     Immunization History  Administered Date(s) Administered   Influenza, High Dose Seasonal PF 02/07/2022   Influenza, Seasonal, Injecte, Preservative Fre 02/16/2016   Influenza-Unspecified 02/09/2014, 02/21/2015, 02/15/2023   Moderna Covid-19 Fall Seasonal Vaccine 51yrs & older 07/31/2022   Moderna SARS-COV2 Booster Vaccination 09/19/2021   Moderna Sars-Covid-2 Vaccination 09/09/2020, 03/02/2022   PFIZER Comirnaty(Gray Top)Covid-19 Tri-Sucrose Vaccine 03/02/2022   Pfizer Covid-19 Vaccine Bivalent Booster 73yrs & up 01/12/2021   Pneumococcal Conjugate-13 05/19/2014   Pneumococcal Polysaccharide-23 04/23/2005   Td 04/23/2005   Tetanus 05/27/2015   Zoster Recombinant(Shingrix) 08/13/2022, 11/13/2022   Zoster, Live 05/28/2013   Pertinent  Health Maintenance Due  Topic Date  Due   INFLUENZA VACCINE  11/22/2023      04/14/2020    1:00 AM 04/14/2020    8:00 AM 04/15/2020    3:00 AM 04/15/2020    7:56 AM 10/18/2022   10:13 AM  Fall Risk  Falls in the past year?     1  Was there an injury with Fall?     0  Fall Risk Category Calculator     2  (RETIRED) Patient Fall Risk Level High fall risk High fall risk High fall risk High fall risk   Patient at Risk for Falls Due to     History of  fall(s);Impaired mobility;Impaired vision  Fall risk Follow up     Falls evaluation completed   Functional Status Survey:    Vitals:   08/08/23 1527  BP: (!) 158/89  Pulse: 63  Resp: 20  Temp: 97.6 F (36.4 C)  Weight: 131 lb (59.4 kg)   Body mass index is 18.8 kg/m. Physical Exam Constitutional:      General: He is not in acute distress.    Appearance: He is well-developed. He is not diaphoretic.  HENT:     Head: Normocephalic and atraumatic.     Right Ear: External ear normal.     Left Ear: External ear normal.     Mouth/Throat:     Pharynx: No oropharyngeal exudate.  Eyes:     Conjunctiva/sclera: Conjunctivae normal.     Pupils: Pupils are equal, round, and reactive to light.  Cardiovascular:     Rate and Rhythm: Normal rate and regular rhythm.     Heart sounds: Murmur heard.  Pulmonary:     Effort: Pulmonary effort is normal.     Breath sounds: Normal breath sounds.  Abdominal:     General: Bowel sounds are normal.     Palpations: Abdomen is soft.  Musculoskeletal:        General: No tenderness.     Cervical back: Normal range of motion and neck supple.     Right lower leg: No edema.     Left lower leg: No edema.  Skin:    General: Skin is warm and dry.  Neurological:     Mental Status: He is alert and oriented to person, place, and time.     Labs reviewed: Recent Labs    08/23/22 0000 02/27/23 0000  NA 137 138  K 3.5 3.4*  CL 104 102  CO2 26* 27*  BUN 25* 25*  CREATININE 1.1 1.0  CALCIUM 8.7 9.2   Recent Labs    08/23/22 0000 02/27/23 0000  AST 19 23  ALT 15 19  ALKPHOS 60 84  ALBUMIN 3.5 3.9   Recent Labs    08/23/22 0000 02/27/23 0000  WBC 7.1 8.8  NEUTROABS 3,749.00 5,315.00  HGB 12.4* 13.0*  HCT 37* 40*  PLT 229  --    Lab Results  Component Value Date   TSH 1.22 05/12/2013   No results found for: "HGBA1C" No results found for: "CHOL", "HDL", "LDLCALC", "LDLDIRECT", "TRIG", "CHOLHDL"  Significant Diagnostic Results in  last 30 days:  No results found.  Assessment/Plan 1. Essential hypertension, benign (Primary) Elevated blood pressures readings noted, he is on hospice services and comfort measures only  2. Aortic valve stenosis, etiology of cardiac valve disease unspecified Stable, without increase in shortness of breath or edeam  3. Vascular dementia with behavior disturbance (HCC) Advanced, continues under hospice and supportive care  4. Malnutrition of moderate degree (HCC)  Weight loss noted, suspect this to worsen due to advanced dementia  5. Chronic kidney disease (CKD) stage G3b/A2, moderately decreased glomerular filtration rate (GFR) between 30-44 mL/min/1.73 square meter and albuminuria creatinine ratio between 30-299 mg/g (HCC) -noted, continue to monitor  6. Mood disorder (HCC) Overall controlled off medication.   7. Chronic diarrhea -well controlled on questran     Arney Mayabb K. Denney [facility] [phone]

## 2023-09-11 ENCOUNTER — Non-Acute Institutional Stay (SKILLED_NURSING_FACILITY): Payer: Self-pay | Admitting: Student

## 2023-09-11 ENCOUNTER — Encounter: Payer: Self-pay | Admitting: Student

## 2023-09-11 DIAGNOSIS — N1831 Chronic kidney disease, stage 3a: Secondary | ICD-10-CM | POA: Diagnosis not present

## 2023-09-11 DIAGNOSIS — I700 Atherosclerosis of aorta: Secondary | ICD-10-CM

## 2023-09-11 DIAGNOSIS — F01B0 Vascular dementia, moderate, without behavioral disturbance, psychotic disturbance, mood disturbance, and anxiety: Secondary | ICD-10-CM | POA: Diagnosis not present

## 2023-09-11 DIAGNOSIS — E440 Moderate protein-calorie malnutrition: Secondary | ICD-10-CM

## 2023-09-11 DIAGNOSIS — I10 Essential (primary) hypertension: Secondary | ICD-10-CM

## 2023-09-11 NOTE — Progress Notes (Unsigned)
 Location:  Other [facility].  Nursing Home Room Number: [facility] 522A Place of Service:  SNF [phone]) Provider:  Valrie Gehrig, MD  Patient Care Team: Valrie Gehrig, MD as PCP - General [facility] Medicine)  Extended Emergency Contact Information Primary Emergency Contact: Smith,Kendal Address: [address]          [place], [place] [address] [place] Home Phone: [phone] Mobile Phone: [phone] Relation: Daughter  Code Status:  DNR Goals of care: Advanced Directive information    07/17/2023   10:42 AM  Advanced Directives  Does Patient Have a Medical Advance Directive? Yes  Type of Estate agent of [place];Out of facility DNR (pink MOST or yellow form);Living will  Does patient want to make changes to medical advance directive? No - Patient declined  Copy of Healthcare Power of Attorney in Chart? Yes - validated most recent copy scanned in chart (See row information)     Chief Complaint  Patient presents with   Medical Management of Chronic Issues    Medical Management of Chronic Issues.     HPI:  Pt is a 89 y.o. male seen today for medical management of chronic diseases.  Patient has had less issues with behaviors. Sleeps the majority of the morning per nursing.   Patient smiles and attempts to mouth some words, but does not make any words out.   Patient has had significant functional and cognitive decline and is now on hospice.    Past Medical History:  Diagnosis Date   History of prostate cancer    RT 2006   Hx of colonic polyps    Hypertension    Mild cognitive impairment    OSA (obstructive sleep apnea)    on CPAP of 10   Rosacea    Tremor, essential    Wolff-Parkinson-White syndrome 1970   Past Surgical History:  Procedure Laterality Date   INTRAMEDULLARY (IM) NAIL INTERTROCHANTERIC Left 04/12/2020   Procedure: INTRAMEDULLARY (IM) NAIL INTERTROCHANTRIC;  Surgeon: Jerlyn Moons, MD;  Location: [facility] ORS;   Service: Orthopedics;  Laterality: Left;   TONSILLECTOMY     TOTAL KNEE ARTHROPLASTY Left 3/14    Allergies  Allergen Reactions   Lisinopril     REACTION: cough   Lactose Intolerance (Gi) Diarrhea    Outpatient Encounter Medications as of 09/11/2023  Medication Sig   cetirizine (ZYRTEC) 5 MG tablet Take 5 mg by mouth daily.   cholestyramine  (QUESTRAN ) 4 g packet Take 4 g by mouth at bedtime.   Eyelid Cleansers (OCUSOFT LID SCRUB EX) Apply to bilateral eyelids topically in the morning.   guaifenesin (ROBITUSSIN) 100 MG/5ML syrup Take 100 mg by mouth every 4 (four) hours as needed for cough.   Olopatadine HCl (PATADAY) 0.2 % SOLN Apply to eye. Instill one drop both eye once daily.   LORazepam  (ATIVAN ) 1 MG tablet Take 1 mg by mouth every 4 (four) hours as needed for anxiety.   No facility-administered encounter medications on file as of 09/11/2023.    Review of Systems  Immunization History  Administered Date(s) Administered   Influenza, High Dose Seasonal PF 02/07/2022   Influenza, Seasonal, Injecte, Preservative Fre 02/16/2016   Influenza-Unspecified 02/09/2014, 02/21/2015, 02/15/2023   Moderna Covid-19 Fall Seasonal Vaccine 58yrs & older 07/31/2022   Moderna SARS-COV2 Booster Vaccination 09/19/2021   Moderna Sars-Covid-2 Vaccination 09/09/2020, 03/02/2022   PFIZER Comirnaty(Gray Top)Covid-19 Tri-Sucrose Vaccine 03/02/2022   Pfizer Covid-19 Vaccine Bivalent Booster 69yrs & up 01/12/2021   Pneumococcal Conjugate-13 05/19/2014  Pneumococcal Polysaccharide-23 04/23/2005   Td 04/23/2005   Tetanus 05/27/2015   Zoster Recombinant(Shingrix) 08/13/2022, 11/13/2022   Zoster, Live 05/28/2013   Pertinent  Health Maintenance Due  Topic Date Due   INFLUENZA VACCINE  11/22/2023      04/14/2020    1:00 AM 04/14/2020    8:00 AM 04/15/2020    3:00 AM 04/15/2020    7:56 AM 10/18/2022   10:13 AM  Fall Risk  Falls in the past year?     1  Was there an injury with Fall?     0  Fall  Risk Category Calculator     2  (RETIRED) Patient Fall Risk Level High fall risk High fall risk High fall risk High fall risk   Patient at Risk for Falls Due to     History of fall(s);Impaired mobility;Impaired vision  Fall risk Follow up     Falls evaluation completed   Functional Status Survey:    Vitals:   09/11/23 1129 09/11/23 1134  BP: (!) 168/93 (!) 184/87  Pulse: 85   Resp: 20   Temp: (!) 97.2 F (36.2 C)   SpO2: 95%   Weight: 133 lb 3.2 oz (60.4 kg)   Height: 5\' 10"  (1.778 m)    Body mass index is 19.11 kg/m. Physical Exam Constitutional:      Comments: Thin, Frail  Cardiovascular:     Rate and Rhythm: Normal rate.     Pulses: Normal pulses.  Neurological:     Mental Status: He is disoriented.     Labs reviewed: Recent Labs    02/27/23 0000  NA 138  K 3.4*  CL 102  CO2 27*  BUN 25*  CREATININE 1.0  CALCIUM 9.2   Recent Labs    02/27/23 0000  AST 23  ALT 19  ALKPHOS 84  ALBUMIN 3.9   Recent Labs    02/27/23 0000  WBC 8.8  NEUTROABS 5,315.00  HGB 13.0*  HCT 40*   Lab Results  Component Value Date   TSH 1.22 05/12/2013   No results found for: "HGBA1C" No results found for: "CHOL", "HDL", "LDLCALC", "LDLDIRECT", "TRIG", "CHOLHDL"  Significant Diagnostic Results in last 30 days:  No results found.  Assessment/Plan Stage 3a chronic kidney disease (HCC)  Moderate vascular dementia without behavioral disturbance, psychotic disturbance, mood disturbance, or anxiety (HCC), Chronic  Atherosclerosis of aorta (HCC)  Essential hypertension, benign  Malnutrition of moderate degree Patient with dementia and continued decline. Only comfort medications at this time. Continue supportive care. Blood pressure elevated, however, no BP meds at this time. No distress or discomfort at this time.   Family/ staff Communication: nursing  Labs/tests ordered:  none

## 2023-09-19 ENCOUNTER — Encounter: Payer: Self-pay | Admitting: Student

## 2023-10-29 ENCOUNTER — Non-Acute Institutional Stay (SKILLED_NURSING_FACILITY): Admitting: Nurse Practitioner

## 2023-10-29 ENCOUNTER — Encounter: Payer: Self-pay | Admitting: Nurse Practitioner

## 2023-10-29 DIAGNOSIS — E440 Moderate protein-calorie malnutrition: Secondary | ICD-10-CM | POA: Diagnosis not present

## 2023-10-29 DIAGNOSIS — I10 Essential (primary) hypertension: Secondary | ICD-10-CM

## 2023-10-29 DIAGNOSIS — F01B0 Vascular dementia, moderate, without behavioral disturbance, psychotic disturbance, mood disturbance, and anxiety: Secondary | ICD-10-CM | POA: Diagnosis not present

## 2023-10-29 DIAGNOSIS — I350 Nonrheumatic aortic (valve) stenosis: Secondary | ICD-10-CM

## 2023-10-29 DIAGNOSIS — F39 Unspecified mood [affective] disorder: Secondary | ICD-10-CM

## 2023-10-29 DIAGNOSIS — K529 Noninfective gastroenteritis and colitis, unspecified: Secondary | ICD-10-CM | POA: Insufficient documentation

## 2023-10-29 NOTE — Assessment & Plan Note (Signed)
 Asymptomatic, no shortness of breath or edema noted

## 2023-10-29 NOTE — Assessment & Plan Note (Signed)
 Not currently on medication

## 2023-10-29 NOTE — Assessment & Plan Note (Signed)
 Controlled on questran.

## 2023-10-29 NOTE — Progress Notes (Signed)
 Location:  Other [facility].  Nursing Home Room Number: [facility]A Place of Service:  SNF [phone]) Harlene An, NP  ERE:Azjfzm, Richerd, MD  Patient Care Team: Abdul Richerd, MD as PCP - General [facility] Medicine)  Extended Emergency Contact Information Primary Emergency Contact: Smith,Kendal Address: [address]          [place], [place] [address] [place] Home Phone: [phone] Mobile Phone: [phone] Relation: Daughter  Goals of care: Advanced Directive information    10/29/2023   10:40 AM  Advanced Directives  Does Patient Have a Medical Advance Directive? Yes  Type of Estate agent of [place];Living will;Out of facility DNR (pink MOST or yellow form)  Does patient want to make changes to medical advance directive? No - Patient declined  Copy of Healthcare Power of Attorney in Chart? Yes - validated most recent copy scanned in chart (See row information)     Chief Complaint  Patient presents with   Medical Management of Chronic Issues    Medical Management of Chronic Issues.     HPI:  Pt is a 89 y.o. male seen today for medical management of chronic disease. Pt with advanced dementia. Pt under hospice care and requires total care.  He is eating well with help of staff.  No issues with constipation noted.  Pt without shortness of breath or LE edema.  Does not complain of pain.  There has been no behaviors noted    Past Medical History:  Diagnosis Date   History of prostate cancer    RT 2006   Hx of colonic polyps    Hypertension    Mild cognitive impairment    OSA (obstructive sleep apnea)    on CPAP of 10   Rosacea    Tremor, essential    Wolff-Parkinson-White syndrome 1970   Past Surgical History:  Procedure Laterality Date   INTRAMEDULLARY (IM) NAIL INTERTROCHANTERIC Left 04/12/2020   Procedure: INTRAMEDULLARY (IM) NAIL INTERTROCHANTRIC;  Surgeon: Leora Lynwood SAUNDERS, MD;  Location: [facility] ORS;   Service: Orthopedics;  Laterality: Left;   TONSILLECTOMY     TOTAL KNEE ARTHROPLASTY Left 3/14    Allergies  Allergen Reactions   Lisinopril     REACTION: cough   Lactose Intolerance (Gi) Diarrhea    Outpatient Encounter Medications as of 10/29/2023  Medication Sig   cetirizine (ZYRTEC) 5 MG tablet Take 5 mg by mouth daily.   cholestyramine  (QUESTRAN ) 4 g packet Take 4 g by mouth at bedtime.   Eyelid Cleansers (OCUSOFT LID SCRUB EX) Apply to bilateral eyelids topically in the morning.   guaifenesin (ROBITUSSIN) 100 MG/5ML syrup Take 100 mg by mouth every 4 (four) hours as needed for cough.   Olopatadine HCl (PATADAY) 0.2 % SOLN Apply to eye. Instill one drop both eye once daily.   LORazepam  (ATIVAN ) 1 MG tablet Take 1 mg by mouth every 4 (four) hours as needed for anxiety. (Patient not taking: Reported on 10/29/2023)   No facility-administered encounter medications on file as of 10/29/2023.    Review of Systems  Unable to perform ROS: Dementia     Immunization History  Administered Date(s) Administered   Influenza, High Dose Seasonal PF 02/07/2022   Influenza, Seasonal, Injecte, Preservative Fre 02/16/2016   Influenza-Unspecified 02/09/2014, 02/21/2015, 02/15/2023   Moderna Covid-19 Fall Seasonal Vaccine 26yrs & older 07/31/2022   Moderna SARS-COV2 Booster Vaccination 09/19/2021   Moderna Sars-Covid-2 Vaccination 09/09/2020, 03/02/2022   PFIZER Comirnaty(Gray Top)Covid-19 Tri-Sucrose Vaccine 03/02/2022  [employer] officer, [employer] 33yrs & up 01/12/2021   Pneumococcal Conjugate-13 05/19/2014   Pneumococcal Polysaccharide-23 04/23/2005   Td 04/23/2005   Tetanus 05/27/2015   Zoster Recombinant(Shingrix) 08/13/2022, 11/13/2022   Zoster, Live 05/28/2013   Pertinent  Health Maintenance Due  Topic Date Due   INFLUENZA VACCINE  11/22/2023      04/14/2020    1:00 AM 04/14/2020    8:00 AM 04/15/2020    3:00 AM 04/15/2020    7:56 AM 10/18/2022   10:13 AM  Fall Risk   Falls in the past year?     1  Was there an injury with Fall?     0  Fall Risk Category Calculator     2  (RETIRED) Patient Fall Risk Level High fall risk  High fall risk  High fall risk  High fall risk    Patient at Risk for Falls Due to     History of fall(s);Impaired mobility;Impaired vision  Fall risk Follow up     Falls evaluation completed     Data saved with a previous flowsheet row definition   Functional Status Survey:    Vitals:   10/29/23 1032 10/29/23 1042  BP: (!) 177/80 (!) 148/81  Pulse: 63   Resp: 16   Temp: (!) 97.4 F (36.3 C)   SpO2: 93%   Weight: 120 lb 3.2 oz (54.5 kg)   Height: 5' 10 (1.778 m)    Body mass index is 17.25 kg/m. Physical Exam Constitutional:      General: He is not in acute distress.    Appearance: He is well-developed. He is not diaphoretic.  HENT:     Head: Normocephalic and atraumatic.     Right Ear: External ear normal.     Left Ear: External ear normal.     Mouth/Throat:     Pharynx: No oropharyngeal exudate.  Eyes:     Conjunctiva/sclera: Conjunctivae normal.     Pupils: Pupils are equal, round, and reactive to light.  Cardiovascular:     Rate and Rhythm: Normal rate and regular rhythm.     Heart sounds: Murmur heard.  Pulmonary:     Effort: Pulmonary effort is normal.     Breath sounds: Normal breath sounds.  Abdominal:     General: Bowel sounds are normal.     Palpations: Abdomen is soft.  Musculoskeletal:        General: No tenderness.     Cervical back: Normal range of motion and neck supple.     Right lower leg: No edema.     Left lower leg: No edema.  Skin:    General: Skin is warm and dry.  Neurological:     Mental Status: He is alert and oriented to person, place, and time.     Labs reviewed: Recent Labs    02/27/23 0000  NA 138  K 3.4*  CL 102  CO2 27*  BUN 25*  CREATININE 1.0  CALCIUM 9.2   Recent Labs    02/27/23 0000  AST 23  ALT 19  ALKPHOS 84  ALBUMIN 3.9   Recent Labs     02/27/23 0000  WBC 8.8  NEUTROABS 5,315.00  HGB 13.0*  HCT 40*   Lab Results  Component Value Date   TSH 1.22 05/12/2013   No results found for: HGBA1C No results found for: CHOL, HDL, LDLCALC, LDLDIRECT, TRIG, CHOLHDL  Significant Diagnostic Results in last 30 days:  No results found.  Assessment/Plan Aortic stenosis  Asymptomatic, no shortness of breath or edema noted  Chronic diarrhea Controlled on questran   Essential hypertension, benign Not currently on medication.   Malnutrition of moderate degree Continue with supportive care  Moderate vascular dementia without behavioral disturbance, psychotic disturbance, mood disturbance, or anxiety (HCC) Progressive decline, continues on hospice.   Mood disorder (HCC) Controlled at this time.     Laurice Iglesia K. Caro BODILY [facility] [phone]

## 2023-10-29 NOTE — Assessment & Plan Note (Signed)
 Progressive decline, continues on hospice.

## 2023-10-29 NOTE — Assessment & Plan Note (Signed)
 Continue with supportive care.

## 2023-10-29 NOTE — Assessment & Plan Note (Signed)
 Controlled at this time

## 2023-11-08 ENCOUNTER — Non-Acute Institutional Stay (SKILLED_NURSING_FACILITY): Payer: Self-pay | Admitting: Student

## 2023-11-08 DIAGNOSIS — R4689 Other symptoms and signs involving appearance and behavior: Secondary | ICD-10-CM

## 2023-11-08 DIAGNOSIS — F01B0 Vascular dementia, moderate, without behavioral disturbance, psychotic disturbance, mood disturbance, and anxiety: Secondary | ICD-10-CM | POA: Diagnosis not present

## 2023-11-09 ENCOUNTER — Encounter: Payer: Self-pay | Admitting: Student

## 2023-11-09 NOTE — Progress Notes (Signed)
 Location:  Other Nursing Home [facility]: [facility] - [address] Place of Service:  SNF [phone]) Provider:  Abdul Abdul, Richerd, MD  Patient Care Team: Abdul Richerd, MD as PCP - General [facility] Medicine)  Extended Emergency Contact Information Primary Emergency Contact: Smith,Kendal Address: [address]          [place], [place] [address] [place] Home Phone: [phone] Mobile Phone: [phone] Relation: Daughter  Code Status:  DNR Goals of care: Advanced Directive information    10/29/2023   10:40 AM  Advanced Directives  Does Patient Have a Medical Advance Directive? Yes  Type of Estate agent of [place];Living will;Out of facility DNR (pink MOST or yellow form)  Does patient want to make changes to medical advance directive? No - Patient declined  Copy of Healthcare Power of Attorney in Chart? Yes - validated most recent copy scanned in chart (See row information)     Chief Complaint  Patient presents with   Fussy    HPI:  Pt is a 89 y.o. male seen today for an acute visit for behavioral concerns:  History of Present Illness The patient, with dementia, presents with increased symptoms during evening care.  He has been experiencing increased symptoms of dementia, particularly during evening care, with notable difficulty swallowing. This issue is more pronounced at night, as observed when a caregiver attempted to give him something to drink, and he still had something in his mouth.  Previously, he was on Haldol  administered every six hours, but there were concerns about over-sedation. It was noted that he did not have problems during the day, but symptoms were more pronounced at night.  He has been generally cooperative with taking oral medications, although there was an incident where he was uncooperative, which was unusual for him. His caregiver mentioned that he usually opens his mouth to take medications without  issue.   Past Medical History:  Diagnosis Date   History of prostate cancer    RT 2006   Hx of colonic polyps    Hypertension    Mild cognitive impairment    OSA (obstructive sleep apnea)    on CPAP of 10   Rosacea    Tremor, essential    Wolff-Parkinson-White syndrome 1970   Past Surgical History:  Procedure Laterality Date   INTRAMEDULLARY (IM) NAIL INTERTROCHANTERIC Left 04/12/2020   Procedure: INTRAMEDULLARY (IM) NAIL INTERTROCHANTRIC;  Surgeon: Leora Lynwood SAUNDERS, MD;  Location: [facility] ORS;  Service: Orthopedics;  Laterality: Left;   TONSILLECTOMY     TOTAL KNEE ARTHROPLASTY Left 3/14    Allergies  Allergen Reactions   Lisinopril     REACTION: cough   Lactose Intolerance (Gi) Diarrhea    Outpatient Encounter Medications as of 11/08/2023  Medication Sig   cetirizine (ZYRTEC) 5 MG tablet Take 5 mg by mouth daily.   cholestyramine  (QUESTRAN ) 4 g packet Take 4 g by mouth at bedtime.   Eyelid Cleansers (OCUSOFT LID SCRUB EX) Apply to bilateral eyelids topically in the morning.   guaifenesin (ROBITUSSIN) 100 MG/5ML syrup Take 100 mg by mouth every 4 (four) hours as needed for cough.   LORazepam  (ATIVAN ) 1 MG tablet Take 1 mg by mouth every 4 (four) hours as needed for anxiety. (Patient not taking: Reported on 10/29/2023)   Olopatadine HCl (PATADAY) 0.2 % SOLN Apply to eye. Instill one drop both eye once daily.   No facility-administered encounter medications on file as of 11/08/2023.    Review  of Systems  Immunization History  Administered Date(s) Administered   Influenza, High Dose Seasonal PF 02/07/2022   Influenza, Seasonal, Injecte, Preservative Fre 02/16/2016   Influenza-Unspecified 02/09/2014, 02/21/2015, 02/15/2023   Moderna Covid-19 Fall Seasonal Vaccine 32yrs & older 07/31/2022   Moderna SARS-COV2 Booster Vaccination 09/19/2021   Moderna Sars-Covid-2 Vaccination 09/09/2020, 03/02/2022   PFIZER Comirnaty(Gray Top)Covid-19 Tri-Sucrose Vaccine 03/02/2022   Pfizer  Covid-19 Vaccine Bivalent Booster 20yrs & up 01/12/2021   Pneumococcal Conjugate-13 05/19/2014   Pneumococcal Polysaccharide-23 04/23/2005   Td 04/23/2005   Tetanus 05/27/2015   Zoster Recombinant(Shingrix) 08/13/2022, 11/13/2022   Zoster, Live 05/28/2013   Pertinent  Health Maintenance Due  Topic Date Due   INFLUENZA VACCINE  11/22/2023      04/14/2020    1:00 AM 04/14/2020    8:00 AM 04/15/2020    3:00 AM 04/15/2020    7:56 AM 10/18/2022   10:13 AM  Fall Risk  Falls in the past year?     1  Was there an injury with Fall?     0  Fall Risk Category Calculator     2  (RETIRED) Patient Fall Risk Level High fall risk  High fall risk  High fall risk  High fall risk    Patient at Risk for Falls Due to     History of fall(s);Impaired mobility;Impaired vision  Fall risk Follow up     Falls evaluation completed     Data saved with a previous flowsheet row definition   Functional Status Survey:    There were no vitals filed for this visit. There is no height or weight on file to calculate BMI. Physical Exam Constitutional:      Comments: Resting in bed      Labs reviewed: Recent Labs    02/27/23 0000  NA 138  K 3.4*  CL 102  CO2 27*  BUN 25*  CREATININE 1.0  CALCIUM 9.2   Recent Labs    02/27/23 0000  AST 23  ALT 19  ALKPHOS 84  ALBUMIN 3.9   Recent Labs    02/27/23 0000  WBC 8.8  NEUTROABS 5,315.00  HGB 13.0*  HCT 40*   Lab Results  Component Value Date   TSH 1.22 05/12/2013   No results found for: HGBA1C No results found for: CHOL, HDL, LDLCALC, LDLDIRECT, TRIG, CHOLHDL  Significant Diagnostic Results in last 30 days:  No results found.  Assessment/Plan Behavioral concern Dementia Dementia with increased nocturnal symptoms, particularly during evening care. Previous concerns about over sedation with frequent Haldol  administration. Current symptoms indicate a need for medication adjustment to manage nighttime symptoms  effectively. - Administer Haldol  nightly instead of every 6 hours. - Monitor for over sedation and adjust dosage as needed. - Evaluate the effectiveness of the nightly Haldol  regimen over the next two weeks. - Consider as-needed administration if symptoms persist or worsen.  Family/ staff Communication: nursing  Labs/tests ordered:  none

## 2023-11-15 ENCOUNTER — Other Ambulatory Visit: Payer: Self-pay | Admitting: Student

## 2023-11-15 DIAGNOSIS — Z515 Encounter for palliative care: Secondary | ICD-10-CM | POA: Insufficient documentation

## 2023-11-15 MED ORDER — MORPHINE SULFATE (CONCENTRATE) 20 MG/ML PO SOLN
ORAL | 0 refills | Status: DC
Start: 2023-11-15 — End: 2023-11-20

## 2023-11-15 MED ORDER — LORAZEPAM 2 MG/ML PO CONC: 0.5000 mg | ORAL | 0 refills | Status: DC | PRN

## 2023-11-15 NOTE — Progress Notes (Signed)
 Patient with transitional breathing comfort meds provided.

## 2023-11-20 ENCOUNTER — Encounter: Payer: Self-pay | Admitting: Student

## 2023-11-20 ENCOUNTER — Non-Acute Institutional Stay (SKILLED_NURSING_FACILITY): Payer: Self-pay | Admitting: Student

## 2023-11-20 DIAGNOSIS — Z515 Encounter for palliative care: Secondary | ICD-10-CM

## 2023-11-20 MED ORDER — MORPHINE SULFATE (CONCENTRATE) 20 MG/ML PO SOLN: ORAL | 0 refills | Status: DC

## 2023-11-22 NOTE — Progress Notes (Signed)
 Location:  Other [facility].  Nursing Home Room Number: [facility] [id number] Place of Service:  SNF [phone]) Provider:  Abdul Fine, MD  Patient Care Team: Abdul Fine, MD as PCP - General [facility] Medicine)  Extended Emergency Contact Information Primary Emergency Contact: Smith,Kendal Address: [address]          [place], [place] [address] [place] Home Phone: [phone] Mobile Phone: [phone] Relation: Daughter  Code Status:  DNR Goals of care: Advanced Directive information    10/29/2023   10:40 AM  Advanced Directives  Does Patient Have a Medical Advance Directive? Yes  Type of Estate agent of [place];Living will;Out of facility DNR (pink MOST or yellow form)  Does patient want to make changes to medical advance directive? No - Patient declined  Copy of Healthcare Power of Attorney in Chart? Yes - validated most recent copy scanned in chart (See row information)     Chief Complaint  Patient presents with   End of Life Care    End of Life Care.     HPI:  Pt is a 89 y.o. male seen today for End of Life Care.   History of Present Illness The patient presents with increased difficulty breathing and decreased responsiveness. He is accompanied by his daughter.  He has been experiencing increased difficulty with breathing, described as 'gaps in the breathing' or apnea. Oxygen has been started for comfort, and medications have been adjusted to address shortness of breath. He has been sleeping more in the last day or so.  Since Friday, he has shown decreased responsiveness and has not been able to rest. His breathing rate has been noted to be in the twenties. Medications have been adjusted to be given more frequently as needed for comfort.  He has not eaten or drunk anything for about five days. His daughter mentioned that he has been moaning before reaching the two-hour mark for medication, indicating discomfort.  His  daughter and family have been present, and they have been preparing for his end-of-life care, including contacting a funeral home and saying their goodbyes. The family has noted that he seems to be working hard through this process, which is distressing for them.   Past Medical History:  Diagnosis Date   History of prostate cancer    RT 2006   Hx of colonic polyps    Hypertension    Mild cognitive impairment    OSA (obstructive sleep apnea)    on CPAP of 10   Rosacea    Tremor, essential    Wolff-Parkinson-White syndrome 1970   Past Surgical History:  Procedure Laterality Date   INTRAMEDULLARY (IM) NAIL INTERTROCHANTERIC Left 04/12/2020   Procedure: INTRAMEDULLARY (IM) NAIL INTERTROCHANTRIC;  Surgeon: Leora Lynwood SAUNDERS, MD;  Location: [facility] ORS;  Service: Orthopedics;  Laterality: Left;   TONSILLECTOMY     TOTAL KNEE ARTHROPLASTY Left 3/14    Allergies  Allergen Reactions   Lisinopril     REACTION: cough   Lactose Intolerance (Gi) Diarrhea    Outpatient Encounter Medications as of 11/20/2023  Medication Sig   atropine 1 % ophthalmic solution Place 2 drops under the tongue every 2 (two) hours as needed.   Eyelid Cleansers (OCUSOFT LID SCRUB EX) Apply to bilateral eyelids topically in the morning.   guaifenesin (ROBITUSSIN) 100 MG/5ML syrup Take 100 mg by mouth every 4 (four) hours as needed for cough.   haloperidol  (HALDOL ) 0.5 MG tablet Take 0.5 mg by mouth at bedtime as  needed for agitation.   LORazepam  (ATIVAN ) 2 MG/ML concentrated solution Take 0.3 mLs (0.6 mg total) by mouth every 4 (four) hours as needed for anxiety, seizure or sleep.   morphine  (ROXANOL) 20 MG/ML concentrated solution Take 0.25 mLs (5 mg total) by mouth every 4 (four) hours. May also take 0.25 mLs (5 mg total) every 2 (two) hours as needed for severe pain (pain score 7-10), breakthrough pain, anxiety or shortness of breath.   Olopatadine HCl (PATADAY) 0.2 % SOLN Apply to eye. Instill one drop both eye once  daily. (Patient not taking: Reported on 11/20/2023)   No facility-administered encounter medications on file as of 11/20/2023.    Review of Systems  Immunization History  Administered Date(s) Administered   Influenza, High Dose Seasonal PF 02/07/2022   Influenza, Seasonal, Injecte, Preservative Fre 02/16/2016   Influenza-Unspecified 02/09/2014, 02/21/2015, 02/15/2023   Moderna Covid-19 Fall Seasonal Vaccine 74yrs & older 07/31/2022   Moderna SARS-COV2 Booster Vaccination 09/19/2021   Moderna Sars-Covid-2 Vaccination 09/09/2020, 03/02/2022   PFIZER Comirnaty(Gray Top)Covid-19 Tri-Sucrose Vaccine 03/02/2022   Pfizer Covid-19 Vaccine Bivalent Booster 57yrs & up 01/12/2021   Pneumococcal Conjugate-13 05/19/2014   Pneumococcal Polysaccharide-23 04/23/2005   Td 04/23/2005   Tetanus 05/27/2015   Zoster Recombinant(Shingrix) 08/13/2022, 11/13/2022   Zoster, Live 05/28/2013   Pertinent  Health Maintenance Due  Topic Date Due   INFLUENZA VACCINE  11/22/2023      04/14/2020    1:00 AM 04/14/2020    8:00 AM 04/15/2020    3:00 AM 04/15/2020    7:56 AM 10/18/2022   10:13 AM  Fall Risk  Falls in the past year?     1  Was there an injury with Fall?     0  Fall Risk Category Calculator     2  (RETIRED) Patient Fall Risk Level High fall risk  High fall risk  High fall risk  High fall risk    Patient at Risk for Falls Due to     History of fall(s);Impaired mobility;Impaired vision  Fall risk Follow up     Falls evaluation completed     Data saved with a previous flowsheet row definition   Functional Status Survey:    Vitals:   11/20/23 1023 11/20/23 1035  BP: (!) 177/80 (!) 148/81  Pulse: 63   Resp: 16   Temp: (!) 97.4 F (36.3 C)   SpO2: 93%   Weight: 120 lb 3.2 oz (54.5 kg)   Height: 5' 10 (1.778 m)    Body mass index is 17.25 kg/m. Physical Exam Constitutional:      Comments: Sleeping in bed  Cardiovascular:     Pulses: Normal pulses.  Pulmonary:     Comments:  Increased work of breathing, clear lungs Skin:    General: Skin is warm.     Labs reviewed: Recent Labs    02/27/23 0000  NA 138  K 3.4*  CL 102  CO2 27*  BUN 25*  CREATININE 1.0  CALCIUM 9.2   Recent Labs    02/27/23 0000  AST 23  ALT 19  ALKPHOS 84  ALBUMIN 3.9   Recent Labs    02/27/23 0000  WBC 8.8  NEUTROABS 5,315.00  HGB 13.0*  HCT 40*   Lab Results  Component Value Date   TSH 1.22 05/12/2013   No results found for: HGBA1C No results found for: CHOL, HDL, LDLCALC, LDLDIRECT, TRIG, CHOLHDL  Significant Diagnostic Results in last 30 days:  No results found.  Assessment/Plan  Assessment and Plan End-of-life care for terminal illness He is in the terminal phase, having ceased intake for five days, with increased sleepiness and decreased responsiveness. Fixed pupils are attributed to morphine  use. Prognosis is likely within days due to cessation of intake and altered breathing patterns. The family is informed, has made arrangements, and is at peace with the situation. - Continue current end-of-life care measures - Prioritize comfort measures - Communicate with nursing staff about his condition and care needs  Respiratory distress at end of life He is experiencing increased work of breathing and respiratory distress, with respirations in the twenties. Oxygen and morphine  are used for comfort and to manage respiratory distress. The goal is to reduce respiratory effort and ensure comfort. Moisturized oxygen may be considered if mouth breathing is present. - Administer morphine  every two hours as needed for respiratory distress - Adjust morphine  dosing to every hour if necessary based on respiratory effort and comfort - Communicate with nursing staff to monitor respiratory rate and adjust medication dosing - Consider using moisturized oxygen for comfort if mouth breathing is present Family/ staff Communication: daughter, granddaughter,  nursing  Labs/tests ordered:  none

## 2023-11-22 DEATH — deceased
